# Patient Record
Sex: Female | Born: 1955 | Race: White | Hispanic: No | Marital: Married | Smoking: Former smoker
Health system: Southern US, Community
[De-identification: ages and names within clinical notes are randomized; demographics above are authoritative.]

## PROBLEM LIST (undated history)

## (undated) DIAGNOSIS — K7689 Other specified diseases of liver: Secondary | ICD-10-CM

## (undated) DIAGNOSIS — K649 Unspecified hemorrhoids: Secondary | ICD-10-CM

## (undated) DIAGNOSIS — H269 Unspecified cataract: Secondary | ICD-10-CM

## (undated) DIAGNOSIS — M1990 Unspecified osteoarthritis, unspecified site: Secondary | ICD-10-CM

## (undated) DIAGNOSIS — R202 Paresthesia of skin: Secondary | ICD-10-CM

## (undated) DIAGNOSIS — F329 Major depressive disorder, single episode, unspecified: Secondary | ICD-10-CM

## (undated) DIAGNOSIS — D126 Benign neoplasm of colon, unspecified: Secondary | ICD-10-CM

## (undated) DIAGNOSIS — L719 Rosacea, unspecified: Secondary | ICD-10-CM

## (undated) DIAGNOSIS — N3010 Interstitial cystitis (chronic) without hematuria: Secondary | ICD-10-CM

## (undated) DIAGNOSIS — E785 Hyperlipidemia, unspecified: Secondary | ICD-10-CM

## (undated) DIAGNOSIS — K219 Gastro-esophageal reflux disease without esophagitis: Secondary | ICD-10-CM

## (undated) DIAGNOSIS — T7840XA Allergy, unspecified, initial encounter: Secondary | ICD-10-CM

## (undated) DIAGNOSIS — R55 Syncope and collapse: Secondary | ICD-10-CM

## (undated) DIAGNOSIS — R918 Other nonspecific abnormal finding of lung field: Secondary | ICD-10-CM

## (undated) DIAGNOSIS — K5900 Constipation, unspecified: Secondary | ICD-10-CM

## (undated) DIAGNOSIS — R2681 Unsteadiness on feet: Secondary | ICD-10-CM

## (undated) DIAGNOSIS — D131 Benign neoplasm of stomach: Secondary | ICD-10-CM

## (undated) DIAGNOSIS — F3289 Other specified depressive episodes: Secondary | ICD-10-CM

## (undated) DIAGNOSIS — G43909 Migraine, unspecified, not intractable, without status migrainosus: Secondary | ICD-10-CM

## (undated) DIAGNOSIS — R200 Anesthesia of skin: Secondary | ICD-10-CM

## (undated) DIAGNOSIS — K589 Irritable bowel syndrome without diarrhea: Secondary | ICD-10-CM

## (undated) DIAGNOSIS — F419 Anxiety disorder, unspecified: Secondary | ICD-10-CM

## (undated) DIAGNOSIS — D649 Anemia, unspecified: Secondary | ICD-10-CM

## (undated) DIAGNOSIS — N329 Bladder disorder, unspecified: Secondary | ICD-10-CM

## (undated) HISTORY — DX: Anesthesia of skin: R20.0

## (undated) HISTORY — DX: Benign neoplasm of stomach: D13.1

## (undated) HISTORY — DX: Gastro-esophageal reflux disease without esophagitis: K21.9

## (undated) HISTORY — DX: Rosacea, unspecified: L71.9

## (undated) HISTORY — DX: Interstitial cystitis (chronic) without hematuria: N30.10

## (undated) HISTORY — PX: LAPAROSCOPY: SHX197

## (undated) HISTORY — DX: Anesthesia of skin: R20.2

## (undated) HISTORY — DX: Benign neoplasm of colon, unspecified: D12.6

## (undated) HISTORY — PX: OOPHORECTOMY: SHX86

## (undated) HISTORY — DX: Migraine, unspecified, not intractable, without status migrainosus: G43.909

## (undated) HISTORY — DX: Anemia, unspecified: D64.9

## (undated) HISTORY — PX: UPPER GASTROINTESTINAL ENDOSCOPY: SHX188

## (undated) HISTORY — DX: Irritable bowel syndrome, unspecified: K58.9

## (undated) HISTORY — PX: CYSTECTOMY: SHX5119

## (undated) HISTORY — DX: Major depressive disorder, single episode, unspecified: F32.9

## (undated) HISTORY — DX: Unspecified hemorrhoids: K64.9

## (undated) HISTORY — DX: Allergy, unspecified, initial encounter: T78.40XA

## (undated) HISTORY — DX: Anxiety disorder, unspecified: F41.9

## (undated) HISTORY — DX: Unspecified cataract: H26.9

## (undated) HISTORY — DX: Bladder disorder, unspecified: N32.9

## (undated) HISTORY — DX: Other nonspecific abnormal finding of lung field: R91.8

## (undated) HISTORY — DX: Hyperlipidemia, unspecified: E78.5

## (undated) HISTORY — DX: Other specified diseases of liver: K76.89

## (undated) HISTORY — PX: DILATION AND CURETTAGE OF UTERUS: SHX78

## (undated) HISTORY — DX: Syncope and collapse: R55

## (undated) HISTORY — PX: VAGINAL HYSTERECTOMY: SUR661

## (undated) HISTORY — DX: Constipation, unspecified: K59.00

## (undated) HISTORY — DX: Unspecified osteoarthritis, unspecified site: M19.90

## (undated) HISTORY — PX: OTHER SURGICAL HISTORY: SHX169

## (undated) HISTORY — DX: Other specified depressive episodes: F32.89

## (undated) HISTORY — DX: Unsteadiness on feet: R26.81

---

## 1997-12-04 ENCOUNTER — Other Ambulatory Visit: Admission: RE | Admit: 1997-12-04 | Discharge: 1997-12-04 | Payer: Self-pay | Admitting: *Deleted

## 1997-12-18 ENCOUNTER — Other Ambulatory Visit: Admission: RE | Admit: 1997-12-18 | Discharge: 1997-12-18 | Payer: Self-pay | Admitting: *Deleted

## 1998-02-27 ENCOUNTER — Other Ambulatory Visit: Admission: RE | Admit: 1998-02-27 | Discharge: 1998-02-27 | Payer: Self-pay | Admitting: *Deleted

## 1999-07-07 ENCOUNTER — Ambulatory Visit (HOSPITAL_BASED_OUTPATIENT_CLINIC_OR_DEPARTMENT_OTHER): Admission: RE | Admit: 1999-07-07 | Discharge: 1999-07-07 | Payer: Self-pay | Admitting: Orthopedic Surgery

## 1999-07-26 ENCOUNTER — Other Ambulatory Visit: Admission: RE | Admit: 1999-07-26 | Discharge: 1999-07-26 | Payer: Self-pay | Admitting: *Deleted

## 1999-07-26 ENCOUNTER — Encounter: Admission: RE | Admit: 1999-07-26 | Discharge: 1999-07-26 | Payer: Self-pay | Admitting: *Deleted

## 1999-07-26 ENCOUNTER — Encounter: Payer: Self-pay | Admitting: *Deleted

## 2000-07-25 ENCOUNTER — Encounter: Admission: RE | Admit: 2000-07-25 | Discharge: 2000-07-25 | Payer: Self-pay | Admitting: *Deleted

## 2000-07-25 ENCOUNTER — Other Ambulatory Visit: Admission: RE | Admit: 2000-07-25 | Discharge: 2000-07-25 | Payer: Self-pay | Admitting: *Deleted

## 2000-07-25 ENCOUNTER — Encounter: Payer: Self-pay | Admitting: *Deleted

## 2001-06-20 ENCOUNTER — Encounter: Payer: Self-pay | Admitting: *Deleted

## 2001-06-20 ENCOUNTER — Encounter: Admission: RE | Admit: 2001-06-20 | Discharge: 2001-06-20 | Payer: Self-pay | Admitting: *Deleted

## 2001-07-30 ENCOUNTER — Other Ambulatory Visit: Admission: RE | Admit: 2001-07-30 | Discharge: 2001-07-30 | Payer: Self-pay | Admitting: *Deleted

## 2001-08-10 ENCOUNTER — Other Ambulatory Visit: Admission: RE | Admit: 2001-08-10 | Discharge: 2001-08-10 | Payer: Self-pay | Admitting: *Deleted

## 2002-07-30 ENCOUNTER — Encounter: Admission: RE | Admit: 2002-07-30 | Discharge: 2002-07-30 | Payer: Self-pay | Admitting: *Deleted

## 2002-07-30 ENCOUNTER — Encounter: Payer: Self-pay | Admitting: *Deleted

## 2002-07-30 ENCOUNTER — Other Ambulatory Visit: Admission: RE | Admit: 2002-07-30 | Discharge: 2002-07-30 | Payer: Self-pay | Admitting: *Deleted

## 2002-12-16 ENCOUNTER — Ambulatory Visit (HOSPITAL_COMMUNITY): Admission: RE | Admit: 2002-12-16 | Discharge: 2002-12-16 | Payer: Self-pay | Admitting: Gastroenterology

## 2002-12-16 ENCOUNTER — Encounter: Payer: Self-pay | Admitting: Gastroenterology

## 2002-12-20 ENCOUNTER — Encounter: Payer: Self-pay | Admitting: Gastroenterology

## 2003-07-11 ENCOUNTER — Other Ambulatory Visit: Admission: RE | Admit: 2003-07-11 | Discharge: 2003-07-11 | Payer: Self-pay | Admitting: *Deleted

## 2003-07-14 ENCOUNTER — Encounter: Payer: Self-pay | Admitting: Gastroenterology

## 2003-07-14 ENCOUNTER — Encounter: Admission: RE | Admit: 2003-07-14 | Discharge: 2003-07-14 | Payer: Self-pay | Admitting: *Deleted

## 2003-12-29 ENCOUNTER — Ambulatory Visit (HOSPITAL_COMMUNITY): Admission: RE | Admit: 2003-12-29 | Discharge: 2003-12-29 | Payer: Self-pay | Admitting: Gastroenterology

## 2004-07-20 ENCOUNTER — Other Ambulatory Visit: Admission: RE | Admit: 2004-07-20 | Discharge: 2004-07-20 | Payer: Self-pay | Admitting: *Deleted

## 2004-07-28 ENCOUNTER — Ambulatory Visit (HOSPITAL_COMMUNITY): Admission: RE | Admit: 2004-07-28 | Discharge: 2004-07-28 | Payer: Self-pay | Admitting: *Deleted

## 2004-09-07 ENCOUNTER — Ambulatory Visit: Payer: Self-pay | Admitting: Gastroenterology

## 2004-09-10 ENCOUNTER — Ambulatory Visit (HOSPITAL_COMMUNITY): Admission: RE | Admit: 2004-09-10 | Discharge: 2004-09-10 | Payer: Self-pay | Admitting: Gastroenterology

## 2004-09-17 ENCOUNTER — Ambulatory Visit: Payer: Self-pay | Admitting: Gastroenterology

## 2004-09-18 ENCOUNTER — Ambulatory Visit (HOSPITAL_COMMUNITY): Admission: RE | Admit: 2004-09-18 | Discharge: 2004-09-18 | Payer: Self-pay | Admitting: Gastroenterology

## 2004-10-12 ENCOUNTER — Ambulatory Visit: Payer: Self-pay | Admitting: Gastroenterology

## 2004-11-18 ENCOUNTER — Encounter: Payer: Self-pay | Admitting: Gastroenterology

## 2004-12-31 ENCOUNTER — Ambulatory Visit: Payer: Self-pay | Admitting: Gastroenterology

## 2005-03-22 ENCOUNTER — Ambulatory Visit: Payer: Self-pay | Admitting: Gastroenterology

## 2005-05-31 ENCOUNTER — Ambulatory Visit: Payer: Self-pay | Admitting: Gastroenterology

## 2005-07-26 ENCOUNTER — Other Ambulatory Visit: Admission: RE | Admit: 2005-07-26 | Discharge: 2005-07-26 | Payer: Self-pay | Admitting: *Deleted

## 2005-08-02 ENCOUNTER — Ambulatory Visit (HOSPITAL_COMMUNITY): Admission: RE | Admit: 2005-08-02 | Discharge: 2005-08-02 | Payer: Self-pay | Admitting: *Deleted

## 2006-03-14 ENCOUNTER — Ambulatory Visit: Payer: Self-pay | Admitting: Gastroenterology

## 2006-03-21 ENCOUNTER — Ambulatory Visit: Payer: Self-pay | Admitting: Gastroenterology

## 2006-04-28 ENCOUNTER — Ambulatory Visit: Payer: Self-pay | Admitting: Gastroenterology

## 2006-04-28 ENCOUNTER — Encounter (INDEPENDENT_AMBULATORY_CARE_PROVIDER_SITE_OTHER): Payer: Self-pay | Admitting: *Deleted

## 2006-07-25 ENCOUNTER — Ambulatory Visit (HOSPITAL_COMMUNITY): Admission: RE | Admit: 2006-07-25 | Discharge: 2006-07-25 | Payer: Self-pay | Admitting: *Deleted

## 2006-07-25 ENCOUNTER — Other Ambulatory Visit: Admission: RE | Admit: 2006-07-25 | Discharge: 2006-07-25 | Payer: Self-pay | Admitting: *Deleted

## 2006-07-25 ENCOUNTER — Encounter: Payer: Self-pay | Admitting: Gastroenterology

## 2007-04-27 ENCOUNTER — Ambulatory Visit: Payer: Self-pay | Admitting: Gastroenterology

## 2007-04-27 LAB — CONVERTED CEMR LAB
Albumin: 4.1 g/dL (ref 3.5–5.2)
Amylase: 95 units/L (ref 27–131)
Basophils Absolute: 0 10*3/uL (ref 0.0–0.1)
Bilirubin, Direct: 0.1 mg/dL (ref 0.0–0.3)
Eosinophils Absolute: 0 10*3/uL (ref 0.0–0.6)
GFR calc Af Amer: 96 mL/min
GFR calc non Af Amer: 80 mL/min
Glucose, Bld: 69 mg/dL — ABNORMAL LOW (ref 70–99)
HCT: 37.4 % (ref 36.0–46.0)
Hemoglobin: 12.6 g/dL (ref 12.0–15.0)
Lymphocytes Relative: 37.8 % (ref 12.0–46.0)
MCHC: 33.7 g/dL (ref 30.0–36.0)
MCV: 84.6 fL (ref 78.0–100.0)
Monocytes Absolute: 0.7 10*3/uL (ref 0.2–0.7)
Neutro Abs: 3.3 10*3/uL (ref 1.4–7.7)
Neutrophils Relative %: 50.6 % (ref 43.0–77.0)
Potassium: 5.2 meq/L — ABNORMAL HIGH (ref 3.5–5.1)
RBC: 4.42 M/uL (ref 3.87–5.11)
Sed Rate: 20 mm/hr (ref 0–25)
Sodium: 142 meq/L (ref 135–145)
TSH: 0.91 microintl units/mL (ref 0.35–5.50)

## 2007-05-02 ENCOUNTER — Ambulatory Visit (HOSPITAL_COMMUNITY): Admission: RE | Admit: 2007-05-02 | Discharge: 2007-05-02 | Payer: Self-pay | Admitting: Gastroenterology

## 2007-07-18 ENCOUNTER — Other Ambulatory Visit: Admission: RE | Admit: 2007-07-18 | Discharge: 2007-07-18 | Payer: Self-pay | Admitting: *Deleted

## 2007-07-18 ENCOUNTER — Ambulatory Visit (HOSPITAL_COMMUNITY): Admission: RE | Admit: 2007-07-18 | Discharge: 2007-07-18 | Payer: Self-pay | Admitting: *Deleted

## 2007-10-22 DIAGNOSIS — K5900 Constipation, unspecified: Secondary | ICD-10-CM | POA: Insufficient documentation

## 2007-10-22 DIAGNOSIS — K219 Gastro-esophageal reflux disease without esophagitis: Secondary | ICD-10-CM | POA: Insufficient documentation

## 2007-10-22 DIAGNOSIS — N3010 Interstitial cystitis (chronic) without hematuria: Secondary | ICD-10-CM | POA: Insufficient documentation

## 2007-10-22 DIAGNOSIS — K589 Irritable bowel syndrome without diarrhea: Secondary | ICD-10-CM | POA: Insufficient documentation

## 2007-10-22 DIAGNOSIS — F32A Depression, unspecified: Secondary | ICD-10-CM | POA: Insufficient documentation

## 2007-10-22 DIAGNOSIS — K7689 Other specified diseases of liver: Secondary | ICD-10-CM | POA: Insufficient documentation

## 2007-10-22 DIAGNOSIS — F329 Major depressive disorder, single episode, unspecified: Secondary | ICD-10-CM

## 2008-05-23 ENCOUNTER — Encounter: Payer: Self-pay | Admitting: Gastroenterology

## 2008-07-07 ENCOUNTER — Telehealth: Payer: Self-pay | Admitting: Gastroenterology

## 2008-07-14 ENCOUNTER — Other Ambulatory Visit: Admission: RE | Admit: 2008-07-14 | Discharge: 2008-07-14 | Payer: Self-pay | Admitting: Gynecology

## 2008-07-14 ENCOUNTER — Encounter: Payer: Self-pay | Admitting: Gastroenterology

## 2008-07-14 ENCOUNTER — Ambulatory Visit (HOSPITAL_COMMUNITY): Admission: RE | Admit: 2008-07-14 | Discharge: 2008-07-14 | Payer: Self-pay | Admitting: Gynecology

## 2008-08-12 ENCOUNTER — Ambulatory Visit (HOSPITAL_COMMUNITY): Admission: RE | Admit: 2008-08-12 | Discharge: 2008-08-12 | Payer: Self-pay | Admitting: Gastroenterology

## 2008-08-18 ENCOUNTER — Telehealth: Payer: Self-pay | Admitting: Gastroenterology

## 2008-08-18 DIAGNOSIS — R1032 Left lower quadrant pain: Secondary | ICD-10-CM | POA: Insufficient documentation

## 2008-08-19 ENCOUNTER — Ambulatory Visit: Payer: Self-pay | Admitting: Gastroenterology

## 2008-08-29 ENCOUNTER — Ambulatory Visit: Payer: Self-pay | Admitting: Gastroenterology

## 2008-10-29 ENCOUNTER — Ambulatory Visit (HOSPITAL_COMMUNITY): Admission: RE | Admit: 2008-10-29 | Discharge: 2008-10-29 | Payer: Self-pay | Admitting: Obstetrics and Gynecology

## 2008-10-29 ENCOUNTER — Encounter (INDEPENDENT_AMBULATORY_CARE_PROVIDER_SITE_OTHER): Payer: Self-pay | Admitting: Obstetrics and Gynecology

## 2009-07-20 ENCOUNTER — Ambulatory Visit (HOSPITAL_COMMUNITY): Admission: RE | Admit: 2009-07-20 | Discharge: 2009-07-20 | Payer: Self-pay | Admitting: Gynecology

## 2009-07-20 ENCOUNTER — Telehealth: Payer: Self-pay | Admitting: Gastroenterology

## 2009-07-20 ENCOUNTER — Encounter: Payer: Self-pay | Admitting: Gastroenterology

## 2009-08-17 ENCOUNTER — Ambulatory Visit (HOSPITAL_COMMUNITY): Admission: RE | Admit: 2009-08-17 | Discharge: 2009-08-17 | Payer: Self-pay | Admitting: Gastroenterology

## 2009-09-08 ENCOUNTER — Ambulatory Visit: Payer: Self-pay | Admitting: Gastroenterology

## 2009-09-09 LAB — CONVERTED CEMR LAB
AST: 25 units/L (ref 0–37)
Albumin: 4.4 g/dL (ref 3.5–5.2)
Alkaline Phosphatase: 74 units/L (ref 39–117)
Basophils Absolute: 0 10*3/uL (ref 0.0–0.1)
Bilirubin, Direct: 0 mg/dL (ref 0.0–0.3)
CO2: 33 meq/L — ABNORMAL HIGH (ref 19–32)
Calcium: 10 mg/dL (ref 8.4–10.5)
Eosinophils Relative: 0.8 % (ref 0.0–5.0)
Ferritin: 4.6 ng/mL — ABNORMAL LOW (ref 10.0–291.0)
Folate: 10.2 ng/mL
GFR calc non Af Amer: 92.15 mL/min (ref >60)
Glucose, Bld: 86 mg/dL (ref 70–99)
Lymphs Abs: 2.1 10*3/uL (ref 0.7–4.0)
Monocytes Absolute: 0.4 10*3/uL (ref 0.1–1.0)
Monocytes Relative: 6.8 % (ref 3.0–12.0)
Neutrophils Relative %: 55.7 % (ref 43.0–77.0)
Platelets: 243 10*3/uL (ref 150.0–400.0)
Potassium: 4.8 meq/L (ref 3.5–5.1)
RDW: 16.9 % — ABNORMAL HIGH (ref 11.5–14.6)
Saturation Ratios: 8 % — ABNORMAL LOW (ref 20.0–50.0)
Sed Rate: 28 mm/hr — ABNORMAL HIGH (ref 0–22)
Sodium: 141 meq/L (ref 135–145)
Transferrin: 356 mg/dL (ref 212.0–360.0)
WBC: 5.8 10*3/uL (ref 4.5–10.5)

## 2009-09-11 ENCOUNTER — Telehealth: Payer: Self-pay | Admitting: Gastroenterology

## 2009-09-15 ENCOUNTER — Telehealth: Payer: Self-pay | Admitting: Gastroenterology

## 2009-10-06 ENCOUNTER — Ambulatory Visit: Payer: Self-pay | Admitting: Gastroenterology

## 2009-10-06 DIAGNOSIS — K5909 Other constipation: Secondary | ICD-10-CM | POA: Insufficient documentation

## 2009-10-06 LAB — CONVERTED CEMR LAB
ALT: 18 units/L (ref 0–35)
Alkaline Phosphatase: 72 units/L (ref 39–117)
Basophils Absolute: 0 10*3/uL (ref 0.0–0.1)
Bilirubin, Direct: 0.1 mg/dL (ref 0.0–0.3)
CO2: 31 meq/L (ref 19–32)
Chloride: 101 meq/L (ref 96–112)
Creatinine, Ser: 0.7 mg/dL (ref 0.4–1.2)
Eosinophils Absolute: 0.1 10*3/uL (ref 0.0–0.7)
Iron: 46 ug/dL (ref 42–145)
Lymphocytes Relative: 29.8 % (ref 12.0–46.0)
MCHC: 32.1 g/dL (ref 30.0–36.0)
Monocytes Relative: 6.7 % (ref 3.0–12.0)
Neutro Abs: 3.4 10*3/uL (ref 1.4–7.7)
Neutrophils Relative %: 62.1 % (ref 43.0–77.0)
Platelets: 299 10*3/uL (ref 150.0–400.0)
Potassium: 4.6 meq/L (ref 3.5–5.1)
RDW: 18.5 % — ABNORMAL HIGH (ref 11.5–14.6)
Saturation Ratios: 11 % — ABNORMAL LOW (ref 20.0–50.0)
Sed Rate: 34 mm/hr — ABNORMAL HIGH (ref 0–22)
Sodium: 139 meq/L (ref 135–145)
Total Bilirubin: 0.5 mg/dL (ref 0.3–1.2)
Transferrin: 297.4 mg/dL (ref 212.0–360.0)
Vitamin B-12: 1045 pg/mL — ABNORMAL HIGH (ref 211–911)

## 2009-11-06 ENCOUNTER — Ambulatory Visit: Payer: Self-pay | Admitting: Gastroenterology

## 2009-11-06 DIAGNOSIS — K900 Celiac disease: Secondary | ICD-10-CM | POA: Insufficient documentation

## 2009-12-15 ENCOUNTER — Telehealth: Payer: Self-pay | Admitting: Gastroenterology

## 2010-07-15 ENCOUNTER — Ambulatory Visit (HOSPITAL_COMMUNITY)
Admission: RE | Admit: 2010-07-15 | Discharge: 2010-07-15 | Payer: Self-pay | Source: Home / Self Care | Attending: Gynecology | Admitting: Gynecology

## 2010-08-11 ENCOUNTER — Telehealth: Payer: Self-pay | Admitting: Gastroenterology

## 2010-08-13 ENCOUNTER — Ambulatory Visit (HOSPITAL_COMMUNITY)
Admission: RE | Admit: 2010-08-13 | Discharge: 2010-08-13 | Payer: Self-pay | Source: Home / Self Care | Attending: Gastroenterology | Admitting: Gastroenterology

## 2010-08-29 ENCOUNTER — Encounter: Payer: Self-pay | Admitting: Gynecology

## 2010-09-07 NOTE — Progress Notes (Signed)
Summary: resend refill  Phone Note Call from Patient Call back at Work Phone [phone] Call back at [phone]   Caller: Patient Call For: Jarold Motto Reason for Call: Talk to Nurse Summary of Call: Patient states that rx she requested on Tues. is till not at her pharmacy, please resend.  [phone] [facility] Initial call taken by: Tawni Levy,  September 11, 2009 8:37 AM  Follow-up for Phone Call        Left messsage for pt to call. Lupita Leash Surface RN  September 11, 2009 8:42 AM  [facility] not get any of the prescriptions that were sent earlier this week.  Request we send them again.  Req we fax RX  to [phone]. This has been done. Follow-up by: Ashok Cordia RN,  September 11, 2009 12:08 PM    Prescriptions: TANDEM 162-115.2 MG CAPS (FERROUS FUM-IRON POLYSACCH) 1 by mouth qd  #90 x 3   Entered by:   Ashok Cordia RN   Authorized by:   Mardella Layman MD [facility]   Signed by:   Ashok Cordia RN on 09/11/2009   Method used:   Printed then faxed to ...       [facility] # [phone]* (retail)       [address]       [place], [place]  [address]       Ph: [phone]       Fax: [phone]   RxID:   [id number] KLONOPIN 0.5 MG TABS (CLONAZEPAM) 1 tablet by mouth bid  #180 x 3   Entered by:   Ashok Cordia RN   Authorized by:   Mardella Layman MD [facility]   Signed by:   Ashok Cordia RN on 09/11/2009   Method used:   Printed then faxed to ...       [facility] # [phone]* (retail)       [address]       [place], [place]  [address]       Ph: [phone]       Fax: [phone]   RxID:   [id number] LEXAPRO 20 MG TABS (ESCITALOPRAM OXALATE) 1 tablet by mouth once daily  #90 x 3   Entered by:   Ashok Cordia RN   Authorized by:   Mardella Layman MD [facility]   Signed by:   Ashok Cordia RN on 09/11/2009   Method used:   Printed then faxed to ...       [facility] # [phone]* (retail)       [address]       [place],  [place]  [address]       Ph: [phone]       Fax: [phone]   RxID:   [id number]

## 2010-09-07 NOTE — Assessment & Plan Note (Signed)
Summary: Recheck/dfs   History of Present Illness Visit Type: Follow-up Visit Primary GI MD: Sheryn Bison MD FACP FAGA Primary Provider: n/a Chief Complaint: Patient comes for f/u on gerd and constipation. Patient states that she is still experiencing increased GERD (thinks maybe it is because she has been eating lots of citrus fruits). She also c/o continued constipation with increased gas. History of Present Illness:   Since her last visit this patient has had laparoscopy with lysis of adhesions and removal of her left ovary. Her constipation has improved but she continues on daily Amitiza 8 micrograms with p.r.n. MiraLax and Dulcolax. She recently tried to decrease her Lexapro from 20-10 mg a day and has had resultant arthralgias and myalgias and general malaise. She does take daily Aleve and other NSAIDs but is on Protonix 40 mg a day. For chronic anxiety syndrome she takes Klonopin 0.5 mg one to 2 times a day. She continues to have some problems with acid reflux but denies dysphagia or any specific hepatobiliary complaints. Recent repeat upper abdominal ultrasound exam shows a stable hepatic cyst.   GI Review of Systems    Reports abdominal pain, acid reflux, belching, bloating, chest pain, dysphagia with solids, heartburn, loss of appetite, and  nausea.     Location of  Abdominal pain: lower abdomen.    Denies dysphagia with liquids, vomiting, vomiting blood, weight loss, and  weight gain.      Reports constipation and  diverticulosis.     Denies anal fissure, black tarry stools, change in bowel habit, diarrhea, fecal incontinence, heme positive stool, hemorrhoids, irritable bowel syndrome, jaundice, light color stool, liver problems, rectal bleeding, and  rectal pain.    Current Medications (verified): 1)  Protonix 40 Mg  Tbec (Pantoprazole Sodium) .Marland Kitchen.. 1 Twice A Day 30 Minutes Before Meals 2)  Lexapro 20 Mg Tabs (Escitalopram Oxalate) .Marland Kitchen.. 1 Tablet By Mouth Once Daily 3)  Klonopin  0.5 Mg Tabs (Clonazepam) .Marland Kitchen.. 1 Tablet By Mouth Once Daily 4)  Miralax  Powd (Polyethylene Glycol 3350) .... Mix 1 Capful in Water At Bedtime 5)  Dulcolax 5 Mg Tbec (Bisacodyl) .... As Needed 6)  Amitiza 8 Mcg  Caps (Lubiprostone) .Marland Kitchen.. 1 Two Times A Day/take With Food and Water 7)  Aleve 220 Mg Caps (Naproxen Sodium) .... As Needed 8)  Meloxicam 15 Mg Tabs (Meloxicam) .... Take 1 Tablet By Mouth As Needed  Allergies (verified): 1)  ! Asa  Past History:  Past medical, surgical, family and social histories (including risk factors) reviewed for relevance to current acute and chronic problems.  Past Medical History: Reviewed history from 08/18/2008 and no changes required. 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 Oophorectomy  Family History: Reviewed history from 08/19/2008 and no changes required. Family History of Heart Disease: Mother Family History of Bladder Cancer: Mother Family History of Pancreatic Cancer: Paternal Grandfather Family History of Diabetes: Paternal Grandmother No FH of Colon Cancer:  Social History: Reviewed history from 08/19/2008 and no changes required. Patient is a former smoker. -stopped at age 60. Illicit Drug Use - no Occupation: Solicitor of Social Services Alcohol Use - yes-1 weekly Daily Caffeine Use Patient gets regular exercise.  Review of Systems       The patient complains of anemia, arthritis/joint pain, back pain, depression-new, fatigue, headaches-new, heart rhythm changes,  muscle pains/cramps, shortness of breath, and sleeping problems.    Vital Signs:  Patient profile:   55 year old female Height:      66 inches Weight:      162.38 pounds BMI:     26.30 BSA:      1.83 Pulse rate:   72 / minute Pulse rhythm:   regular BP sitting:   140 / 76  (right arm)  Vitals Entered By: Hortense Ramal CMA [place] [facility]) (September 08, 2009 10:35 AM)  Physical Exam  General:  Well developed, well nourished, no acute distress.healthy appearing.   Head:  Normocephalic and atraumatic. Eyes:  exam deferred to patient's ophthalmologist.   Neck:  Supple; no masses or thyromegaly. Lungs:  Clear throughout to auscultation. Heart:  Regular rate and rhythm; no murmurs, rubs,  or bruits. Abdomen:  Soft, nontender and nondistended. No masses, hepatosplenomegaly or hernias noted. Normal bowel sounds. Msk:  Symmetrical with no gross deformities. Normal posture. Pulses:  Normal pulses noted. Extremities:  No clubbing, cyanosis, edema or deformities noted. Neurologic:  Alert and  oriented x4;  grossly normal neurologically. Cervical Nodes:  No significant cervical adenopathy. Inguinal Nodes:  No significant inguinal adenopathy. Psych:  Alert and cooperative. Normal mood and affect.   Impression & Recommendations:  Problem # 1:  ABDOMINAL PAIN, LEFT LOWER QUADRANT (ICD-789.04) Assessment Improved Continue constipation regime of all medications as listed and we'll try [facility] b.i.d. Labs have been ordered for review.  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  Problem # 3:  DEPRESSION (ICD-311) Assessment: Deteriorated Increase Lexapro to 20 mg a day with p.r.n. Klonopin use. Orders: TLB-CBC Platelet - w/Differential (85025-CBCD) TLB-BMP (Basic Metabolic Panel-BMET) (80048-METABOL) TLB-Hepatic/Liver Function Pnl (80076-HEPATIC) TLB-TSH (Thyroid Stimulating Hormone) (84443-TSH) TLB-B12, Serum-Total ONLY (21308-M57) TLB-Ferritin (82728-FER) TLB-Folic Acid (Folate) (82746-FOL) TLB-IBC Pnl (Iron/FE;Transferrin) (83550-IBC) TLB-Sedimentation Rate (ESR) (85652-ESR)  Problem # 4:  GERD (ICD-530.81) Assessment:  Improved Continue Reflux regime and daily PPI therapy. Orders: TLB-CBC Platelet - w/Differential (85025-CBCD) TLB-BMP (Basic Metabolic Panel-BMET) (80048-METABOL) TLB-Hepatic/Liver Function Pnl (80076-HEPATIC) TLB-TSH (Thyroid Stimulating Hormone) (84443-TSH) TLB-B12, Serum-Total ONLY (84696-E95) TLB-Ferritin (82728-FER) TLB-Folic Acid (Folate) (82746-FOL) TLB-IBC Pnl (Iron/FE;Transferrin) (83550-IBC) TLB-Sedimentation Rate (ESR) (85652-ESR)  Patient Instructions: 1)  Refills will be sent to your pharmacy. 2)  Please schedule a follow-up appointment in 1 month.  3)  Labs will be drawn today. 4)  The medication list was reviewed and reconciled.  All changed / newly prescribed medications were explained.  A complete medication list was provided to the patient / caregiver. 5)  Please continue current medications.  6)  Avoid foods high in acid content ( tomatoes, citrus juices, spicy foods) . Avoid eating within 3 to 4 hours of lying down or before exercising. Do not over eat; try smaller more frequent meals. Elevate head of bed four inches when sleeping.  7)  Constipation and Hemorrhoids brochure given.   Appended Document: Recheck/dfs    Clinical Lists Changes  Medications: Changed medication from AMITIZA 8 MCG  CAPS (LUBIPROSTONE) 1 two times a day/take with food and water to AMITIZA 8 MCG  CAPS (LUBIPROSTONE) 1 two times a day/take with food and water - Signed Changed medication from LEXAPRO 20 MG TABS (ESCITALOPRAM OXALATE) 1 tablet by mouth once daily to LEXAPRO 20 MG TABS (ESCITALOPRAM OXALATE) 1 tablet by mouth once daily - Signed Changed medication from KLONOPIN 0.5 MG TABS (CLONAZEPAM) 1 tablet by mouth once daily to KLONOPIN 0.5 MG TABS (CLONAZEPAM) 1 tablet  by mouth once daily - Signed Changed medication from KLONOPIN 0.5 MG TABS (CLONAZEPAM) 1 tablet by mouth once daily to KLONOPIN 0.5 MG TABS (CLONAZEPAM) 1 tablet by mouth bid - Signed Rx of AMITIZA 8 MCG  CAPS  (LUBIPROSTONE) 1 two times a day/take with food and water;  #60 x 11;  Signed;  Entered by: Ashok Cordia RN;  Authorized by: Mardella Layman MD [facility];  Method used: Electronically to [facility] # [phone]*, [address], [place], [place]  [address], Ph: [phone], Fax: [phone] Rx of LEXAPRO 20 MG TABS (ESCITALOPRAM OXALATE) 1 tablet by mouth once daily;  #90 x 3;  Signed;  Entered by: Ashok Cordia RN;  Authorized by: Mardella Layman MD [facility];  Method used: Faxed to [facility], , , [place]  , Ph: [phone], Fax: [phone] Rx of KLONOPIN 0.5 MG TABS (CLONAZEPAM) 1 tablet by mouth once daily;  #180 x 3;  Signed;  Entered by: Ashok Cordia RN;  Authorized by: Mardella Layman MD [facility];  Method used: Printed then faxed to [facility], , ,   , Ph: [phone], Fax: [phone] Rx of KLONOPIN 0.5 MG TABS (CLONAZEPAM) 1 tablet by mouth bid;  #180 x 3;  Signed;  Entered by: Ashok Cordia RN;  Authorized by: Mardella Layman MD [facility];  Method used: Printed then faxed to [facility], , , [place]  , Ph: [phone], Fax: [phone]    Prescriptions: Scarlette Calico 0.5 MG TABS (CLONAZEPAM) 1 tablet by mouth bid  #180 x 3   Entered by:   Ashok Cordia RN   Authorized by:   Mardella Layman MD [facility]   Signed by:   Ashok Cordia RN on 09/08/2009   Method used:   Printed then faxed to ...       [facility] [employer] [employer])             , [place]         Ph: [phone]       Fax: [phone]   RxID:   [id number] KLONOPIN 0.5 MG TABS (CLONAZEPAM) 1 tablet by mouth once daily  #180 x 3   Entered by:   Ashok Cordia RN   Authorized by:   Mardella Layman MD [facility]   Signed by:   Ashok Cordia RN on 09/08/2009   Method used:   Printed then faxed to ...       [facility] [employer])             , [place]         Ph: [phone]       Fax: [phone]   RxID:   [id number] LEXAPRO 20 MG TABS (ESCITALOPRAM OXALATE) 1 tablet by mouth once daily  #90 x 3   Entered by:   Ashok Cordia  RN   Authorized by:   Mardella Layman MD [facility]   Signed by:   Ashok Cordia RN on 09/08/2009   Method used:   Faxed to ...       [facility] [employer])             , [place]         Ph: [phone]       Fax: [phone]   RxID:   [id number] AMITIZA 8 MCG  CAPS (LUBIPROSTONE) 1 two times a day/take with food and water  #60 x 11   Entered by:   Ashok Cordia RN   Authorized by:   Mardella Layman MD [facility]   Signed  by:   Ashok Cordia RN on 09/08/2009   Method used:   Electronically to        [facility] # [id number]* (retail)       [address]       [place], [place]  [address]       Ph: [phone]       Fax: [phone]   RxID:   [id number]   Klonopin rx printed in error.  two times a day is correct dose.

## 2010-09-07 NOTE — Assessment & Plan Note (Signed)
Summary: F/U APPT...LSW.   History of Present Illness Visit Type: Follow-up Visit Primary GI MD: Sheryn Bison MD FACP FAGA Primary Provider: Teodora Medici, MD Requesting Provider: n/a Chief Complaint: f/u constipation. Pt is on a new probiotic from the health food store. Pt states she is doing very good with that. Pt states she has regular stools now almost every day.  History of Present Illness:   55 year old Caucasian female with probable celiac disease per abnormal celiac serology. She had been placed in a gluten-free diet and her GI symptoms have been alleviated. She does take probiotics from a health food store. Other medications currently are Protonix 40 mg twice a day for GERD, Lexapro 20 mg a day, Klonopin 0.5 mg twice a day,p.r.n. Amitiza 8 micrograms b.i.d.   GI Review of Systems      Denies abdominal pain, acid reflux, belching, bloating, chest pain, dysphagia with liquids, dysphagia with solids, heartburn, loss of appetite, nausea, vomiting, vomiting blood, weight loss, and  weight gain.        Denies anal fissure, black tarry stools, change in bowel habit, constipation, diarrhea, diverticulosis, fecal incontinence, heme positive stool, hemorrhoids, irritable bowel syndrome, jaundice, light color stool, liver problems, rectal bleeding, and  rectal pain.    Current Medications (verified): 1)  Protonix 40 Mg  Tbec (Pantoprazole Sodium) .Marland Kitchen.. 1 Twice A Day 30 Minutes Before Meals 2)  Lexapro 20 Mg Tabs (Escitalopram Oxalate) .Marland Kitchen.. 1 Tablet By Mouth Once Daily 3)  Klonopin 0.5 Mg Tabs (Clonazepam) .Marland Kitchen.. 1 Tablet By Mouth Bid 4)  Dulcolax 5 Mg Tbec (Bisacodyl) .... As Needed 5)  Amitiza 8 Mcg  Caps (Lubiprostone) .Marland Kitchen.. 1 Two Times A Day/take With Food and Water 6)  Meloxicam 15 Mg Tabs (Meloxicam) .... Take 1 Tablet By Mouth As Needed 7)  Tandem 162-115.2 Mg Caps (Ferrous Fum-Iron Polysacch) .Marland Kitchen.. 1 By Mouth Qd 8)  First-Bxn Mouthwash  Susp (Diphenhyd-Lidocaine-Nystatin) .... 5 Cc  Swish and Swallow Qid 9)  [facility]  Caps (Probiotic Product) .... Tid 10)  Ultra Complex Probiotic 50 .... One Tablet By Mouth Once Daily  Allergies (verified): 1)  ! Asa  Past History:  Past medical, surgical, family and social histories (including risk factors) reviewed for relevance to current acute and chronic problems.  Past Medical History: Reviewed history from 08/18/2008 and no changes required. Current Problems:  ABDOMINAL PAIN, LEFT LOWER QUADRANT (ICD-789.04) HEPATIC CYST (ICD-573.8) INTERSTITIAL CYSTITIS (ICD-595.1) DEPRESSION (ICD-311) GERD (ICD-530.81) CONSTIPATION (ICD-564.00) IBS (ICD-564.1)  Past Surgical History: Reviewed history from 09/08/2009 and no changes required. milk gland removed  Hysterectomy and laparoscopic lysis of pelvic adhesions. Oophorectomy  Family History: Reviewed history from 09/08/2009 and no changes required. Family History of Heart Disease: Mother Family History of Bladder Cancer: Mother Family History of Pancreatic Cancer: Paternal Grandfather Family History of Diabetes: Paternal Grandmother No FH of Colon Cancer:  Social History: Reviewed history from 09/08/2009 and no changes required. Patient is a former smoker. -stopped at age 48. Illicit Drug Use - no Occupation: Solicitor of Social Services Alcohol Use - yes-1 weekly Daily Caffeine Use Patient gets regular exercise.  Review of Systems  The patient denies allergy/sinus, anemia, anxiety-new, arthritis/joint pain, back pain, blood in urine, breast changes/lumps, change in vision, confusion, cough, coughing up blood, depression-new, fainting, fatigue, fever, headaches-new, hearing problems, heart murmur, heart rhythm changes, itching, menstrual pain, muscle pains/cramps, night sweats, nosebleeds, pregnancy symptoms, shortness of breath, skin rash, sleeping problems, sore throat, swelling of feet/legs, swollen lymph glands, thirst -  excessive , urination - excessive ,  urination changes/pain, urine leakage, vision changes, and voice change.    Vital Signs:  Patient profile:   55 year old female Height:      66 inches Weight:      162.50 pounds BMI:     26.32 Pulse rate:   70 / minute Pulse rhythm:   regular BP sitting:   142 / 70  (right arm) Cuff size:   regular  Vitals Entered By: Christie Nottingham CMA [place] [facility]) (November 06, 2009 9:22 AM)  Physical Exam  General:  Well developed, well nourished, no acute distress.healthy appearing.   Head:  Normocephalic and atraumatic. Eyes:  PERRLA, no icterus.exam deferred to patient's ophthalmologist.   Psych:  Alert and cooperative. Normal mood and affect.   Impression & Recommendations:  Problem # 1:  CELIAC SPRUE (ICD-579.0) Assessment Improved Continue gluten-free diet. Referral to dietary has been offered to the patient but she declined .We have given her permission all celiac disease and a copy of our gluten-free diet. I have asked her to take Caltrate 600 with vitamin D 2 tablets a day, multivitamins, and probiotics per her discretion. She uses Amitiza also on p.r.n. basis.  Problem # 2:  GERD (ICD-530.81) Assessment: Improved Decrease Protonix to 40 mg q.a.m. as tolerated.  Problem # 3:  DEPRESSION (ICD-311) Assessment: Improved continue Lexapro and Klonopin as tolerated.  Patient Instructions: 1)  Begin Caltrate 600 mg two times a day. 2)  Celiac Sprue brochure given.  3)  The medication list was reviewed and reconciled.  All changed / newly prescribed medications were explained.  A complete medication list was provided to the patient / caregiver. 4)  Copy sent to : Dr. Teodora Medici in gynecology. 5)  Please continue current medications.  6)  Please schedule a follow-up appointment as needed.

## 2010-09-07 NOTE — Assessment & Plan Note (Signed)
Summary: 55-MONTH F/U APPT...LSW.   History of Present Illness Visit Type: Follow-up Visit Primary GI MD: Sheryn Bison MD FACP FAGA Primary Provider: Teodora Medici, MD Requesting Provider: n/a Chief Complaint: One month f/u for constipation, and GERD.  Pt states that she feels better but still c/o constipation  History of Present Illness:   She apparently had respire toward flu several weeks ago and took Tamiflu for 5 days. This is worse and her nausea, sense of taste, constipation, shortness of breath, and caused her to be chronically fatigued. Her GI complaints have not changed whatsoever. Medications are listed and reviewed and include twice a day Amitiza 8 micrograms of Protonix 40 mg a day. 4 anxiety depression she is on Lexapro 20 mg a day and Klonopin 0.5 mg twice a day. She has mild iron deficiency we started her on daily tandem. Followup ultrasound showed no progression of her benign liver cyst.  She denies melena or medically easy. I reviewed her records in detail and she is up-to-date on her colonoscopy and endoscopy exams. She previously smoked but does not currently use cigarettes or abuse alcohol or NSAIDs.   GI Review of Systems      Denies abdominal pain, acid reflux, belching, bloating, chest pain, dysphagia with liquids, dysphagia with solids, heartburn, loss of appetite, nausea, vomiting, vomiting blood, weight loss, and  weight gain.      Reports constipation.     Denies anal fissure, black tarry stools, change in bowel habit, diarrhea, diverticulosis, fecal incontinence, heme positive stool, hemorrhoids, irritable bowel syndrome, jaundice, light color stool, liver problems, rectal bleeding, and  rectal pain.    Current Medications (verified): 1)  Protonix 40 Mg  Tbec (Pantoprazole Sodium) .Marland Kitchen.. 1 Twice A Day 30 Minutes Before Meals 2)  Lexapro 20 Mg Tabs (Escitalopram Oxalate) .Marland Kitchen.. 1 Tablet By Mouth Once Daily 3)  Klonopin 0.5 Mg Tabs (Clonazepam) .Marland Kitchen.. 1 Tablet By Mouth  Bid 4)  Dulcolax 5 Mg Tbec (Bisacodyl) .... As Needed 5)  Amitiza 8 Mcg  Caps (Lubiprostone) .Marland Kitchen.. 1 Two Times A Day/take With Food and Water 6)  Meloxicam 15 Mg Tabs (Meloxicam) .... Take 1 Tablet By Mouth As Needed 7)  Tandem 162-115.2 Mg Caps (Ferrous Fum-Iron Polysacch) .Marland Kitchen.. 1 By Mouth Qd  Allergies (verified): 1)  ! Asa  Past History:  Past medical, surgical, family and social histories (including risk factors) reviewed for relevance to current acute and chronic problems.  Past Medical History: Reviewed history from 08/18/2008 and no changes required. Current Problems:  ABDOMINAL PAIN, LEFT LOWER QUADRANT (ICD-789.04) HEPATIC CYST (ICD-573.8) INTERSTITIAL CYSTITIS (ICD-595.1) DEPRESSION (ICD-311) GERD (ICD-530.81) CONSTIPATION (ICD-564.00) IBS (ICD-564.1)  Past Surgical History: Reviewed history from 09/08/2009 and no changes required. milk gland removed  Hysterectomy and laparoscopic lysis of pelvic adhesions. Oophorectomy  Family History: Reviewed history from 09/08/2009 and no changes required. Family History of Heart Disease: Mother Family History of Bladder Cancer: Mother Family History of Pancreatic Cancer: Paternal Grandfather Family History of Diabetes: Paternal Grandmother No FH of Colon Cancer:  Social History: Reviewed history from 09/08/2009 and no changes required. Patient is a former smoker. -stopped at age 43. Illicit Drug Use - no Occupation: S[employer] Alcohol Use - yes-1 weekly Daily Caffeine Use Patient gets regular exercise.  Review of Systems       The patient complains of allergy/sinus, anemia, anxiety-new, arthritis/joint pain, back pain, fatigue, muscle pains/cramps, and sleeping problems.  The patient denies blood in urine, breast changes/lumps, change in vision, confusion,  cough, coughing up blood, depression-new, fainting, fever, headaches-new, hearing problems, heart murmur, heart rhythm changes, itching, menstrual pain,  night sweats, nosebleeds, pregnancy symptoms, shortness of breath, skin rash, sore throat, swelling of feet/legs, swollen lymph glands, thirst - excessive , urination - excessive , urination changes/pain, urine leakage, vision changes, and voice change.         She complains of deconditioning with shortness of breath with exertion, but she denies any specific cardiopulmonary complaints otherwise. Her previous" flulike expiratory problems" seem to have resolved and she does not have purulent sputum. General:  Complains of weakness and malaise; denies fever, chills, sweats, anorexia, fatigue, weight loss, and sleep disorder. CV:  Complains of dyspnea on exertion; denies chest pains, angina, palpitations, syncope, orthopnea, PND, peripheral edema, and claudication. Resp:  Complains of dyspnea with exercise; denies dyspnea at rest, cough, sputum, wheezing, coughing up blood, and pleurisy. GI:  Complains of constipation; denies difficulty swallowing, pain on swallowing, nausea, indigestion/heartburn, vomiting, vomiting blood, abdominal pain, jaundice, gas/bloating, diarrhea, change in bowel habits, bloody BM's, black BMs, and fecal incontinence. Derm:  Denies rash, itching, dry skin, hives, moles, warts, and unhealing ulcers. Neuro:  Denies weakness, paralysis, abnormal sensation, seizures, syncope, tremors, vertigo, transient blindness, frequent falls, frequent headaches, difficulty walking, headache, sciatica, radiculopathy other:, restless legs, memory loss, and confusion. Psych:  Complains of depression and anxiety; denies memory loss, suicidal ideation, hallucinations, paranoia, phobia, and confusion. Heme:  Denies bruising, bleeding, enlarged lymph nodes, and pagophagia. Allergy:  Denies hives, rash, sneezing, hay fever, and recurrent infections.  Vital Signs:  Patient profile:   55 year old female Height:      66 inches Weight:      162 pounds BMI:     26.24 BSA:     1.83 Pulse rate:   68 /  minute Pulse rhythm:   regular BP sitting:   124 / 80  (left arm) Cuff size:   regular  Vitals Entered By: Ok Anis CMA (October 06, 2009 9:37 AM)  Physical Exam  General:  Well developed, well nourished, no acute distress.healthy appearing.   Head:  Normocephalic and atraumatic. Eyes:  PERRLA, no icterus.exam deferred to patient's ophthalmologist.   Mouth:  No deformity or lesions, dentition normal. Neck:  Supple; no masses or thyromegaly. Lungs:  Clear throughout to auscultation. Heart:  Regular rate and rhythm; no murmurs, rubs,  or bruits. Abdomen:  Soft, nontender and nondistended. No masses, hepatosplenomegaly or hernias noted. Normal bowel sounds. Extremities:  No clubbing, cyanosis, edema or deformities noted. Neurologic:  Alert and  oriented x4;  grossly normal neurologically. Skin:  Intact without significant lesions or rashes. Cervical Nodes:  No significant cervical adenopathy. Psych:  Alert and cooperative. Normal mood and affect.   Impression & Recommendations:  Problem # 1:  CONSTIPATION (ICD-564.00) Assessment Unchanged Continue Current regime and will again ask her to take [facility] t.i.d. Also Will Try Dukes Magic mouthwash swish and swallow 5 cc q.i.d. I suspect some of her current symptomatology is related to previous Tamiflu prescription. We will repeat labs and vital and level today per her symptoms of taste and smell distortion. She has some mild symptoms of gastroparesis which carefully will improve. Certainly no evidence of chronic restart her problems on exam today.  Problem # 2:  ABDOMINAL PAIN, LEFT LOWER QUADRANT (ICD-789.04) Assessment: Improved  Problem # 3:  DEPRESSION (ICD-311) Assessment: Improved continue current medicines as reviewed.  Problem # 4:  GERD (ICD-530.81) Assessment: Improved Continue reflex exam  and daily protonix 40 mg Orders: TLB-CBC Platelet - w/Differential (85025-CBCD) TLB-BMP (Basic Metabolic Panel-BMET)  (80048-METABOL) TLB-Hepatic/Liver Function Pnl (80076-HEPATIC) TLB-TSH (Thyroid Stimulating Hormone) (84443-TSH) TLB-B12, Serum-Total ONLY (91478-G95) TLB-Ferritin (82728-FER) TLB-Folic Acid (Folate) (82746-FOL) TLB-IBC Pnl (Iron/FE;Transferrin) (83550-IBC) TLB-Magnesium (Mg) (83735-MG) TLB-Sedimentation Rate (ESR) (85652-ESR) TLB-IgA (Immunoglobulin A) (82784-IGA) T-Sprue Panel (Celiac Disease Aby Eval) (83516x3/[address]) T- * Misc. Laboratory test [phone])  Patient Instructions: 1)  Copy sent to : Dr. Teodora Medici 2)  Please continue current medications.  3)  Labs Pending 4)  The medication list was reviewed and reconciled.  All changed / newly prescribed medications were explained.  A complete medication list was provided to the patient / caregiver. Prescriptions: FIRST-BXN MOUTHWASH  SUSP (DIPHENHYD-LIDOCAINE-NYSTATIN) 5 cc swish and swallow qid  #1 pt x 1   Entered by:   Ashok Cordia RN   Authorized by:   Mardella Layman MD [facility]   Signed by:   Ashok Cordia RN on 10/06/2009   Method used:   Print then Give to Patient   RxID:   [id number]   Appended Document: 46-MONTH F/U APPT...LSW.    Clinical Lists Changes  Medications: Added new medication of [facility]  CAPS (PROBIOTIC PRODUCT) tid

## 2010-09-07 NOTE — Progress Notes (Signed)
Summary: ? re meds  Phone Note Call from Patient Call back at [phone] cell --[phone]   Caller: Patient Call For: Jarold Motto Reason for Call: Talk to Nurse Summary of Call: Patient has questions regarding her rx lexipro. Initial call taken by: Tawni Levy,  Dec 15, 2009 3:35 PM  Follow-up for Phone Call        Pt would like to try switching to Citalopram in place of lexapro.  This will save her alot on the cost.  If OK send 90 day supply to [facility].. (which mg 10, 20 or 40?)  Follow-up by: Ashok Cordia RN,  Dec 15, 2009 3:49 PM  Additional Follow-up for Phone Call Additional follow up Details #1::        ok Additional Follow-up by: Mardella Layman MD [facility],  Dec 15, 2009 4:06 PM    New/Updated Medications: CELEXA 10 MG TABS (CITALOPRAM HYDROBROMIDE) 1 by mouth qd Prescriptions: CELEXA 10 MG TABS (CITALOPRAM HYDROBROMIDE) 1 by mouth qd  #90 x 3   Entered by:   Ashok Cordia RN   Authorized by:   Mardella Layman MD [facility]   Signed by:   Ashok Cordia RN on 12/15/2009   Method used:   Faxed to ...       [facility])             , [place]         Ph: [phone]       Fax: [phone]   RxID:   [id number]

## 2010-09-07 NOTE — Progress Notes (Signed)
Summary: Problems with refills  Phone Note Call from Patient Call back at Work Phone [phone] Call back at [phone]   Call For: DR Derico Mitton Summary of Call: Having trouble with [facility] and would like to speak to [facility]. Initial call taken by: Leanor Kail [facility],  September 15, 2009 11:26 AM  Follow-up for Phone Call        Pt needs [facility] for Protonix two times a day. thier is also a question re Klonipin RX that was faxed last week. Lupita Leash Surface RN  September 15, 2009 12:09 PM  Rxs printed, signed and [facility]ed to [facility] per pt request. Follow-up by: Ashok Cordia RN,  September 15, 2009 12:16 PM    Prescriptions: PROTONIX 40 MG  TBEC (PANTOPRAZOLE SODIUM) 1 twice a day 30 minutes before meals  #180 x 3   Entered by:   Ashok Cordia RN   Authorized by:   Mardella Layman MD [facility]   Signed by:   Ashok Cordia RN on 09/15/2009   Method used:   Printed then faxed to ...       [facility] # [id number]* (retail)       [address]       [place], [place]  [address]       Ph: [phone]       Fax: [phone]   RxID:   [id number] KLONOPIN 0.5 MG TABS (CLONAZEPAM) 1 tablet by mouth bid  #180 x 3   Entered by:   Ashok Cordia RN   Authorized by:   Mardella Layman MD [facility]   Signed by:   Ashok Cordia RN on 09/15/2009   Method used:   Printed then faxed to ...       [facility] # [id number]* (retail)       [address]       [place], [place]  [address]       Ph: [phone]       Fax: [phone]   RxID:   [id number]

## 2010-09-09 NOTE — Progress Notes (Signed)
Summary: Schedule ultrasound  Phone Note Call from Patient Call back at [phone] Cell   Caller: Patient Call For: Dr. Jarold Motto Reason for Call: Talk to Nurse Summary of Call: Wants to sch'd an Ultrasound of her liver. Initial call taken by: Karna Christmas,  August 11, 2010 10:16 AM  Follow-up for Phone Call        Spoke with patient about reason for Abdominal U/S- patient with hx of Hepatic Cyst. Patient stated she usually has one in January-  last U/S 08/17/09. Dr Jarold Motto, ok to order Abdominal U/S to re evaluate hepatic cyst? Graciella Freer RN  August 11, 2010 12:05 PM  Scheduled Abdominal U/S for 08/13/10 @ [facility] @ 0900am. She was also scheduled for a f/u with Dr Jarold Motto for 09/14/10. Follow-up by: Graciella Freer RN,  August 11, 2010 4:17 PM  Additional Follow-up for Phone Call Additional follow up Details #1::        yes Additional Follow-up by: Mardella Layman MD [facility],  August 11, 2010 1:03 PM     Appended Document: Schedule ultrasound Patient notified and order faxed to [facility] for U/S. Graciella Freer, RN 08/11/10 @ [phone]

## 2010-09-14 ENCOUNTER — Encounter: Payer: Self-pay | Admitting: Gastroenterology

## 2010-09-14 ENCOUNTER — Encounter (INDEPENDENT_AMBULATORY_CARE_PROVIDER_SITE_OTHER): Payer: Self-pay | Admitting: *Deleted

## 2010-09-14 ENCOUNTER — Ambulatory Visit (INDEPENDENT_AMBULATORY_CARE_PROVIDER_SITE_OTHER): Payer: BC Managed Care – PPO | Admitting: Gastroenterology

## 2010-09-14 ENCOUNTER — Other Ambulatory Visit: Payer: BC Managed Care – PPO

## 2010-09-14 ENCOUNTER — Other Ambulatory Visit: Payer: Self-pay | Admitting: Gastroenterology

## 2010-09-14 DIAGNOSIS — K900 Celiac disease: Secondary | ICD-10-CM

## 2010-09-14 DIAGNOSIS — D509 Iron deficiency anemia, unspecified: Secondary | ICD-10-CM

## 2010-09-14 DIAGNOSIS — R1032 Left lower quadrant pain: Secondary | ICD-10-CM

## 2010-09-14 LAB — IBC PANEL: Iron: 17 ug/dL — ABNORMAL LOW (ref 42–145)

## 2010-09-14 LAB — BASIC METABOLIC PANEL
BUN: 11 mg/dL (ref 6–23)
Creatinine, Ser: 0.7 mg/dL (ref 0.4–1.2)
GFR: 91.81 mL/min (ref >60.00)

## 2010-09-14 LAB — CBC WITH DIFFERENTIAL/PLATELET
Basophils Relative: 0.9 % (ref 0.0–3.0)
Eosinophils Absolute: 0.1 10*3/uL (ref 0.0–0.7)
Hemoglobin: 10.2 g/dL — ABNORMAL LOW (ref 12.0–15.0)
MCHC: 32.4 g/dL (ref 30.0–36.0)
MCV: 75.6 fl — ABNORMAL LOW (ref 78.0–100.0)
Monocytes Absolute: 0.6 10*3/uL (ref 0.1–1.0)
Neutro Abs: 4.1 10*3/uL (ref 1.4–7.7)
Neutrophils Relative %: 56.8 % (ref 43.0–77.0)
RBC: 4.17 Mil/uL (ref 3.87–5.11)
RDW: 18.4 % — ABNORMAL HIGH (ref 11.5–14.6)

## 2010-09-14 LAB — FOLATE: Folate: 8.7 ng/mL (ref >5.9)

## 2010-09-14 LAB — HEPATIC FUNCTION PANEL: Total Bilirubin: 0.4 mg/dL (ref 0.3–1.2)

## 2010-09-14 LAB — VITAMIN B12: Vitamin B-12: >1500 pg/mL — ABNORMAL HIGH (ref 211–911)

## 2010-09-14 LAB — TSH: TSH: 1.1 u[IU]/mL (ref 0.35–5.50)

## 2010-09-14 LAB — FERRITIN: Ferritin: 2.7 ng/mL — ABNORMAL LOW (ref 10.0–291.0)

## 2010-09-17 ENCOUNTER — Telehealth: Payer: Self-pay | Admitting: Gastroenterology

## 2010-09-23 NOTE — Letter (Signed)
Summary: [facility] Instructions  [facility]  [address] [place], [place] [address]   Phone: [phone]  Fax: [phone]       [name]    [birth year]    MRN: [id number]        Procedure Day /Date: Friday 10/08/2010     Arrival Time: 8:30am     Procedure Time: 9:30am     Location of Procedure:                    X  [facility] (4th Floor)  PREPARATION FOR COLONOSCOPY WITH MOVIPREP   Starting 5 days prior to your procedure 10/03/2010 do not eat nuts, seeds, popcorn, corn, beans, peas,  salads, or any raw vegetables.  Do not take any fiber supplements (e.g. Metamucil, Citrucel, and Benefiber).  THE DAY BEFORE YOUR PROCEDURE         Thursday 10/07/2010  1.  Drink clear liquids the entire day-NO SOLID FOOD  2.  Do not drink anything colored red or purple.  Avoid juices with pulp.  No orange juice.  3.  Drink at least 64 oz. (8 glasses) of fluid/clear liquids during the day to prevent dehydration and help the prep work efficiently.  CLEAR LIQUIDS INCLUDE: Water Jello Ice Popsicles Tea (sugar ok, no milk/cream) Powdered fruit flavored drinks Coffee (sugar ok, no milk/cream) Gatorade Juice: apple, white grape, white cranberry  Lemonade Clear bullion, consomm, broth Carbonated beverages (any kind) Strained chicken noodle soup Hard Candy                             4.  In the morning, mix first dose of MoviPrep solution:    Empty 1 Pouch A and 1 Pouch B into the disposable container    Add lukewarm drinking water to the top line of the container. Mix to dissolve    Refrigerate (mixed solution should be used within 24 hrs)  5.  Begin drinking the prep at 5:00 p.m. The MoviPrep container is divided by 4 marks.   Every 15 minutes drink the solution down to the next mark (approximately 8 oz) until the full liter is complete.   6.  Follow completed prep with 16 oz of clear liquid of your choice (Nothing red or purple).  Continue to drink  clear liquids until bedtime.  7.  Before going to bed, mix second dose of MoviPrep solution:    Empty 1 Pouch A and 1 Pouch B into the disposable container    Add lukewarm drinking water to the top line of the container. Mix to dissolve    Refrigerate  THE DAY OF YOUR PROCEDURE      Friday 10/08/2010  Beginning at 4:30am (5 hours before procedure):         1. Every 15 minutes, drink the solution down to the next mark (approx 8 oz) until the full liter is complete.  2. Follow completed prep with 16 oz. of clear liquid of your choice.    3. You may drink clear liquids until 7:30am (2 HOURS BEFORE PROCEDURE).   MEDICATION INSTRUCTIONS  Unless otherwise instructed, you should take regular prescription medications with a small sip of water   as early as possible the morning of your procedure.            OTHER INSTRUCTIONS  You will need a responsible adult at least 57 years of age to accompany you and  drive you home.   This person must remain in the waiting room during your procedure.  Wear loose fitting clothing that is easily removed.  Leave jewelry and other valuables at home.  However, you may wish to bring a book to read or  an iPod/MP3 player to listen to music as you wait for your procedure to start.  Remove all body piercing jewelry and leave at home.  Total time from sign-in until discharge is approximately 2-3 hours.  You should go home directly after your procedure and rest.  You can resume normal activities the  day after your procedure.  The day of your procedure you should not:   Drive   Make legal decisions   Operate machinery   Drink alcohol   Return to work  You will receive specific instructions about eating, activities and medications before you leave.    The above instructions have been reviewed and explained to me by   _______________________    I fully understand and can verbalize these instructions _____________________________ Date  _________

## 2010-09-23 NOTE — Assessment & Plan Note (Signed)
Summary: Problems with Glutten   History of Present Illness Visit Type: Follow-up Visit Primary GI MD: Sheryn Bison MD FACP FAGA Primary Provider: Teodora Medici, MD Requesting Provider: n/a Chief Complaint: Problems with Glutten History of Present Illness:   55 year old Caucasian female with celiac disease who does well as long as she sticks to her diet. Over the Christmas holidays she went off of her diet and had worsening constipation rather severe lower abdominal discomfort. She was seen by Dr.Mezer in gynecology, she apparently had a normal GYN exam. Because of her constipation she has been on Amitiza 8 micrograms twice a day, probiotics, and Dulcolax. Her chronic acid reflux is controlled with Protonix 40 mg a day.  Recently she's had new onset iron deficiency anemia and is on tandem iron replacement. She denies dysphagia,upper abdominal pain, hepatobiliary complaints, melena or hematochezia. Recent ultrasound followup of hepatic cyst was fairly unremarkable. She has had previous hysterectomy and laparoscopic lysis of adhesions. She also has had bilateral ovarian removal. She is a former smoker, uses alcohol socially, and is on meloxicam 15 mg a day. For depression she takes Celexa 40 mg a day,and Klonopin 0.5 mg twice a day 4 anxiety syndrome. She also suffers from interstitial cystitis.   GI Review of Systems    Reports abdominal pain.      Denies acid reflux, belching, bloating, chest pain, dysphagia with liquids, dysphagia with solids, heartburn, loss of appetite, nausea, vomiting, vomiting blood, weight loss, and  weight gain.        Denies anal fissure, black tarry stools, change in bowel habit, constipation, diarrhea, diverticulosis, fecal incontinence, heme positive stool, hemorrhoids, irritable bowel syndrome, jaundice, light color stool, liver problems, rectal bleeding, and  rectal pain.    Current Medications (verified): 1)  Protonix 40 Mg  Tbec (Pantoprazole Sodium) .Marland Kitchen.. 1  Twice A Day 30 Minutes Before Meals 2)  Celexa 40 Mg Tabs (Citalopram Hydrobromide) .Marland Kitchen.. 1 By Mouth Once Daily 3)  Klonopin 0.5 Mg Tabs (Clonazepam) .Marland Kitchen.. 1 Tablet By Mouth Bid 4)  Dulcolax 5 Mg Tbec (Bisacodyl) .... As Needed 5)  Amitiza 8 Mcg  Caps (Lubiprostone) .Marland Kitchen.. 1 Two Times A Day/take With Food and Water 6)  Meloxicam 15 Mg Tabs (Meloxicam) .... Take 1 Tablet By Mouth As Needed 7)  Tandem 162-115.2 Mg Caps (Ferrous Fum-Iron Polysacch) .Marland Kitchen.. 1 By Mouth Qd 8)  First-Bxn Mouthwash  Susp (Diphenhyd-Lidocaine-Nystatin) .... 5 Cc Swish and Swallow Qid 9)  Phillips Colon Health  Caps (Probiotic Product) .... Tid 10)  Ultra Complex Probiotic 50 .... One Tablet By Mouth Once Daily 11)  Caltrate 600 1500 Mg Tabs (Calcium Carbonate) .... Two Times A Day  Allergies (verified): 1)  ! Jonne Ply  Past History:  Family History: Last updated: 09/08/2009 Family History of Heart Disease: Mother Family History of Bladder Cancer: Mother Family History of Pancreatic Cancer: Paternal Grandfather Family History of Diabetes: Paternal Grandmother No FH of Colon Cancer:  Social History: Last updated: 09/08/2009 Patient is a former smoker. -stopped at age 35. Illicit Drug Use - no Occupation: Solicitor of Social Services Alcohol Use - yes-1 weekly Daily Caffeine Use Patient gets regular exercise.  Past medical, surgical, family and social histories (including risk factors) reviewed for relevance to current acute and chronic problems.  Past Medical History: Reviewed history from 08/18/2008 and no changes required. Current Problems:  ABDOMINAL PAIN, LEFT LOWER QUADRANT (ICD-789.04) HEPATIC CYST (ICD-573.8) INTERSTITIAL CYSTITIS (ICD-595.1) DEPRESSION (ICD-311) GERD (ICD-530.81) CONSTIPATION (ICD-564.00) IBS (ICD-564.1)  Past Surgical History: Reviewed  history from 09/08/2009 and no changes required. milk gland removed  Hysterectomy and laparoscopic lysis of pelvic adhesions. Oophorectomy  Family  History: Reviewed history from 09/08/2009 and no changes required. Family History of Heart Disease: Mother Family History of Bladder Cancer: Mother Family History of Pancreatic Cancer: Paternal Grandfather Family History of Diabetes: Paternal Grandmother No FH of Colon Cancer:  Social History: Reviewed history from 09/08/2009 and no changes required. Patient is a former smoker. -stopped at age 69. Illicit Drug Use - no Occupation: Solicitor of Social Services Alcohol Use - yes-1 weekly Daily Caffeine Use Patient gets regular exercise.  Review of Systems  The patient denies allergy/sinus, anemia, anxiety-new, arthritis/joint pain, back pain, blood in urine, breast changes/lumps, change in vision, confusion, cough, coughing up blood, depression-new, fainting, fatigue, fever, headaches-new, hearing problems, heart murmur, heart rhythm changes, itching, menstrual pain, muscle pains/cramps, night sweats, nosebleeds, pregnancy symptoms, shortness of breath, skin rash, sleeping problems, sore throat, swelling of feet/legs, swollen lymph glands, thirst - excessive , urination - excessive , urination changes/pain, urine leakage, vision changes, and voice change.    Vital Signs:  Patient profile:   55 year old female Height:      66 inches Weight:      149 pounds BMI:     24.14 BSA:     1.77 Pulse rate:   68 / minute Pulse rhythm:   regular BP sitting:   132 / 68  (left arm)  Vitals Entered By: Merri Ray CMA Duncan Dull) (September 14, 2010 2:21 PM)  Physical Exam  General:  Well developed, well nourished, no acute distress.healthy appearing.   Head:  Normocephalic and atraumatic. Eyes:  PERRLA, no icterus.exam deferred to patient's ophthalmologist.   Neck:  Supple; no masses or thyromegaly. Lungs:  Clear throughout to auscultation. Heart:  Regular rate and rhythm; no murmurs, rubs,  or bruits. Abdomen:  Soft, nontender and nondistended. No masses, hepatosplenomegaly or hernias noted.  Normal bowel sounds.Some tenderness to deep palpation the left lower quadrant area noted but no rebound tenderness Msk:  Symmetrical with no gross deformities. Normal posture. Pulses:  Normal pulses noted. Extremities:  No clubbing, cyanosis, edema or deformities noted. Neurologic:  Alert and  oriented x4;  grossly normal neurologically. Cervical Nodes:  No significant cervical adenopathy. Psych:  Alert and cooperative. Normal mood and affect.   Impression & Recommendations:  Problem # 1:  ANEMIA, IRON DEFICIENCY (ICD-280.9) Assessment Deteriorated Etiology probably a combination of iron malabsorption and perhaps NSAID gastroduodenitis.She also is due for followup colonoscopy exam.These exams have been scheduled at her convenience. Repeat labs also ordered today. I have asked her to restart her gluten-free diet pending her workup and clinical course. Orders: TLB-CBC Platelet - w/Differential (85025-CBCD) TLB-BMP (Basic Metabolic Panel-BMET) (80048-METABOL) TLB-Hepatic/Liver Function Pnl (80076-HEPATIC) TLB-TSH (Thyroid Stimulating Hormone) (84443-TSH) TLB-B12, Serum-Total ONLY (56213-Y86) TLB-Ferritin (82728-FER) TLB-Folic Acid (Folate) (82746-FOL) TLB-IBC Pnl (Iron/FE;Transferrin) (83550-IBC) T-Beta Carotene (57846-96295) TLB-Magnesium (Mg) (83735-MG) T-Sprue Panel (Celiac Disease Aby Eval) (83516x3/[address]) TLB-IgA (Immunoglobulin A) (82784-IGA) Colon/Endo (Colon/Endo)  Problem # 2:  CELIAC SPRUE (ICD-579.0) Assessment: Deteriorated Repeat serologies and small bowel biopsy indicated. I asked her to continue her iron and calcium supplementation and also a daily multivitamin. Orders: TLB-CBC Platelet - w/Differential (85025-CBCD) TLB-BMP (Basic Metabolic Panel-BMET) (80048-METABOL) TLB-Hepatic/Liver Function Pnl (80076-HEPATIC) TLB-TSH (Thyroid Stimulating Hormone) (84443-TSH) TLB-B12, Serum-Total ONLY (28413-K44) TLB-Ferritin (82728-FER) TLB-Folic Acid (Folate)  (82746-FOL) TLB-IBC Pnl (Iron/FE;Transferrin) (83550-IBC) T-Beta Carotene [phone]) TLB-Magnesium (Mg) (83735-MG) T-Sprue Panel (Celiac Disease Aby Eval) (83516x3/[address]) TLB-IgA (Immunoglobulin A) (82784-IGA) Colon/Endo (Colon/Endo)  Problem # 3:  OTHER CONSTIPATION (ICD-564.09) Assessment: Deteriorated Colonoscopy scheduled and I will increase her Amitiza to 24 micrograms twice a day with meals as tolerated. 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  Problem # 4:  ABDOMINAL PAIN, LEFT LOWER QUADRANT (ICD-789.04) Assessment: Improved Her pain sounds like she had an inflammatory process, perhaps subacute diverticulitis which has resolved. Her evaluation is difficult because of her multiple problems, gynecologic issues, and history of interstitial cystitis. She is to use p.r.n. sublingual Levsin 0.125 mg every 6-8 hours as needed for intestinal spasms. Orders: TLB-CBC Platelet - w/Differential (85025-CBCD) TLB-BMP (Basic Metabolic Panel-BMET) (80048-METABOL) TLB-Hepatic/Liver Function Pnl (80076-HEPATIC) TLB-TSH (Thyroid Stimulating Hormone) (84443-TSH) TLB-B12, Serum-Total ONLY (52841-L24) TLB-Ferritin (82728-FER) TLB-Folic Acid (Folate) (82746-FOL) TLB-IBC Pnl (Iron/FE;Transferrin) (83550-IBC) T-Beta Carotene [phone]) TLB-Magnesium (Mg) (83735-MG) T-Sprue Panel (Celiac Disease Aby Eval) (83516x3/[address]) TLB-IgA (Immunoglobulin A) (82784-IGA) Colon/Endo (Colon/Endo)  Problem # 5:  INTERSTITIAL CYSTITIS (ICD-595.1) Assessment: Improved Continue urologic followup as scheduled.  Problem # 6:  HEPATIC CYST (ICD-573.8) Assessment: Improved This right hepatic cyst seems to have resolved on ultrasound exam, routine followup not suggested.  Patient Instructions: 1)  Copy sent to : Teodora Medici, MD 2)  Your procedure has been scheduled for 10/08/2010, please follow the seperate instructions.  3)  [facility] Patient Information Guide  given to patient.  4)  Colonoscopy and Flexible Sigmoidoscopy brochure given.  5)  Upper Endoscopy brochure given.  6)  Try Amitiza samples and call back if they work well so  we can send in an rx.  7)  Please go to the basement today for your labs.  8)  The medication list was reviewed and reconciled.  All changed / newly prescribed medications were explained.  A complete medication list was provided to the patient / caregiver. Prescriptions: KLONOPIN 0.5 MG TABS (CLONAZEPAM) 1 tablet by mouth two times a day  #180 x 3   Entered by:   Harlow Mares CMA (AAMA)   Authorized by:   Mardella Layman MD [facility]   Signed by:   Harlow Mares CMA (AAMA) on 09/14/2010   Method used:   Printed then faxed to ...       [facility] (mail-order)             , [place]         Ph: [phone]       Fax: [phone]   RxID:   [id number] MOVIPREP 100 GM  SOLR (PEG-KCL-NACL-NASULF-NA ASC-C) As per prep instructions.  #1 x 0   Entered by:   Harlow Mares CMA (AAMA)   Authorized by:   Mardella Layman MD [facility]   Signed by:   Harlow Mares CMA (AAMA) on 09/14/2010   Method used:   Electronically to        [facility] # [id number]* (retail)       [address]       [place], [place]  [address]       Ph: [phone]       Fax: [phone]   RxID:   [id number]   Appended Document: Problems with Glutten    Clinical Lists Changes  Medications: Added new medication of HYOMAX 0.125 MG TABS (HYOSCYAMINE SULFATE) take one by mouth two times a day - Signed Rx of HYOMAX 0.125 MG TABS (HYOSCYAMINE SULFATE) take one by mouth two times a day;  #60 x 3;  Signed;  Entered by: Harlow Mares CMA (AAMA);  Authorized by: Mardella Layman MD  FACG;  Method used: Electronically to [facility] # [id number]*, [address], [place], [place]  [address], Ph: [phone], Fax: [phone]    Prescriptions: HYOMAX 0.125 MG TABS (HYOSCYAMINE SULFATE) take one by mouth two times a day  #60 x 3   Entered by:   Harlow Mares CMA (AAMA)   Authorized by:   Mardella Layman MD [facility]   Signed by:   Harlow Mares CMA (AAMA) on 09/14/2010   Method used:   Electronically to        [id number]* (retail)       [address]       [place], [place]  [address]       Ph: [phone]       Fax: [phone]   RxID:   [id number]

## 2010-09-23 NOTE — Progress Notes (Signed)
Summary: Needs Amitiza called in  Phone Note Call from Patient Call back at Home Phone [phone] Call back at [phone]   Call For: Dr Jarold Motto Reason for Call: Talk to Nurse Summary of Call: Wants Amitiza 24 micrograms Wants it called [facility] [place] [facility] located on  [address] Initial call taken by: Leanor Kail [facility],  September 17, 2010 10:38 AM  Follow-up for Phone Call        rx sent, pt aware Follow-up by: Harlow Mares CMA Duncan Dull),  September 17, 2010 10:42 AM    Prescriptions: AMITIZA 24 MCG CAPS (LUBIPROSTONE) take one by mouth two times a day  #60 x 3   Entered by:   Harlow Mares CMA (AAMA)   Authorized by:   Mardella Layman MD [facility]   Signed by:   Harlow Mares CMA (AAMA) on 09/17/2010   Method used:   Electronically to        [facility] # [phone]* (retail)       [address]       [place], [place]  [address]       Ph: [phone]       Fax: [phone]   RxID:   [id number]

## 2010-10-01 ENCOUNTER — Telehealth: Payer: Self-pay | Admitting: Gastroenterology

## 2010-10-05 NOTE — Progress Notes (Signed)
Summary: Med refill  Phone Note Call from Patient Call back at Home Phone [phone]   Caller: Patient Call For: Dr. Jarold Motto Reason for Call: Refill Medication Summary of Call: Needs a refill on her Protonix....[facility] Initial call taken by: Karna Christmas,  October 01, 2010 9:29 AM  Follow-up for Phone Call        rx sent  Follow-up by: Harlow Mares CMA Duncan Dull),  October 01, 2010 9:50 AM    Prescriptions: PROTONIX 40 MG  TBEC (PANTOPRAZOLE SODIUM) 1 twice a day 30 minutes before meals  #180 x 3   Entered by:   Harlow Mares CMA (AAMA)   Authorized by:   Mardella Layman MD [facility]   Signed by:   Harlow Mares CMA (AAMA) on 10/01/2010   Method used:   Faxed to ...       [facility])             , [place]         Ph: [phone]       Fax: [phone]   RxID:   [id number]

## 2010-10-08 ENCOUNTER — Encounter: Payer: Self-pay | Admitting: Gastroenterology

## 2010-10-08 ENCOUNTER — Other Ambulatory Visit: Payer: Self-pay | Admitting: Gastroenterology

## 2010-10-08 ENCOUNTER — Encounter (AMBULATORY_SURGERY_CENTER): Payer: BC Managed Care – PPO | Admitting: Gastroenterology

## 2010-10-08 DIAGNOSIS — D649 Anemia, unspecified: Secondary | ICD-10-CM

## 2010-10-08 DIAGNOSIS — K648 Other hemorrhoids: Secondary | ICD-10-CM

## 2010-10-08 DIAGNOSIS — D131 Benign neoplasm of stomach: Secondary | ICD-10-CM

## 2010-10-08 DIAGNOSIS — K5900 Constipation, unspecified: Secondary | ICD-10-CM

## 2010-10-08 DIAGNOSIS — K219 Gastro-esophageal reflux disease without esophagitis: Secondary | ICD-10-CM

## 2010-10-08 DIAGNOSIS — K2990 Gastroduodenitis, unspecified, without bleeding: Secondary | ICD-10-CM

## 2010-10-08 DIAGNOSIS — D509 Iron deficiency anemia, unspecified: Secondary | ICD-10-CM

## 2010-10-08 DIAGNOSIS — K2970 Gastritis, unspecified, without bleeding: Secondary | ICD-10-CM

## 2010-10-08 HISTORY — PX: COLONOSCOPY: SHX174

## 2010-10-08 HISTORY — PX: ESOPHAGOGASTRODUODENOSCOPY: SHX1529

## 2010-10-11 LAB — HELICOBACTER PYLORI SCREEN-BIOPSY: UREASE: NEGATIVE

## 2010-10-12 ENCOUNTER — Encounter: Payer: Self-pay | Admitting: Gastroenterology

## 2010-10-14 NOTE — Procedures (Addendum)
Summary: Colonoscopy  Patient: [name] Note: All result statuses are Final unless otherwise noted.  Tests: (1) Colonoscopy (COL)   COL Colonoscopy           DONE     [facility]     [address]     [place], [place]  [address]          COLONOSCOPY PROCEDURE REPORT          PATIENT:  Alexandra, Stephens  MR#:  [id number]     BIRTHDATE:  06/26/1956, 54 yrs. old  GENDER:  female     ENDOSCOPIST:  Vania Rea. Jarold Motto, MD, [facility]     REF. BY:  Teodora Medici, M.D.     PROCEDURE DATE:  10/08/2010     PROCEDURE:  Average-risk screening colonoscopy     G0121     ASA CLASS:  Class II     INDICATIONS:  Routine Risk Screening, constipation     MEDICATIONS:   propofol (Diprivan) 240 mg IV          DESCRIPTION OF PROCEDURE:   After the risks benefits and     alternatives of the procedure were thoroughly explained, informed     consent was obtained.  Digital rectal exam was performed and     revealed no abnormalities.   The [id number] endoscope was     introduced through the anus and advanced to the cecum, which was     identified by both the appendix and ileocecal valve, limited by a     redundant colon, poor preparation.    The quality of the prep was     adequate, using MoviPrep.  The instrument was then slowly     withdrawn as the colon was fully examined.     <<PROCEDUREIMAGES>>          FINDINGS:  No polyps or cancers were seen.  This was otherwise a     normal examination of the colon.   Retroflexed views in the rectum     revealed hemorrhoids.    The scope was then withdrawn from the     patient and the procedure completed.          COMPLICATIONS:  None     ENDOSCOPIC IMPRESSION:     1) No polyps or cancers     2) Otherwise normal examination     3) Hemorrhoids     RECOMMENDATIONS:     1) Continue current colorectal screening recommendations for     "routine risk" patients with a repeat colonoscopy in 10 years.     REPEAT EXAM:  No       ______________________________     Vania Rea. Jarold Motto, MD, [facility]          CC:          n.     eSIGNED:   Vania Rea. Patterson at 10/08/2010 09:46 AM          Gilmore Laroche, [id number]  Note: An exclamation mark (!) indicates a result that was not dispersed into the flowsheet. Document Creation Date: 10/08/2010 9:46 AM _______________________________________________________________________  (1) Order result status: Final Collection or observation date-time: 10/08/2010 09:41 Requested date-time:  Receipt date-time:  Reported date-time:  Referring Physician:   Ordering Physician: Sheryn Bison [phone]) Specimen Source:  Source: Launa Grill Order Number: [id number] Lab site:   Appended Document: Colonoscopy    Clinical Lists Changes  Observations: Added new observation of COLONNXTDUE: 10/2020 (10/08/2010 12:33)

## 2010-10-14 NOTE — Miscellaneous (Signed)
Summary: Orders Update-clotest  Clinical Lists Changes  Orders: Added new Test order of TLB-H Pylori Screen Gastric Biopsy (83013-CLOTEST) - Signed Observations: Added new observation of ALLERGY REV: Done (10/08/2010 10:19)

## 2010-10-14 NOTE — Procedures (Addendum)
Summary: Upper Endoscopy  Patient: [name] Note: All result statuses are Final unless otherwise noted.  Tests: (1) Upper Endoscopy (EGD)   EGD Upper Endoscopy       DONE     [facility]     [address]     [place], [place]  [address]          ENDOSCOPY PROCEDURE REPORT          PATIENT:  [name], [name]  MR#:  [id number]     BIRTHDATE:  Feb 02, 1956, 54 yrs. old  GENDER:  female          ENDOSCOPIST:  Vania Rea. Jarold Motto, MD, [facility]     Referred by:  Crawford Givens, M.D.          PROCEDURE DATE:  10/08/2010     PROCEDURE:  EGD with biopsy for H. pylori 08657     ASA CLASS:  Class II     INDICATIONS:  SUSPECTED CELIAC DISEASE,IRON DEF,GAS AND BLOATING     AND ABD PAIN.          MEDICATIONS:   There was residual sedation effect present from     prior procedure., propofol (Diprivan) 160 mg IV     TOPICAL ANESTHETIC:          DESCRIPTION OF PROCEDURE:   After the risks benefits and     alternatives of the procedure were thoroughly explained, informed     consent was obtained.  The LB GIF-H180 [id number] endoscope was     introduced through the mouth and advanced to the second portion of     the duodenum, without limitations.  The instrument was slowly     withdrawn as the mucosa was fully examined.     <<PROCEDUREIMAGES>>          A sessile polyp was found in the body of the stomach. COLD BX.     REMOVED.  Normal duodenal folds were noted. SI BX. DONE.  Moderate     gastritis was found in the body and the antrum of the stomach.     NODULAR ANTRAL GASTRITIS,NO EROSIONS,CLO BX. DONE.  The duodenal     bulb was normal in appearance, as was the postbulbar duodenum.     Retroflexed views revealed no abnormalities.    The scope was then     withdrawn from the patient and the procedure completed.          COMPLICATIONS:  None          ENDOSCOPIC IMPRESSION:     1) Sessile polyp in the body of the stomach     2) Normal duodenal folds     3) Moderate gastritis in  the body and the antrum of the stomach          4) Normal duodenum     RECOMMENDATIONS:     1) Await pathology results     2) Rx CLO if positive     3) continue current medications          REPEAT EXAM:  No          ______________________________     Vania Rea. Jarold Motto, MD, [facility]          CC:          n.     eSIGNED:   Vania Rea. Krystel Fletchall at 10/08/2010 10:03 AM          Gilmore Laroche, [id number]  Note: An exclamation mark Marland Kitchen)  indicates a result that was not dispersed into the flowsheet. Document Creation Date: 10/08/2010 10:03 AM _______________________________________________________________________  (1) Order result status: Final Collection or observation date-time: 10/08/2010 09:56 Requested date-time:  Receipt date-time:  Reported date-time:  Referring Physician:   Ordering Physician: Sheryn Bison [phone]) Specimen Source:  Source: Launa Grill Order Number: [id number] Lab site:

## 2010-10-19 NOTE — Letter (Signed)
Summary: Patient [facility] Biopsy Results  [facility]  [address] [place], [place] [address]   Phone: [phone]  Fax: [phone]        October 12, 2010 MRN: [id number]    [name] [address] [place], [place]  [address]    Dear Ms. Markey,  I am pleased to inform you that the biopsies taken during your recent endoscopic examination did not show any evidence of cancer upon pathologic examination.  Additional information/recommendations:  __No further action is needed at this time.  Please follow-up with      your primary care physician for your other healthcare needs.  __ Please call [phone] to schedule a return visit to review      your condition.  _x_ Continue with the treatment plan as outlined on the day of your      exam.Biopsies Did not show active celiac disease but I would continue with your dietary adjustments as we discussed. This biopsy probably reflects good dietary compliance with gluten elimination.  __ You should have a repeat endoscopic examination for this problem              in _ months/years.   Please call us if you are having persistent problems or have questions about your condition that have not been fully answered at this time.  Sincerely,  Mardella Layman MD [facility]  This letter has been electronically signed by your physician.  Appended Document: Patient Notice-Endo Biopsy Results letter mailed

## 2010-11-18 ENCOUNTER — Other Ambulatory Visit: Payer: Self-pay | Admitting: Sports Medicine

## 2010-11-18 DIAGNOSIS — M4800 Spinal stenosis, site unspecified: Secondary | ICD-10-CM

## 2010-11-18 LAB — COMPREHENSIVE METABOLIC PANEL
Albumin: 4.1 g/dL (ref 3.5–5.2)
Alkaline Phosphatase: 63 U/L (ref 39–117)
BUN: 7 mg/dL (ref 6–23)
CO2: 28 mEq/L (ref 19–32)
Chloride: 102 mEq/L (ref 96–112)
GFR calc non Af Amer: >60 mL/min (ref >60)
Glucose, Bld: 89 mg/dL (ref 70–99)
Potassium: 3.5 mEq/L (ref 3.5–5.1)
Total Bilirubin: 0.6 mg/dL (ref 0.3–1.2)

## 2010-11-18 LAB — CBC
HCT: 37.4 % (ref 36.0–46.0)
Hemoglobin: 12.5 g/dL (ref 12.0–15.0)
MCV: 87.2 fL (ref 78.0–100.0)
Platelets: 254 10*3/uL (ref 150–400)
RBC: 4.29 MIL/uL (ref 3.87–5.11)
WBC: 7 10*3/uL (ref 4.0–10.5)

## 2010-11-21 ENCOUNTER — Ambulatory Visit
Admission: RE | Admit: 2010-11-21 | Discharge: 2010-11-21 | Disposition: A | Discharge status: Home / Self Care | Payer: BC Managed Care – PPO | Source: Ambulatory Visit | Attending: Sports Medicine | Admitting: Sports Medicine

## 2010-11-21 DIAGNOSIS — M4800 Spinal stenosis, site unspecified: Secondary | ICD-10-CM

## 2010-12-21 NOTE — H&P (Signed)
[name], [name]                ACCOUNT NO.:  [id number]   MEDICAL RECORD NO.:  [id number]          PATIENT TYPE:  AMB   LOCATION:                                FACILITY:  [facility]   PHYSICIAN:  Guy Sandifer. Henderson Cloud, M.D. DATE OF BIRTH:  June 21, 1956   DATE OF ADMISSION:  10/29/2008  DATE OF DISCHARGE:                              HISTORY & PHYSICAL   CHIEF COMPLAINT:  Pelvic pain.   HISTORY OF PRESENT ILLNESS:  The patient is a 55 year old married white  female G3, P3 status post total abdominal hysterectomy who has had a  history of pelvic adhesions.  She complains of left lower quadrant pain  and some right lower quadrant pain that was waxed and waned over the  years.  It is becoming progressively worse.  She has had consultation  with gastroenterologist who does not believe it is GI in origin.  A  recent ultrasound in Dr. Renard Matter office in the last 1-2 months revealed  basically normal appearing ovaries.  FSH was consistent with menopause.  After careful discussion of options, she is being admitted for  laparoscopic bilateral salpingo-oophorectomy.  Possibility of continued  recurrent pain has been reviewed as well as risks of surgery.  She  understands there is also a possibility of laparotomy.   PAST MEDICAL HISTORY:  1. Interstitial cystitis.  2. Anxiety and depression.  3. Radicular leg pain.  4. Chronic constipation.  5. Bursitis in her left hip.  6. Headache.  7. UTI.   PAST SURGICAL HISTORY:  1. Left breast biopsy benign.  2. Polypectomy of vocal cords.  3. Laparoscopy, hysteroscopy, D&C x2.  4. Abdominal hysterectomy.   OBSTETRICAL HISTORY:  Vaginal delivery x3.   FAMILY HISTORY:  Positive for heart disease, respiratory disease,  diabetes, UTI, and bladder cancer in her mother.   MEDICATIONS:  1. Protonix 40 mg daily.  2. Lexapro 20 mg daily.  3. Amitiza 8 mcg daily.  4. Klonopin p.r.n.   ALLERGIES:  She states she is sensitive to ASPIRIN which can cause  nausea.  She cannot take ALEVE and IBUPROFEN both without trouble as  long as there is food on her stomach.   SOCIAL HISTORY:  Has one drink of alcohol per day.  Denies drug or  tobacco abuse.   REVIEW OF SYSTEMS:  NEURO:  Headache as above.  CARDIAC:  Denies chest  pain.  PULMONARY:  Denies shortness of breath.  GI:  Denies recent  changes in bowel habits.  She does have a history of constipation.   PHYSICAL EXAMINATION:  VITAL SIGNS:  Height 5 feet 6-1/2 inches, weight  164.6 pounds, blood pressure 130/80.  HEENT: Without thyromegaly.  LUNGS:  Clear to auscultation.  HEART:  Regular rate and rhythm.  BACK:  Without CVA tenderness.  ABDOMEN:  Soft with mild left lower quadrant pain without rebound or  masses.  PELVIC:  Vulva and vagina without lesion.  Adnexa nontender without  palpable masses.  EXTREMITIES:  Grossly within normal limits.  NEUROLOGICAL:  Grossly within normal limits.   ASSESSMENT:  Pelvic pain.   PLAN:  Laparoscopic bilateral salpingo-oophorectomy.      Guy Sandifer Henderson Cloud, M.D.  Electronically Signed     JET/MEDQ  D:  10/27/2008  T:  10/28/2008  Job:  [id number]

## 2010-12-21 NOTE — Assessment & Plan Note (Signed)
[name]                         GASTROENTEROLOGY OFFICE NOTE   NAME:[name], [name]                       MRN:          [id number]  DATE:04/27/2007                            DOB:          [birth year]    [name] has had a recurrent episode of rather severe substernal pain a  couple of weeks ago, precipitated by a large amount of stress in her  life.  She is undergoing personal stress with her children, and is  currently on Lexapro 20 mg a day and clonazepam 0.5 mg p.r.n. per  [facility] and Dr. Tod Persia.  She denies any  hallucinations, delusions, or psychotic-type symptoms or feelings of  suicide.   [name] has a long history of gastroesophageal reflux disease, also has a  benign hepatic cyst that we follow yearly.  She has been taking Protonix  40 mg a day, and has recently increased this to twice a day with fairly  good response.  She denies dysphagia or odynophagia or any  cardiovascular or pulmonary complaints.  She does exercise regularly.  She is having regular bowel movements without melena or hematochezia.  She has had no recent change in her diet, antibiotics, or other  environmental changes.  She readily admits that a lot of her problems  are caused by stress.  She is starting to feel that her depression is  slightly eased by taking SSRI medications.   She is a healthy, attractive-appearing white female in no distress.  She weighs 153 pounds and blood pressure 118/72.  Pulse was 80 and  regular.  Noted no stigmata of chronic liver disease.  Her chest was entirely clear.  She was in a regular rhythm without murmurs, gallops, or rubs.  I could not appreciate organomegaly, abdominal masses or significant  tenderness.  Bowel sounds were normal.  Peripheral extremities were unremarkable.  Mental status was normal.   ASSESSMENT:  1. Probable reflux esophagitis flare with an element of associated      anxiety  syndrome.  2. Treated depression with also a history of claustrophobia.  3. History of interstitial cystitis, treated by Dr. Retta Diones.  4. Benign hepatic cyst with yearly ultrasound exams.  Previous liver      function tests and alpha fetoprotein levels have been normal.   RECOMMENDATIONS:  1. Stricter reflux maneuvers and twice a day Protonix 40 mg.  2. Sublingual Levsin p.r.n.  3. Trial of Carafate.  The patient says it has not helped, so we will      not repeat that at this time.  4. Followup ultrasound exam.  5. Will send this letter up to [facility], but patient      may need psychiatric referral for her rather marked anxiety levels      and claustrophobia.     [name]. Jarold Motto, MD, Caleen Essex, FAGA  Electronically Signed    DRP/MedQ  DD: 04/27/2007  DT: 04/27/2007  Job #: [id number]   cc:   Bertram Millard. Dahlstedt, M.D.  [facility]  Almedia Balls. Randell Patient, M.D.

## 2010-12-21 NOTE — Op Note (Signed)
[name], [name]                ACCOUNT NO.:  [id number]   MEDICAL RECORD NO.:  [id number]          PATIENT TYPE:  AMB   LOCATION:  SDC                           FACILITY:  [facility]   PHYSICIAN:  Guy Sandifer. Henderson Cloud, M.D. DATE OF BIRTH:  06-Jul-1956   DATE OF PROCEDURE:  10/29/2008  DATE OF DISCHARGE:                               OPERATIVE REPORT   PREOPERATIVE DIAGNOSIS:  Pelvic pain.   POSTOPERATIVE DIAGNOSIS:  Abdominopelvic adhesions and pelvic pain.   PROCEDURES:  Laparoscopy with bilateral salpingo-oophorectomy and  extensive lysis of adhesions.   SURGEON:  Guy Sandifer. Henderson Cloud, MD   ANESTHESIA:  General endotracheal intubation.   SPECIMENS:  Bilateral tubes and ovaries to pathology.   ESTIMATED BLOOD LOSS:  Minimal.   INDICATIONS AND CONSENT:  The patient is a 55 year old married white  female G3, P3 status post total abdominal hysterectomy, who is  postmenopausal.  She has pelvic pain.  Details are dictated in the  history and physical.  Laparoscopy, possible laparotomy with bilateral  salpingo-oophorectomy has been discussed with the patient  preoperatively.  Potential risks and complications were discussed  preoperatively including but not limited to infection, organ damage,  bleeding requiring transfusion of blood products with HIV and hepatitis  acquisition, DVT, PE, pneumonia, recurrent pelvic pain, dyspareunia.  All questions were answered and consent is signed on the chart.   FINDINGS:  Upper abdomen is grossly normal.  Uterus is surgically  absent.  There is a row of omental adhesions from the umbilicus down the  anterior peritoneal midline.  This does not involve the bowel.  There  are some filmy adhesions around the right tube and ovary.  There are  multiple adhesions of the sigmoid and the epiploicae over the pelvic  brim covering the left ovary as well as to the left ovary and fallopian  tube itself.   PROCEDURE:  The patient was taken to the operating room  where she was  identified, placed in dorsal supine position and general anesthesia was  induced via endotracheal intubation.  She was then placed in the dorsal  lithotomy position where she was prepped abdominally and vaginally,  bladder straight catheterized, and she was draped in a sterile fashion.  The infraumbilical and suprapubic areas were injected in the midline  with proximally 6 mL of 0.5% plain Marcaine.  An infraumbilical incision  was made and a disposable Veress needle was placed on first attempt  without difficulty.  Placement was verified with syringe and drop test.  2 liters of gas were then insufflated under low pressure with good  tympany in the right upper quadrant.  Veress needle was removed and a  10/11 Xcel bladeless disposable trocar sleeve was placed using direct  visualization with the diagnostic laparoscope.  This was then switched  to the operative laparoscope.  After careful inspection reveals that  there was no bowel involved in the omental adhesions.  The Metzenbaum-  type scissors and the EnSeal bipolar cautery cutting instrument was used  to release the adhesions at their point of insertion to the abdominal  wall, totally adhesions  were completely taken down.  Good hemostasis was  noted.  The adhesions surrounding the left ovary were then taken down.  The left ovary was released from its adhesions to the left pelvic  sidewall and the left infundibulopelvic ligament was then skeletonized  and isolated.  The EnSeal instrument was then used to take down the left  infundibulopelvic ligament and the left tube and ovary were cut free.  The course of the ureters seemed to be well clear of this.  The right  tube and ovary were then taken down in a similar fashion releasing the  adhesions taken down the infundibulopelvic ligament followed by the  remainder of the adhesions for the tube and ovary.  The 5-mm suprapubic  trocar sleeve was then replaced with a 10/11 Xcel  bladeless disposable  trocar sleeve that was placed under direct visualization.  Then using a  separate EndoCatch for each tube and ovary, the specimens were removed  through the suprapubic incisions without difficulty.  Irrigation was  carried out.  Inspection revealed good hemostasis all around.  20 mL of  plain 0.5% Marcaine was then instilled into the peritoneal cavity.  The  suprapubic trocar sleeve was removed and good hemostasis was noted.  Pneumoperitoneum was completely reduced and the umbilical trocar sleeve  was removed as well.  A 0 Vicryl suture was used to reapproximate the  deeper underlying layers over the incisions under good visualization.  3-  0 Vicryl subcuticular skin sutures were then placed to reapproximate the  skin.  Dermabond was placed on both incisions.  All counts correct.  The  patient was awakened and taken to recovery room in stable condition.      Guy Sandifer Henderson Cloud, M.D.  Electronically Signed     JET/MEDQ  D:  10/29/2008  T:  10/29/2008  Job:  [id number]

## 2010-12-24 NOTE — Op Note (Signed)
[place]  Patient:    [name]                        MRN: [id number] Proc. Date: 07/07/99 Adm. Date:  [id number] Attending:  Marlowe Shores                           Operative Report  PREOPERATIVE DIAGNOSIS:  Left long finger distal interphalangeal joint septic arthritis and osteomyelitis.  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  OPERATION:  Left long finger DIP arthrotomy with exploration and culturing. Bone biopsy radial aspect distal middle phalanx and partial nail excision left long finger.  SURGEON:  Artist Pais. Mina Marble, M.D.  ANESTHESIA:  Laryngeal mask airway.  TOURNIQUET TIME:  28 minutes.  COMPLICATIONS:  None.  DRAINS:  None.  CULTURES:  Cultures x 2 of bone and soft tissue sent.  DESCRIPTION OF PROCEDURE:  The patient was taken to the operating room after the induction of adequate laryngeal mask airway anesthesia, the left upper extremity is prepped and draped in the usual sterile fashion.  An Esmarch was used to exsanguinate the limb.  The tourniquet was inflated to 250 mmHg at this point and time.  A L-shaped incision was made over the dorsal aspect of the long finger distally  over the DIP flexion crease and a large radial-based flap was elevated.  The extensor tendon was identified and retracted to the ulnar side and the DIP arthrotomy was performed.  Cultures were sent of the DIP joint fluid.  There was an area on the distal aspect of the middle phalanx on the radial side that a x-ray had shown an erosive character.  This was biopsied using a rongeur and curet and the bone was sent for culturing.  The wound was thoroughly irrigated and the skin was then closed with 5-0 nylon nd simple sutures.  Attention was then paid to the radial nerve aspect of the nail where there was irregular nail growth and evidence of chronic  paronychia.  The eponychia fold was elevated off of the nail plate and the radial 20% of the nail plate was elevated off of the nail bed and removed in a longitudinal fashion.  Xeroform was then placed under the eponychial fold and over the exposed nail bed.  At this point and time, 0.25% plain Marcaine 3 cc was injected in the base of the long finger for postoperative pain control and then a sterile dressing of Xeroform, 4 by 4s, a Coban wrap, and a volar extension splint across the DIP joint was applied.  The patient tolerated the procedure well and went to the recovery room in stable fashion. DD:  07/07/99 TD:  07/08/99 Job: [id number]

## 2011-01-04 ENCOUNTER — Other Ambulatory Visit: Payer: Self-pay | Admitting: Gastroenterology

## 2011-03-09 ENCOUNTER — Encounter: Payer: Self-pay | Admitting: Cardiovascular Disease

## 2011-03-09 ENCOUNTER — Ambulatory Visit (INDEPENDENT_AMBULATORY_CARE_PROVIDER_SITE_OTHER): Payer: BC Managed Care – PPO | Admitting: Cardiovascular Disease

## 2011-03-09 VITALS — BP 140/80 | HR 58 | Ht 66.0 in | Wt 151.0 lb

## 2011-03-09 DIAGNOSIS — R55 Syncope and collapse: Secondary | ICD-10-CM

## 2011-03-09 NOTE — Assessment & Plan Note (Addendum)
[name] presents with episodes of syncope. These are fairly atypical. They typically occur if she is sitting quietly. She also had an episode in bed. She has never had episodes of syncope when she is active.  I discussed with her the possible connections between having a slow heart rate and/ or a low blood pressure. We will get an echocardiogram for further evaluation of her cardiac function. We'll also place an event monitor to evaluate her for the possibility of bradycardia.  Most of her episodes are associated with some nausea and a sensation that she has to have a bowel movement. We discussed the fact that these may be vasovagal stimulation due to the nausea. She has seen her gastroenterologist and has not had any explanation for her episodes of nausea.  I will see her again in several months for followup visit for her syncope.

## 2011-03-09 NOTE — Progress Notes (Signed)
[name] Date of Birth  Jun 11, 1956 [facility] [address]     [address] [place], [place]  [address] [phone]  Fax  [phone]  History of Present Illness:  55 year old female with a recent history of syncope.  These tends to occur with episodes of anxiety.  Had one episode of syncope after giving blood.  Also had an episode at the hospital when her grandson was being born.  His episodes are typically associated with her need to have a bowel movement.  She also has a sensation of nausea.  None of these episodes of syncope have occurred with exertion.  Denies any chest pain or shortness of breath.  Current Outpatient Prescriptions  Medication Sig Dispense Refill  . Acetaminophen (TYLENOL PO) Take by mouth.        . AMITIZA 24 MCG capsule TAKE ONE CAPSULE BY MOUTH TWICE DAILY  60 each  9  . clonazePAM (KLONOPIN) 0.5 MG tablet Take 0.5 mg by mouth 2 (two) times daily as needed.        Marland Kitchen escitalopram (LEXAPRO) 20 MG tablet Take 20 mg by mouth daily.        . pantoprazole (PROTONIX) 40 MG tablet Take 40 mg by mouth 2 (two) times daily. Only takes it QD          Allergies  Allergen Reactions  . Aspirin     REACTION: NAUSEA    Past Medical History  Diagnosis Date  . Bladder disease   . Syncope     Past Surgical History  Procedure Date  . Oophorectomy   . Cystectomy     History  Smoking status  . Former Smoker  . Types: Cigarettes  . Quit date: 08/09/1987  Smokeless tobacco  . Not on file    History  Alcohol Use  . Yes    Occas.    Family History  Problem Relation Age of Onset  . Heart attack Mother     Reviw of Systems:  Reviewed in the HPI.  All other systems are negative.  Physical Exam: BP 140/80  Pulse 58  Ht 5\' 6"  (1.676 m)  Wt 151 lb (68.493 kg)  BMI 24.37 kg/m2 The patient is alert and oriented x 3.  The mood and affect are normal.   Skin: warm and dry.  Color is normal.    HEENT:   the  sclera are nonicteric.  The mucous membranes are moist.  The carotids are 2+ without bruits.  There is no thyromegaly.  There is no JVD.    Lungs: clear.  The chest wall is non tender.    Heart: regular rate with a normal S1 and S2.  There are no murmurs, gallops, or rubs. The PMI is not displaced.     Abdomen: good bowel sounds.  There is no guarding or rebound.  There is no hepatosplenomegaly or tenderness.  There are no masses.   Extremities:  no clubbing, cyanosis, or edema.  The legs are without rashes.  The distal pulses are intact.   Neuro:  Cranial nerves II - XII are intact.  Motor and sensory functions are intact.    The gait is normal.  ECG:  Assessment / Plan:

## 2011-03-14 ENCOUNTER — Ambulatory Visit (HOSPITAL_COMMUNITY): Payer: BC Managed Care – PPO | Attending: Cardiovascular Disease | Admitting: Radiology

## 2011-03-14 DIAGNOSIS — R55 Syncope and collapse: Secondary | ICD-10-CM | POA: Insufficient documentation

## 2011-03-14 DIAGNOSIS — F411 Generalized anxiety disorder: Secondary | ICD-10-CM | POA: Insufficient documentation

## 2011-03-14 DIAGNOSIS — Z87891 Personal history of nicotine dependence: Secondary | ICD-10-CM | POA: Insufficient documentation

## 2011-03-17 ENCOUNTER — Telehealth: Payer: Self-pay | Admitting: Cardiovascular Disease

## 2011-03-17 NOTE — Telephone Encounter (Signed)
Pt had an echo on Monday. She wants to know the results.

## 2011-03-17 NOTE — Telephone Encounter (Signed)
Gave her normal report on her echo, pt continues to wear heart monitor w/o episodes.

## 2011-03-18 ENCOUNTER — Telehealth: Payer: Self-pay | Admitting: *Deleted

## 2011-03-18 NOTE — Telephone Encounter (Signed)
Message copied by Antony Odea on Fri Mar 18, 2011  4:39 PM ------      Message from: Vesta Mixer      Created: Tue Mar 15, 2011  8:11 AM       Echo looks good.

## 2011-03-18 NOTE — Telephone Encounter (Signed)
Made several attempts to reach through week, msg left to call office with questions to normal test result.

## 2011-04-08 ENCOUNTER — Encounter: Payer: BC Managed Care – PPO | Admitting: *Deleted

## 2011-04-13 ENCOUNTER — Encounter: Payer: Self-pay | Admitting: Cardiovascular Disease

## 2011-04-26 ENCOUNTER — Encounter: Payer: Self-pay | Admitting: Cardiovascular Disease

## 2011-06-09 ENCOUNTER — Ambulatory Visit: Payer: BC Managed Care – PPO | Admitting: Cardiovascular Disease

## 2011-06-20 ENCOUNTER — Ambulatory Visit: Payer: BC Managed Care – PPO | Admitting: Cardiovascular Disease

## 2011-07-07 ENCOUNTER — Telehealth: Payer: Self-pay | Admitting: Gastroenterology

## 2011-07-07 NOTE — Telephone Encounter (Signed)
Pt with a hx of Celiac disease; last OV 09/14/10 and ECL March , 2012. Pt reports terrible reflux x 1 month; she increased her Protonix to bid with no help. Hyoscyamine isn't helping with the pain either. Pt reports she ate one [employer] stick and that's what started everything, plus she's been under a lot of stress. All she's been able to eat has been eggs, rice cakes and GF toast. Pt thinks Aciphex is what she needs. Informed pt it will be in the am before I have an answer and she stated understanding. Please advise. Thanks.

## 2011-07-08 NOTE — Telephone Encounter (Signed)
Spoke with pt to inform her Dr Jarold Motto stated it's ok for her to switch to Aciphex. Pt states the problem is her insurance will not pay for the Aciphex  until next year. I could not find any samples for her. Pt will continue Protonix bid to see if that helps.

## 2011-07-08 NOTE — Telephone Encounter (Signed)
Ok for aciphex...needs yearly f/u

## 2011-07-20 ENCOUNTER — Ambulatory Visit: Payer: BC Managed Care – PPO | Admitting: Cardiovascular Disease

## 2011-07-20 ENCOUNTER — Encounter: Payer: Self-pay | Admitting: Cardiovascular Disease

## 2011-07-20 ENCOUNTER — Ambulatory Visit (INDEPENDENT_AMBULATORY_CARE_PROVIDER_SITE_OTHER): Payer: BC Managed Care – PPO | Admitting: Cardiovascular Disease

## 2011-07-20 DIAGNOSIS — R55 Syncope and collapse: Secondary | ICD-10-CM

## 2011-07-20 NOTE — Assessment & Plan Note (Addendum)
[name] is doing well from a cardiac standpoint. Her echocardiogram reveals normal left ventricular systolic function. She does have some diastolic dysfunction. Her heart rate and blood pressure are normal.  She admits that a lot of her weakness it is due to depression. At this point I don't think that any of her complaints have a cardiac etiology. We will see her on an as-needed basis. I've asked her call me if she has any other problems.

## 2011-07-20 NOTE — Patient Instructions (Signed)
Your physician recommends that you schedule a follow-up appointment in: AS NEEDED BASIS  

## 2011-07-20 NOTE — Progress Notes (Signed)
[name] Date of Birth  1955/10/22 [facility] / [facility] [address]     [address] [place], [place]  [address] [phone]  Fax  [phone]  History of Present Illness:  55 year old female with a recent history of syncope.  These tends to occur with episodes of anxiety.  Had one episode of syncope after giving blood.  Also had an episode at the hospital when her grandson was being born.  His episodes are typically associated with her need to have a bowel movement.  She also has a sensation of nausea.  None of these episodes of syncope have occurred with exertion.  Denies any chest pain or shortness of breath.  Since I last saw her she had an echocardiogram which was normal. She continues to have lots of fatigue.   She admits that she does not sleep well at night.    Current Outpatient Prescriptions  Medication Sig Dispense Refill  . Acetaminophen (TYLENOL PO) Take by mouth.        . AMITIZA 24 MCG capsule TAKE ONE CAPSULE BY MOUTH TWICE DAILY  60 each  9  . clonazePAM (KLONOPIN) 0.5 MG tablet Take 0.5 mg by mouth 2 (two) times daily as needed.        Marland Kitchen escitalopram (LEXAPRO) 20 MG tablet Take 20 mg by mouth daily.        . pantoprazole (PROTONIX) 40 MG tablet Take 40 mg by mouth 2 (two) times daily. Only takes it QD          Allergies  Allergen Reactions  . Aspirin     REACTION: NAUSEA    Past Medical History  Diagnosis Date  . Bladder disease   . Syncope     Past Surgical History  Procedure Date  . Oophorectomy   . Cystectomy     History  Smoking status  . Former Smoker  . Types: Cigarettes  . Quit date: 08/09/1987  Smokeless tobacco  . Not on file    History  Alcohol Use  . Yes    Occas.    Family History  Problem Relation Age of Onset  . Heart attack Mother     Reviw of Systems:  Reviewed in the HPI.  All other systems are negative.  Physical Exam: BP 124/58  Pulse 72  Resp 18  Ht 5\' 6"  (1.676 m)  Wt  153 lb (69.4 kg)  BMI 24.69 kg/m2 The patient is alert and oriented x 3.  The mood and affect are normal.   Skin: warm and dry.  Color is normal.    HEENT:   the sclera are nonicteric.  The mucous membranes are moist.  The carotids are 2+ without bruits.  There is no thyromegaly.  There is no JVD.    Lungs: clear.  The chest wall is non tender.    Heart: regular rate with a normal S1 and S2.  There are no murmurs, gallops, or rubs. The PMI is not displaced.     Abdomen: good bowel sounds.  There is no guarding or rebound.  There is no hepatosplenomegaly or tenderness.  There are no masses.   Extremities:  no clubbing, cyanosis, or edema.  The legs are without rashes.  The distal pulses are intact.   Neuro:  Cranial nerves II - XII are intact.  Motor and sensory functions are intact.    The gait is normal.  ECG:  Assessment / Plan:

## 2011-08-12 ENCOUNTER — Other Ambulatory Visit: Payer: Self-pay | Admitting: Gastroenterology

## 2011-08-12 MED ORDER — CLONAZEPAM 0.5 MG PO TABS: 0.5000 mg | ORAL_TABLET | Freq: Two times a day (BID) | ORAL | Status: DC | PRN

## 2011-08-12 NOTE — Telephone Encounter (Signed)
Advised pt we will fax on 3 month supply yo [facility] but she needs office visit.

## 2011-09-12 ENCOUNTER — Telehealth: Payer: Self-pay | Admitting: *Deleted

## 2011-09-12 NOTE — Telephone Encounter (Signed)
Message copied by Leonette Monarch on Mon Sep 12, 2011  8:45 AM ------      Message from: Harlow Mares D      Created: Wed Oct 13, 2010 10:03 AM       Yearly OV

## 2011-09-22 ENCOUNTER — Encounter: Payer: Self-pay | Admitting: *Deleted

## 2011-09-27 ENCOUNTER — Ambulatory Visit (INDEPENDENT_AMBULATORY_CARE_PROVIDER_SITE_OTHER): Payer: BC Managed Care – PPO | Admitting: Gastroenterology

## 2011-09-27 ENCOUNTER — Encounter: Payer: Self-pay | Admitting: Gastroenterology

## 2011-09-27 ENCOUNTER — Other Ambulatory Visit (INDEPENDENT_AMBULATORY_CARE_PROVIDER_SITE_OTHER): Payer: BC Managed Care – PPO

## 2011-09-27 DIAGNOSIS — F329 Major depressive disorder, single episode, unspecified: Secondary | ICD-10-CM | POA: Insufficient documentation

## 2011-09-27 DIAGNOSIS — F419 Anxiety disorder, unspecified: Secondary | ICD-10-CM

## 2011-09-27 DIAGNOSIS — K219 Gastro-esophageal reflux disease without esophagitis: Secondary | ICD-10-CM

## 2011-09-27 DIAGNOSIS — F341 Dysthymic disorder: Secondary | ICD-10-CM

## 2011-09-27 DIAGNOSIS — K589 Irritable bowel syndrome without diarrhea: Secondary | ICD-10-CM

## 2011-09-27 DIAGNOSIS — F32A Depression, unspecified: Secondary | ICD-10-CM

## 2011-09-27 LAB — IBC PANEL
Iron: 25 ug/dL — ABNORMAL LOW (ref 42–145)
Saturation Ratios: 5.3 % — ABNORMAL LOW (ref 20.0–50.0)
Transferrin: 340.1 mg/dL (ref 212.0–360.0)

## 2011-09-27 LAB — HEPATIC FUNCTION PANEL
ALT: 20 U/L (ref 0–35)
Alkaline Phosphatase: 69 U/L (ref 39–117)
Bilirubin, Direct: 0 mg/dL (ref 0.0–0.3)
Total Bilirubin: 0.3 mg/dL (ref 0.3–1.2)
Total Protein: 7.3 g/dL (ref 6.0–8.3)

## 2011-09-27 LAB — CBC WITH DIFFERENTIAL/PLATELET
Basophils Absolute: 0 10*3/uL (ref 0.0–0.1)
Basophils Relative: 0.8 % (ref 0.0–3.0)
Eosinophils Absolute: 0.1 10*3/uL (ref 0.0–0.7)
Lymphocytes Relative: 36.4 % (ref 12.0–46.0)
MCHC: 32.5 g/dL (ref 30.0–36.0)
MCV: 80.9 fl (ref 78.0–100.0)
Monocytes Absolute: 0.5 10*3/uL (ref 0.1–1.0)
Neutro Abs: 3 10*3/uL (ref 1.4–7.7)
Neutrophils Relative %: 52.5 % (ref 43.0–77.0)
RBC: 4.38 Mil/uL (ref 3.87–5.11)
RDW: 18.1 % — ABNORMAL HIGH (ref 11.5–14.6)

## 2011-09-27 LAB — BASIC METABOLIC PANEL
CO2: 28 mEq/L (ref 19–32)
Chloride: 104 mEq/L (ref 96–112)
Glucose, Bld: 85 mg/dL (ref 70–99)
Potassium: 4.2 mEq/L (ref 3.5–5.1)
Sodium: 140 mEq/L (ref 135–145)

## 2011-09-27 LAB — FOLATE: Folate: 4.4 ng/mL — ABNORMAL LOW (ref >5.9)

## 2011-09-27 LAB — LIPASE: Lipase: 37 U/L (ref 11.0–59.0)

## 2011-09-27 LAB — SEDIMENTATION RATE: Sed Rate: 34 mm/hr — ABNORMAL HIGH (ref 0–22)

## 2011-09-27 LAB — VITAMIN B12: Vitamin B-12: 880 pg/mL (ref 211–911)

## 2011-09-27 MED ORDER — HYOSCYAMINE-PHENYLTOLOXAMINE 0.0625-15 MG PO CAPS: ORAL_CAPSULE | ORAL | Status: DC

## 2011-09-27 MED ORDER — PANTOPRAZOLE SODIUM 40 MG PO TBEC: 40.0000 mg | DELAYED_RELEASE_TABLET | Freq: Two times a day (BID) | ORAL | Status: DC

## 2011-09-27 MED ORDER — HYOSCYAMINE SULFATE 0.125 MG PO TABS: 0.1250 mg | ORAL_TABLET | ORAL | Status: DC | PRN

## 2011-09-27 MED ORDER — CLONAZEPAM 0.5 MG PO TABS: 0.5000 mg | ORAL_TABLET | Freq: Two times a day (BID) | ORAL | Status: DC | PRN

## 2011-09-27 MED ORDER — LUBIPROSTONE 24 MCG PO CAPS: 24.0000 ug | ORAL_CAPSULE | Freq: Two times a day (BID) | ORAL | Status: DC

## 2011-09-27 NOTE — Progress Notes (Signed)
This is a  56 year old Caucasian female with chronic acid reflux, constipation predominant IBS, and mild chronic gluten sensitivity. She also suffers from chronic anxiety and depression. Since institution of Tamiflu, she has had worsening GI complaints of acid reflux, gas, bloating, and food intolerances. Also she's had excessive stress revolving around the marital problems of her children. She is double her Protonix to 40 mg twice a day and uses when necessary Levsin with good response. Patient also is on Lexapro and Klonopin. She denies any specific hepatobiliary or systemic complaints.  Current Medications, Allergies, Past Medical History, Past Surgical History, Family History and Social History were reviewed in [employer] record.  Pertinent Review of Systems Negative   Physical Exam: Healthy patient with blood pressure 130/68. BMI is 25.3. I cannot appreciate stigmata of chronic liver disease. Her chest is completely clear to P&A, she is in a regular rhythm without murmurs gallops or rubs, abdomen is soft and nontender without organomegaly, masses, or abnormal bowel sounds. Peripheral extremities are unremarkable, mental status is normal.      Assessment and Plan: Worsening IBS and acid reflux related to recent flulike illness with institution of Tamiflu medication. I've asked her to continue Protonix 40 mg a day, and I prescribed Digex anti-spasmodic IBS medication. I also placed her on the FODMAP IBS diet per gluten sensitivity. Labs have been ordered at her request for review. She is concerned about chronic thyroid dysfunction. I have no pertinent labs since last February. She is up-to-date on her endoscopy and colonoscopy exams. Standard antireflux maneuvers reviewed. Encounter Diagnoses  Name Primary?  . Esophageal reflux   . Irritable bowel syndrome

## 2011-09-27 NOTE — Patient Instructions (Signed)
Please go to the basement today for your labs.  Follow the FODMAP diet given today, Try the samples of Digex given today call back if they work  I have sent the Amitiza to [facility]. I have sent Protonix, Klonopin, and Levisin to [facility].

## 2011-09-28 ENCOUNTER — Other Ambulatory Visit: Payer: Self-pay | Admitting: Gastroenterology

## 2011-09-28 LAB — VITAMIN D 25 HYDROXY (VIT D DEFICIENCY, FRACTURES): Vit D, 25-Hydroxy: 35 ng/mL (ref 30–89)

## 2011-09-28 MED ORDER — FERROUS FUM-IRON POLYSACCH 162-115.2 MG PO CAPS: 1.0000 | ORAL_CAPSULE | Freq: Every day | ORAL | Status: DC

## 2011-09-28 MED ORDER — FOLIC ACID 1 MG PO TABS: 1.0000 mg | ORAL_TABLET | Freq: Every day | ORAL | Status: DC

## 2011-09-29 LAB — CELIAC PANEL 10
Deamidated Gliadin Abs, IgG: 3.6 U/mL
Endomysial Screen: NEGATIVE
Gliadin IgA: 7 U/mL
IgA: 288 mg/dL (ref 69–380)
Tissue Transglutaminase Ab, IgA: 6.3 U/mL
t-Transglutaminase (tTG) IgG: 6.2 U/mL

## 2011-10-10 ENCOUNTER — Telehealth: Payer: Self-pay | Admitting: Gastroenterology

## 2011-10-10 NOTE — Telephone Encounter (Signed)
Pt states that mail order sent her name brand on her Levsin not generic I advised her that I have sent in generic not sure why they would have filled name brand, she can call them and they can call me if needed.

## 2011-10-11 NOTE — Telephone Encounter (Signed)
Multiple attempts to contact pt with no return call back

## 2011-10-12 ENCOUNTER — Other Ambulatory Visit: Payer: BC Managed Care – PPO

## 2011-10-12 DIAGNOSIS — Z1211 Encounter for screening for malignant neoplasm of colon: Secondary | ICD-10-CM

## 2011-10-12 LAB — FECAL OCCULT BLOOD, IMMUNOCHEMICAL: Fecal Occult Bld: NEGATIVE

## 2011-10-13 ENCOUNTER — Telehealth: Payer: Self-pay | Admitting: Gastroenterology

## 2011-10-13 NOTE — Telephone Encounter (Signed)
Pt called just to let us know that her last script for hyoscyamine,  [facility] sent a fax to Dr Jarold Motto who signed for the name brand, Levsin and she was charged $120. She spent a couple of days trying to straighten out the mess but still had to pay the money. She wants [place] to know it wasn't our fault.

## 2012-06-14 ENCOUNTER — Other Ambulatory Visit: Payer: Self-pay | Admitting: *Deleted

## 2012-06-14 MED ORDER — CLONAZEPAM 0.5 MG PO TABS: 0.5000 mg | ORAL_TABLET | Freq: Two times a day (BID) | ORAL | Status: DC | PRN

## 2012-06-26 ENCOUNTER — Telehealth: Payer: Self-pay | Admitting: Gastroenterology

## 2012-06-26 MED ORDER — CLONAZEPAM 0.5 MG PO TABS: 0.5000 mg | ORAL_TABLET | Freq: Two times a day (BID) | ORAL | Status: DC | PRN

## 2012-06-26 NOTE — Telephone Encounter (Signed)
Called patient to ask her what medication did have directions on how to take. Patient stated Klonopin.  In our system it was sent to [facility] with directions on how to take.  Called [facility] at [phone] and spoke with Rosezella [place]... Misty Stanley transferred me to physician line services  At physician line services I spoke with Alecia Lemming and Alecia Lemming transferred me to [place].  Per Silvio Pate medication representative with physician line services told me that fax was sent for prescription and that it was denied because it can be done electronically   New prescription sent

## 2012-06-27 NOTE — Telephone Encounter (Signed)
Faxed prescription again to [facility] to [phone]

## 2012-08-03 ENCOUNTER — Other Ambulatory Visit: Payer: Self-pay | Admitting: *Deleted

## 2012-08-03 ENCOUNTER — Telehealth: Payer: Self-pay | Admitting: *Deleted

## 2012-08-03 MED ORDER — CYCLOBENZAPRINE HCL 10 MG PO TABS: 10.0000 mg | ORAL_TABLET | Freq: Every day | ORAL | Status: DC | PRN

## 2012-08-03 NOTE — Progress Notes (Signed)
Pt states she uses [facility]- and had flexeril 10 mg previously.

## 2012-08-03 NOTE — Telephone Encounter (Signed)
Refilled per Dr. Draper. 

## 2012-08-03 NOTE — Telephone Encounter (Signed)
Message copied by Mora Bellman on Fri Aug 03, 2012  2:41 PM ------      Message from: Ralene Cork      Created: Fri Aug 03, 2012 12:26 PM      Regarding: med refill      Contact: [phone]       Ok to refill once. Call to find out the dosage or which pharmacy I've used for her in the past. OK to give 20 pills. Will need appt to see me if further refills are needed.      ----- Message -----         From: Claiborne Billings         Sent: 08/02/2012   4:12 PM           To: Ralene Cork, DO            [phone]  Home      Seen at [facility].  Was moving furniture and needs refill on generic flexeril.

## 2012-08-08 ENCOUNTER — Other Ambulatory Visit: Payer: Self-pay | Admitting: Gastroenterology

## 2012-10-04 ENCOUNTER — Other Ambulatory Visit: Payer: Self-pay | Admitting: Orthopedic Surgery

## 2012-10-04 ENCOUNTER — Encounter: Payer: Self-pay | Admitting: Gastroenterology

## 2012-10-04 ENCOUNTER — Ambulatory Visit (INDEPENDENT_AMBULATORY_CARE_PROVIDER_SITE_OTHER): Payer: BC Managed Care – PPO | Admitting: Gastroenterology

## 2012-10-04 ENCOUNTER — Ambulatory Visit: Payer: BC Managed Care – PPO | Admitting: Gastroenterology

## 2012-10-04 ENCOUNTER — Other Ambulatory Visit (INDEPENDENT_AMBULATORY_CARE_PROVIDER_SITE_OTHER): Payer: BC Managed Care – PPO

## 2012-10-04 VITALS — BP 128/80 | HR 72 | Ht 66.0 in | Wt 168.2 lb

## 2012-10-04 DIAGNOSIS — M542 Cervicalgia: Secondary | ICD-10-CM

## 2012-10-04 DIAGNOSIS — D649 Anemia, unspecified: Secondary | ICD-10-CM

## 2012-10-04 DIAGNOSIS — I7300 Raynaud's syndrome without gangrene: Secondary | ICD-10-CM

## 2012-10-04 DIAGNOSIS — K900 Celiac disease: Secondary | ICD-10-CM

## 2012-10-04 DIAGNOSIS — K219 Gastro-esophageal reflux disease without esophagitis: Secondary | ICD-10-CM

## 2012-10-04 DIAGNOSIS — K9041 Non-celiac gluten sensitivity: Secondary | ICD-10-CM

## 2012-10-04 DIAGNOSIS — K5900 Constipation, unspecified: Secondary | ICD-10-CM

## 2012-10-04 DIAGNOSIS — E559 Vitamin D deficiency, unspecified: Secondary | ICD-10-CM

## 2012-10-04 LAB — CBC WITH DIFFERENTIAL/PLATELET
Basophils Absolute: 0 10*3/uL (ref 0.0–0.1)
HCT: 39.5 % (ref 36.0–46.0)
Hemoglobin: 13 g/dL (ref 12.0–15.0)
Lymphs Abs: 2.2 10*3/uL (ref 0.7–4.0)
MCHC: 33 g/dL (ref 30.0–36.0)
MCV: 84.2 fl (ref 78.0–100.0)
Monocytes Absolute: 0.4 10*3/uL (ref 0.1–1.0)
Monocytes Relative: 7.1 % (ref 3.0–12.0)
Neutro Abs: 3 10*3/uL (ref 1.4–7.7)
Platelets: 251 10*3/uL (ref 150.0–400.0)
RDW: 15.4 % — ABNORMAL HIGH (ref 11.5–14.6)

## 2012-10-04 LAB — COMPREHENSIVE METABOLIC PANEL
ALT: 27 U/L (ref 0–35)
AST: 24 U/L (ref 0–37)
Alkaline Phosphatase: 93 U/L (ref 39–117)
CO2: 28 mEq/L (ref 19–32)
Creatinine, Ser: 0.8 mg/dL (ref 0.4–1.2)
GFR: 84.17 mL/min (ref >60.00)
Sodium: 139 mEq/L (ref 135–145)
Total Bilirubin: 0.3 mg/dL (ref 0.3–1.2)
Total Protein: 7.6 g/dL (ref 6.0–8.3)

## 2012-10-04 LAB — FERRITIN: Ferritin: 7.3 ng/mL — ABNORMAL LOW (ref 10.0–291.0)

## 2012-10-04 LAB — HEPATIC FUNCTION PANEL
AST: 24 U/L (ref 0–37)
Albumin: 4 g/dL (ref 3.5–5.2)
Alkaline Phosphatase: 93 U/L (ref 39–117)
Total Protein: 7.6 g/dL (ref 6.0–8.3)

## 2012-10-04 LAB — VITAMIN B12: Vitamin B-12: 568 pg/mL (ref 211–911)

## 2012-10-04 LAB — MAGNESIUM: Magnesium: 2 mg/dL (ref 1.5–2.5)

## 2012-10-04 IMAGING — US US ABDOMEN COMPLETE
1 series · 13 of 25 positions shown · non-contrast
Comparison: [HOSPITAL] abdominal ultrasound 12/29/2003,
abdominal MRI 09/18/2004, abdominal ultrasound 08/12/2008 and
08/17/2009.

CLINICAL DATA: Reevaluate hepatic cyst.

COMPLETE ABDOMINAL ULTRASOUND

[Series 1: us abdomen complete · 0.23mm/px · 13 of 86 slices shown]
[im 1/86]
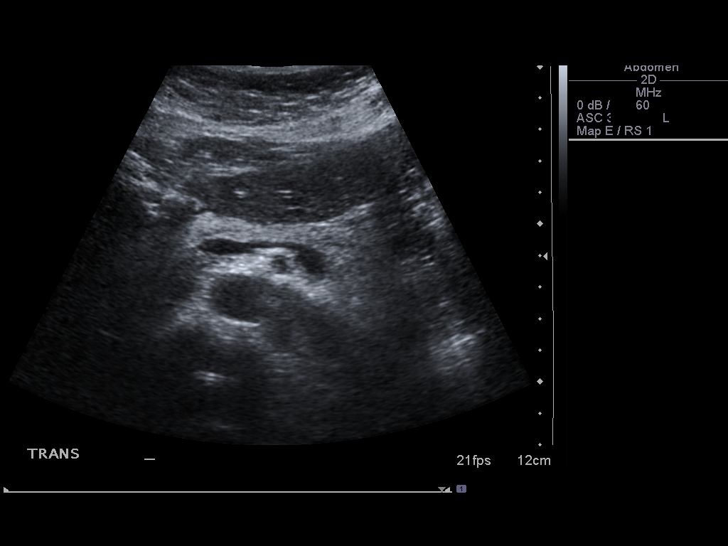
[im 8/86]
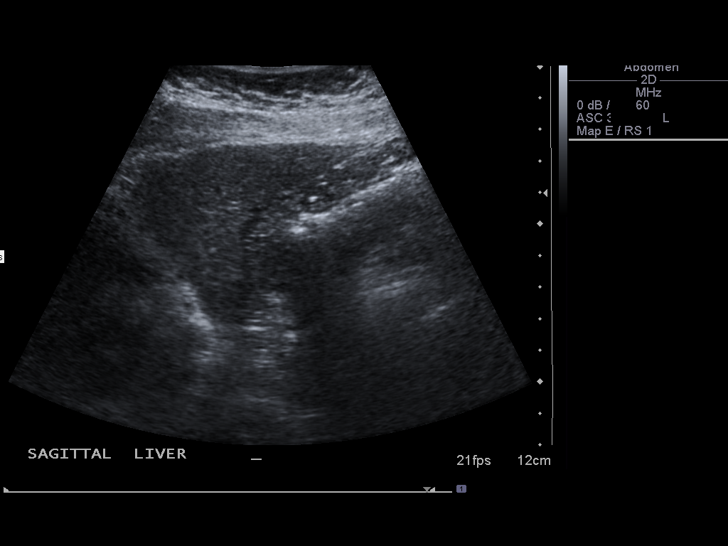
[im 15/86]
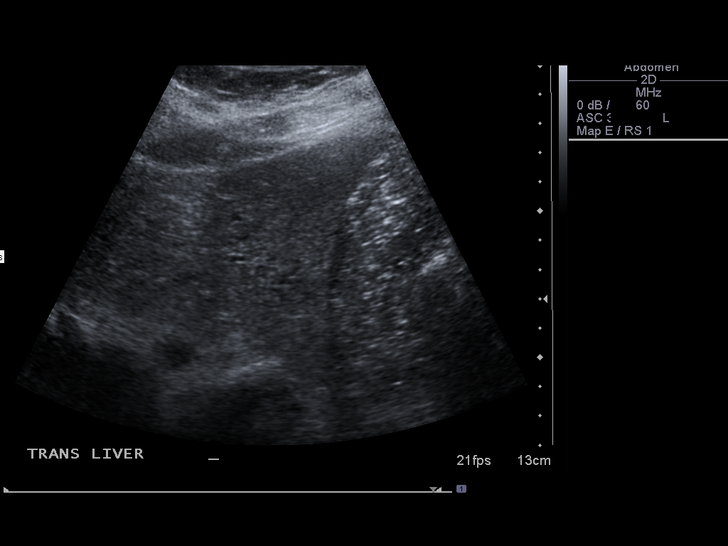
[im 22/86]
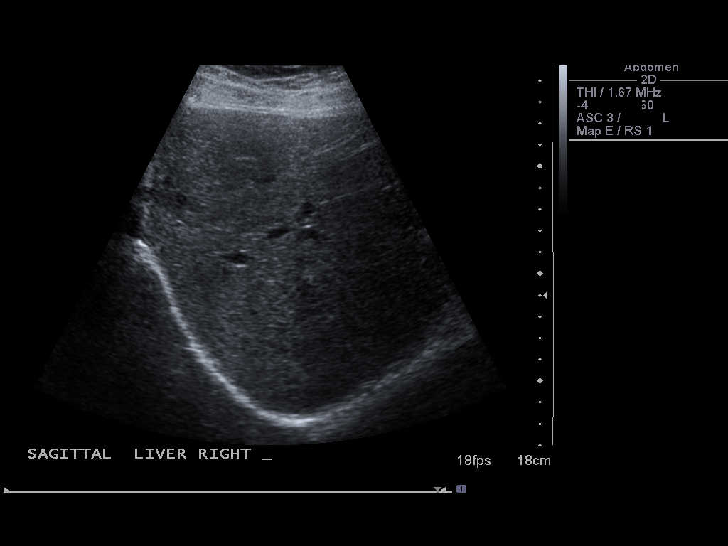
[im 29/86]
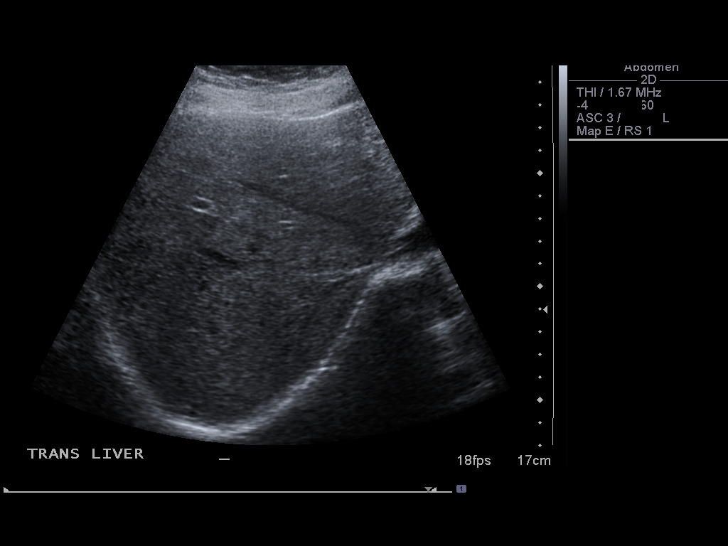
[im 36/86]
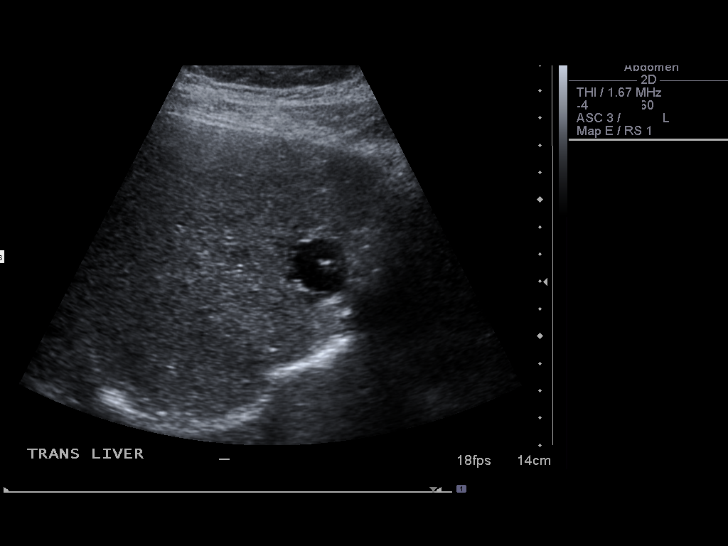
[im 43/86]
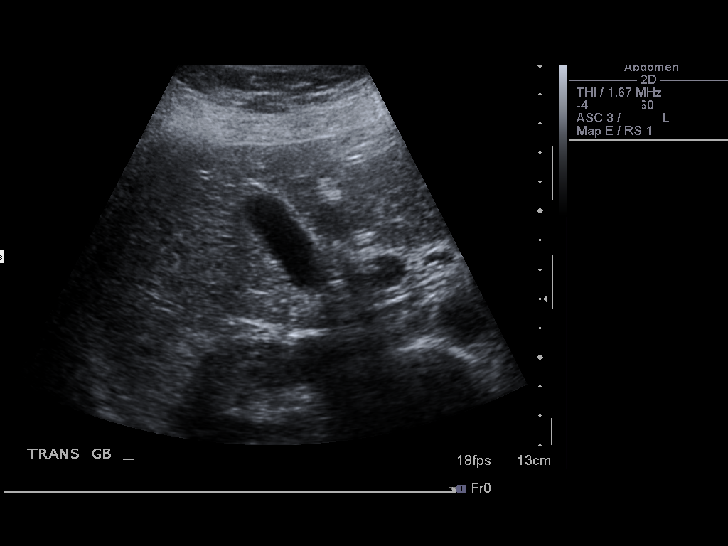
[im 50/86]
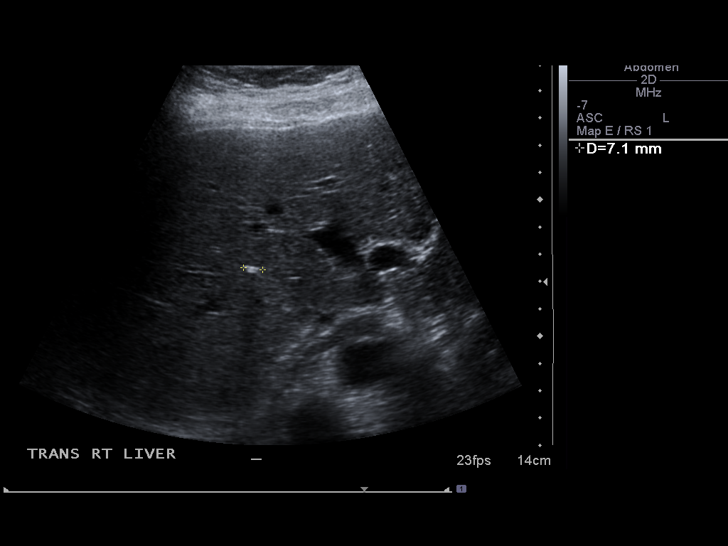
[im 57/86]
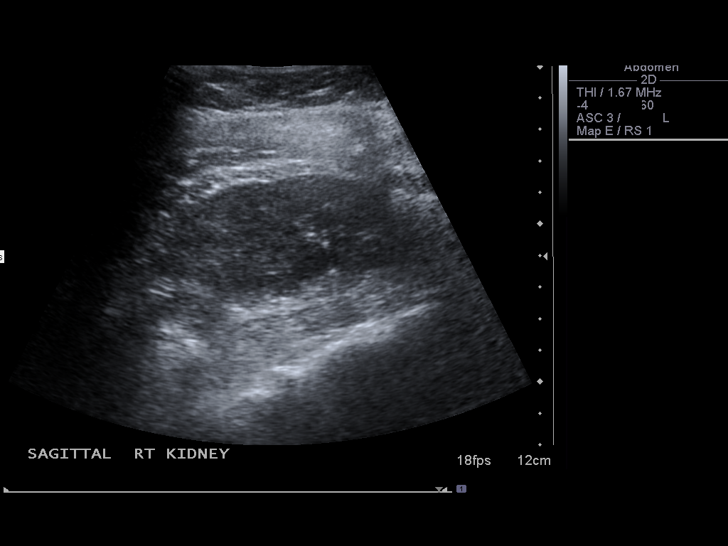
[im 64/86]
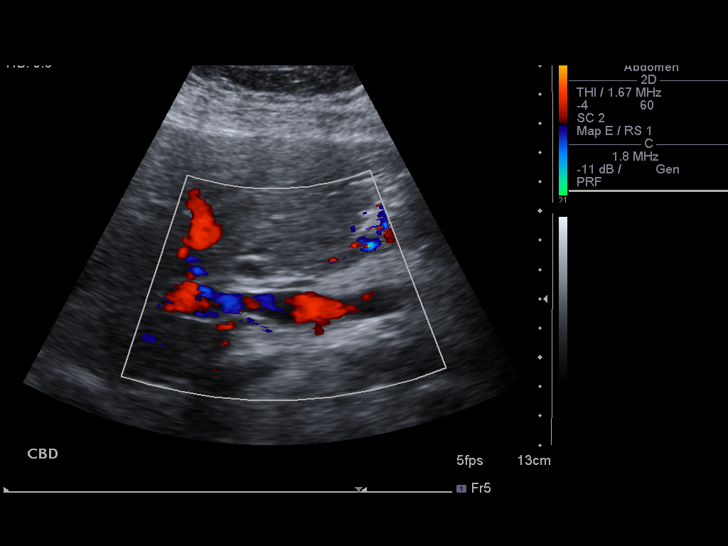
[im 71/86]
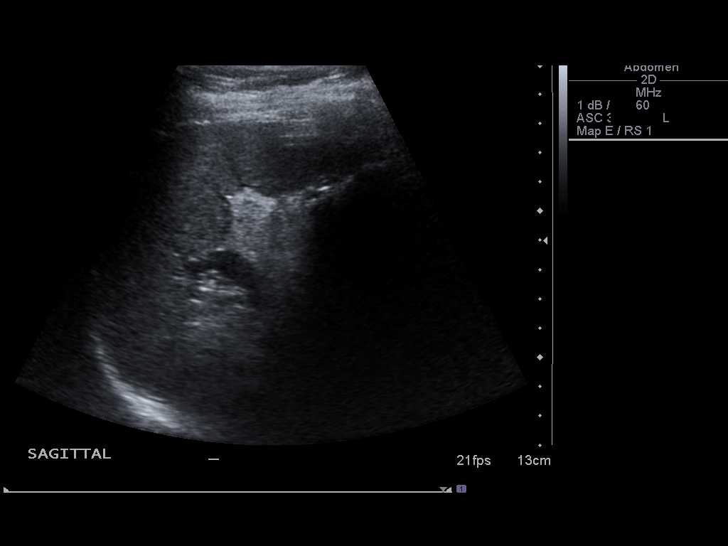
[im 78/86]
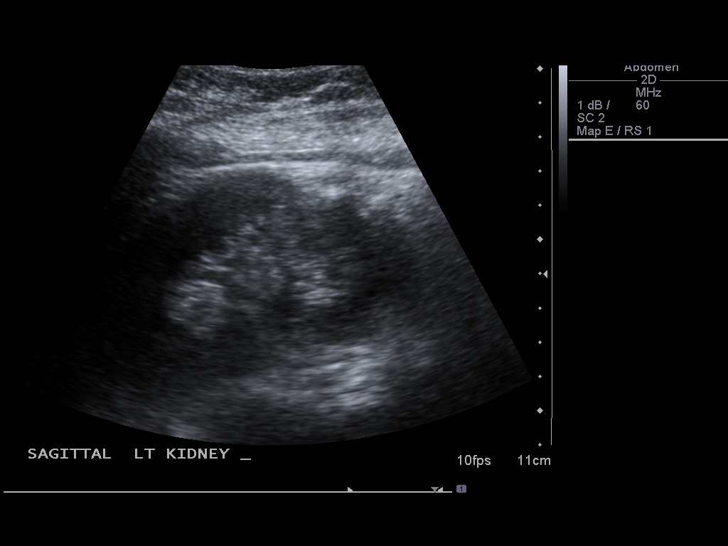
[im 86/86]
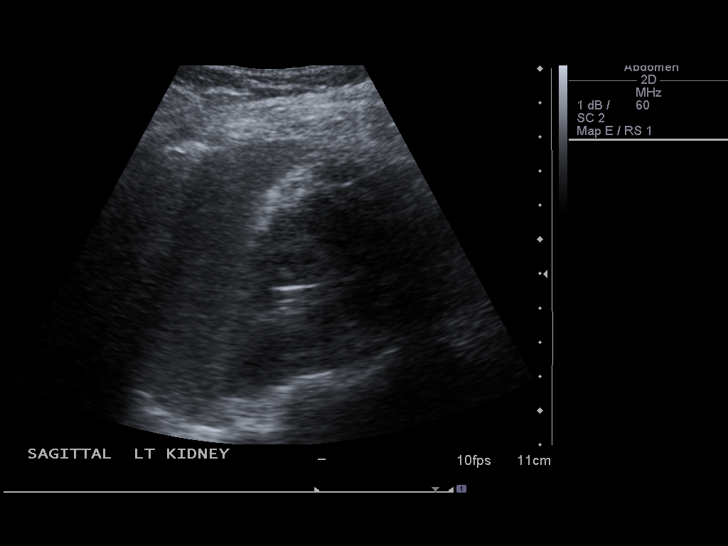

[13 of 25 positions shown; findings below may reference images not displayed]

FINDINGS: Gallbladder:  Remains sonographically normal without sludge or
gallstones with normal 2 mm wall thickness and no sonographic
Murphy's sign evoked.

Common bile duct:  No dilated intrahepatic or extrahepatic bile
ducts seen with common bile duct measuring normally at 2 mm.

Liver:  Since prior studies regression in size of septated benign
cyst superior right hepatic lobe currently measuring 2.4 cm long X
2.3 cm AP X 2.2 cm wide.  Stable 8 mm shadowing right hepatic
benign calcifications seen.  Liver remains normal in size and
echotexture with no new focal solid lesion.

IVC:  Appears normal.

Pancreas:  No focal abnormality seen.

Spleen:  Sonographically normal measuring 8.9 cm long.

Right Kidney:  Sonographically normal measuring 10.7 cm long.

Left Kidney:  Sonographically normal measuring 10.8 cm long.

Abdominal aorta:  No aneurysm identified with maximum proximal
diameter 2.1 cm.

No free fluid.
IMPRESSION: 1.  Regression in size of benign septated 2.4 cm right hepatic cyst
(largest dimension 3.8 cm 08/12/2008) and right hepatic 8 mm
calcification/granuloma.  No further imaging surveillance deemed
necessary unless otherwise clinically indicated.
2.  Stable - negative.

## 2012-10-04 MED ORDER — DEXLANSOPRAZOLE 60 MG PO CPDR: 60.0000 mg | DELAYED_RELEASE_CAPSULE | Freq: Every day | ORAL | Status: DC

## 2012-10-04 NOTE — Patient Instructions (Addendum)
Your physician has requested that you go to the basement for lab work before leaving today.  Please stop Protonix and start Dexilant. Take one capsule by mouth once daily. Samples were given today, if this works well for you please call back for a prescription.   Information on GERD is below.  Information on Esophageal Manometry is below.  You were given samples today of a probiotic VSL 3. Please take one capsule by mouth once daily. Please keep in refrigerator  __________________________________________________________________________________________________________  You have been scheduled for an esophageal manometry at [facility] on 10-22-2012 at 1 PM. Please arrive 30 minutes prior to your procedure for registration. You will need to go to outpatient registration (1st floor of the hospital) first. Make certain to bring your insurance cards as well as a complete list of medications.  Please remember the following:  1) Nothing to eat or drink after 12:00 midnight on the night before your test.  2) Hold all diabetic medications/insulin the morning of the test. You may eat and take your medications after the test.  3) For 3 days prior to your test do not take: Dexilant, Prevacid, Nexium, Protonix, Aciphex, Zegerid, Pantoprazole, Prilosec or omeprazole.  4) For 2 days prior to your test, do not take: Reglan, Tagamet, Zantac, Axid or Pepcid.  5) You MAY use an antacid such as Rolaids or Tums up to 12 hours prior to your test.  It will take at least 2 weeks to receive the results of this test from your physician. ------------------------------------------ ABOUT ESOPHAGEAL MANOMETRY Esophageal manometry (muh-NOM-uh-tree) is a test that gauges how well your esophagus works. Your esophagus is the long, muscular tube that connects your throat to your stomach. Esophageal manometry measures the rhythmic muscle contractions (peristalsis) that occur in your esophagus when you swallow.  Esophageal manometry also measures the coordination and force exerted by the muscles of your esophagus.  During esophageal manometry, a thin, flexible tube (catheter) that contains sensors is passed through your nose, down your esophagus and into your stomach. Esophageal manometry can be helpful in diagnosing some mostly uncommon disorders that affect your esophagus.  Why it's done Esophageal manometry is used to evaluate the movement (motility) of food through the esophagus and into the stomach. The test measures how well the circular bands of muscle (sphincters) at the top and bottom of your esophagus open and close, as well as the pressure, strength and pattern of the wave of esophageal muscle contractions that moves food along.  What you can expect Esophageal manometry is an outpatient procedure done without sedation. Most people tolerate it well. You may be asked to change into a hospital gown before the test starts.  During esophageal manometry  While you are sitting up, a member of your health care team sprays your throat with a numbing medication or puts numbing gel in your nose or both.  A catheter is guided through your nose into your esophagus. The catheter may be sheathed in a water-filled sleeve. It doesn't interfere with your breathing. However, your eyes may water, and you may gag. You may have a slight nosebleed from irritation.  After the catheter is in place, you may be asked to lie on your back on an exam table, or you may be asked to remain seated.  You then swallow small sips of water. As you do, a computer connected to the catheter records the pressure, strength and pattern of your esophageal muscle contractions.  During the test, you'll be asked to  breathe slowly and smoothly, remain as still as possible, and swallow only when you're asked to do so.  A member of your health care team may move the catheter down into your stomach while the catheter continues its measurements.  The  catheter then is slowly withdrawn. The test usually lasts 20 to 30 minutes.  After esophageal manometry  When your esophageal manometry is complete, you may return to your normal activities  This test typically takes 30-45 minutes to complete. ________________________________________________________________________________  Diet for Gastroesophageal Reflux Disease, Adult Reflux (acid reflux) is when acid from your stomach flows up into the esophagus. When acid comes in contact with the esophagus, the acid causes irritation and soreness (inflammation) in the esophagus. When reflux happens often or so severely that it causes damage to the esophagus, it is called gastroesophageal reflux disease (GERD). Nutrition therapy can help ease the discomfort of GERD. FOODS OR DRINKS TO AVOID OR LIMIT  Smoking or chewing tobacco. Nicotine is one of the most potent stimulants to acid production in the gastrointestinal tract.  Caffeinated and decaffeinated coffee and black tea.  Regular or low-calorie carbonated beverages or energy drinks (caffeine-free carbonated beverages are allowed).   Strong spices, such as black pepper, white pepper, red pepper, cayenne, curry powder, and chili powder.  Peppermint or spearmint.  Chocolate.  High-fat foods, including meats and fried foods. Extra added fats including oils, butter, salad dressings, and nuts. Limit these to less than 8 tsp per day.  Fruits and vegetables if they are not tolerated, such as citrus fruits or tomatoes.  Alcohol.  Any food that seems to aggravate your condition. If you have questions regarding your diet, call your caregiver or a registered dietitian. OTHER THINGS THAT MAY HELP GERD INCLUDE:   Eating your meals slowly, in a relaxed setting.  Eating 5 to 6 small meals per day instead of 3 large meals.  Eliminating food for a period of time if it causes distress.  Not lying down until 3 hours after eating a meal.  Keeping the  head of your bed raised 6 to 9 inches (15 to 23 cm) by using a foam wedge or blocks under the legs of the bed. Lying flat may make symptoms worse.  Being physically active. Weight loss may be helpful in reducing reflux in overweight or obese adults.  Wear loose fitting clothing EXAMPLE MEAL PLAN This meal plan is approximately 2,000 calories based on https://www.bernard.org/ meal planning guidelines. Breakfast   cup cooked oatmeal.  1 cup strawberries.  1 cup low-fat milk.  1 oz almonds. Snack  1 cup cucumber slices.  6 oz yogurt (made from low-fat or fat-free milk). Lunch  2 slice whole-wheat bread.  2 oz sliced [place].  2 tsp mayonnaise.  1 cup blueberries.  1 cup snap peas. Snack  6 whole-wheat crackers.  1 oz string cheese. Dinner   cup brown rice.  1 cup mixed veggies.  1 tsp olive oil.  3 oz grilled fish. Document Released: 07/25/2005 Document Revised: 10/17/2011 Document Reviewed: 06/10/2011 [facility] Patient Information 2013 [place], [place].

## 2012-10-04 NOTE — Progress Notes (Signed)
This is a very pleasant 57 year old Caucasian female with Raynaud's phenomenon and associated severe GERD.  She recently had to increased her Protonix from once to twice a day with good response in terms of her burning substernal chest pain and regurgitation, but she continues with some intermittent solid food dysphagia.  She is up-to-date on her endoscopy and colonoscopies.  She also has gluten intolerance and is on a strict gluten-free diet,  But also has associated chronic constipation requiring Amitiza 24 mcg twice a day.  There is no anorexia, weight loss, hepatobiliary, or systemic complaints otherwise.  She does not give a diagnoses of scleroderma or lupus.  No history of melena, hematochezia, or colon polyps.  She also seemed to have lactose intolerance, but denies use of sorbitol or fructose.  Patient's history is remarkable for recurrent iron deficiency and also vitamin D deficiency despite oral replacement therapy.  This may be related to her celiac disease and chronic malabsorption.  Current Medications, Allergies, Past Medical History, Past Surgical History, Family History and Social History were reviewed in [employer] record.  ROS: All systems were reviewed and are negative unless otherwise stated in the HPI.          Physical Exam: Healthy-appearing patient in no distress.  There no stigmata of chronic liver disease.  Blood pressure 128/80, pulse 72, and weight 168 with a BMI of 27.16.. examination the neck shows a small nontender freely mobile right anterior noted.  There no lymphadenopathy or thyromegaly.  Chest is clear she is in a regular rhythm without murmurs gallops or rubs.  I cannot appreciate hepatosplenomegaly, abdominal masses, tenderness, or distention.  Bowel sounds are normal.  Peripheral extremities are unremarkable and mental status is normal.    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   She requires twice a day PPI therapy to control her GERD.  We will check  high resolution esophageal manometry for clarification.  She is to continue a gluten-free diet, and have repeated her labs today including routine labs, celiac serologies, vitamin D, thiamine, zinc, and magnesium levels.  She is continue other medications for chronic constipation as tolerated.  I have suggested VSl #3 as a probiotic trial. Encounter Diagnoses  Name Primary?  Marland Kitchen Anemia Yes  . GERD (gastroesophageal reflux disease)

## 2012-10-05 LAB — CELIAC PANEL 10
Tissue Transglut Ab: 5.8 U/mL (ref <20)
Tissue Transglutaminase Ab, IgA: 8.3 U/mL (ref <20)

## 2012-10-05 LAB — VITAMIN D 25 HYDROXY (VIT D DEFICIENCY, FRACTURES): Vit D, 25-Hydroxy: 21 ng/mL — ABNORMAL LOW (ref 30–89)

## 2012-10-07 LAB — VITAMIN B1: Vitamin B1 (Thiamine): 12 nmol/L (ref 8–30)

## 2012-10-09 LAB — CAROTENE, SERUM: Carotene, Total-Serum: 18 ug/dL (ref 6–77)

## 2012-10-12 ENCOUNTER — Other Ambulatory Visit: Payer: BC Managed Care – PPO

## 2012-10-13 ENCOUNTER — Ambulatory Visit
Admission: RE | Admit: 2012-10-13 | Discharge: 2012-10-13 | Disposition: A | Discharge status: Home / Self Care | Payer: BC Managed Care – PPO | Source: Ambulatory Visit | Attending: Orthopedic Surgery | Admitting: Orthopedic Surgery

## 2012-10-13 DIAGNOSIS — M542 Cervicalgia: Secondary | ICD-10-CM

## 2012-10-16 ENCOUNTER — Telehealth: Payer: Self-pay | Admitting: Gastroenterology

## 2012-10-16 DIAGNOSIS — K219 Gastro-esophageal reflux disease without esophagitis: Secondary | ICD-10-CM

## 2012-10-16 DIAGNOSIS — K589 Irritable bowel syndrome without diarrhea: Secondary | ICD-10-CM

## 2012-10-16 MED ORDER — LUBIPROSTONE 24 MCG PO CAPS: 24.0000 ug | ORAL_CAPSULE | Freq: Two times a day (BID) | ORAL | Status: DC

## 2012-10-16 NOTE — Telephone Encounter (Signed)
Needs tandem daily and Caltrate 600 with vit D bid      Informed pt of her results and the need for tandem daily and caltrate 600 w/ Vit D bid. Pt stated understanding and requested a refill on Amitiza.

## 2012-10-17 ENCOUNTER — Telehealth: Payer: Self-pay | Admitting: *Deleted

## 2012-10-17 NOTE — Telephone Encounter (Signed)
Ok to use?

## 2012-10-17 NOTE — Telephone Encounter (Signed)
Pt reports she has an Esophageal Manometry on Monday and she gets very anxious and claustrophobic with test. She has saved 2- 5mg  valium and she has Klonopin; she wants to know if she can take on of the drugs prior to the test. I thought either would interfere with the esophagus, so I call [facility] and they said it was your call. Can pt take either Klonpin or Valium prior to her EM? Thanks.

## 2012-10-17 NOTE — Telephone Encounter (Signed)
Informed pt Dr Jarold Motto stated she may take either of these drugs; pt stated understanding.

## 2012-10-22 ENCOUNTER — Encounter (HOSPITAL_COMMUNITY): Payer: Self-pay | Admitting: *Deleted

## 2012-10-22 ENCOUNTER — Ambulatory Visit (HOSPITAL_COMMUNITY)
Admission: RE | Admit: 2012-10-22 | Discharge: 2012-10-22 | Disposition: A | Discharge status: Home / Self Care | Payer: BC Managed Care – PPO | Source: Ambulatory Visit | Attending: Gastroenterology | Admitting: Gastroenterology

## 2012-10-22 ENCOUNTER — Encounter (HOSPITAL_COMMUNITY)
Admission: RE | Disposition: A | Discharge status: Home / Self Care | Payer: Self-pay | Source: Ambulatory Visit | Attending: Gastroenterology

## 2012-10-22 DIAGNOSIS — R12 Heartburn: Secondary | ICD-10-CM | POA: Insufficient documentation

## 2012-10-22 DIAGNOSIS — R1110 Vomiting, unspecified: Secondary | ICD-10-CM | POA: Insufficient documentation

## 2012-10-22 HISTORY — PX: ESOPHAGEAL MANOMETRY: SHX5429

## 2012-10-22 SURGERY — MANOMETRY, ESOPHAGUS

## 2012-10-22 MED ORDER — LIDOCAINE VISCOUS 2 % MT SOLN
OROMUCOSAL | Status: AC
  Filled 2012-10-22: qty 15

## 2012-10-23 ENCOUNTER — Encounter (HOSPITAL_COMMUNITY): Payer: Self-pay | Admitting: Gastroenterology

## 2012-10-24 ENCOUNTER — Telehealth: Payer: Self-pay | Admitting: *Deleted

## 2012-10-24 NOTE — Telephone Encounter (Signed)
Informed pt her EM was unremarkable per Dr Jarold Motto. He would like to discuss the results with her and pt is scheduled for 11/15/12. Pt stated understanding.

## 2012-11-15 ENCOUNTER — Ambulatory Visit (INDEPENDENT_AMBULATORY_CARE_PROVIDER_SITE_OTHER): Payer: BC Managed Care – PPO | Admitting: Gastroenterology

## 2012-11-15 ENCOUNTER — Encounter: Payer: Self-pay | Admitting: Gastroenterology

## 2012-11-15 VITALS — BP 126/80 | HR 75 | Ht 66.0 in | Wt 166.8 lb

## 2012-11-15 DIAGNOSIS — E739 Lactose intolerance, unspecified: Secondary | ICD-10-CM

## 2012-11-15 DIAGNOSIS — K589 Irritable bowel syndrome without diarrhea: Secondary | ICD-10-CM

## 2012-11-15 DIAGNOSIS — K7689 Other specified diseases of liver: Secondary | ICD-10-CM

## 2012-11-15 MED ORDER — ALIGN PO CAPS: 1.0000 | ORAL_CAPSULE | Freq: Every day | ORAL | Status: DC

## 2012-11-15 NOTE — Patient Instructions (Addendum)
You have been scheduled for an abdominal ultrasound at [facility] (1st floor of hospital) on 11-16-12 at 8:30 AM. Please arrive 15 minutes prior to your appointment for registration. Make certain not to have anything to eat or drink 6 hours prior to your appointment. Should you need to reschedule your appointment, please contact radiology at [phone]. This test typically takes about 30 minutes to perform.  We have given you samples of Align. This puts good bacteria back into your colon. You should take 1 capsule by mouth once daily. If this works well for you, it can be purchased over the counter.  Please purchase Lactaid over the counter and take one tablet by mouth three times daily before meals.  Lactose diet below. _________________________________________________________________________________________________________________  Lactose Intolerance, Adult Lactose intolerance is when the body is not able to digest lactose, a sugar found in milk and milk products. Lactose intolerance is caused by your body not producing enough of the enzyme lactase. When there is not enough lactase to digest the amount of lactose consumed, discomfort may be felt. Lactose intolerance is not a milk allergy. For most people, lactase deficiency is a condition that develops naturally over time. After about the age of 2, the body begins to produce less lactase. But many people may not experience symptoms until they are much older. CAUSES Things that can cause you to be lactose intolerant include:  Aging.  Being born without the ability to make lactase.  Certain digestive diseases.  Injuries to the small intestine. SYMPTOMS   Feeling sick to your stomach (nauseous).  Diarrhea.  Cramps.  Bloating.  Gas. Symptoms usually show up a half hour or 2 hours after eating or drinking products containing lactose. TREATMENT  No treatment can improve the body's ability to produce lactase. However,  symptoms can be controlled through diet. A medicine may be given to you to take when you consume lactose-containing foods or drinks. The medicine contains the lactase enzyme, which help the body digest lactose better. HOME CARE INSTRUCTIONS  Eat or drink dairy products as told by your caregiver or dietician.  Take all medicine as directed by your caregiver.  Find lactose-free or lactose-reduced products at your local grocery store.  Talk to your caregiver or dietician to decide if you need any dietary supplements. The following is the amount of calcium needed from the diet:  19 to 50 years: 1000 mg  Over 50 years: 1200 mg Calcium and Lactose in Common Foods Non-Dairy Products / Calcium Content (mg)  Calcium-fortified orange juice, 1 cup / 308 to 344 mg  Sardines, with edible bones, 3 oz / 270 mg  Salmon, canned, with edible bones, 3 oz / 205 mg  Soymilk, fortified, 1 cup / 200 mg  Broccoli (raw), 1 cup / 90 mg  Orange, 1 medium / 50 mg  Pinto beans,  cup / 40 mg  Tuna, canned, 3 oz / 10 mg  Lettuce greens,  cup / 10 mg Dairy Products / Calcium Content (mg) / Lactose Content (g)  Yogurt, plain, low-fat, 1 cup / 415 mg / 5 g  Milk, reduced fat, 1 cup / 295 mg / 11 g  Swiss cheese, 1 oz / 270 mg / 1 g  Ice cream,  cup / 85 mg / 6 g  Cottage cheese,  cup / 75 mg / 2 to 3 g SEEK MEDICAL CARE IF: You have no relief from your symptoms. Document Released: 07/25/2005 Document Revised: 10/17/2011 Document Reviewed: 10/22/2010 [employer]  Patient Information 2013 [place], [place].  ______________________________________________________________________________________________________________                                               We are excited to introduce MyChart, a new best-in-class service that provides you online access to important information in your electronic medical record. We want to make it easier for you to view your health information - all in one  secure location - when and where you need it. We expect MyChart will enhance the quality of care and service we provide.  When you register for MyChart, you can:    View your test results.    Request appointments and receive appointment reminders via email.    Request medication renewals.    View your medical history, allergies, medications and immunizations.    Communicate with your physician's office through a password-protected site.    Conveniently print information such as your medication lists.  To find out if MyChart is right for you, please talk to a member of our clinical staff today. We will gladly answer your questions about this free health and wellness tool.  If you are age 59 or older and want a member of your family to have access to your record, you must provide written consent by completing a proxy form available at our office. Please speak to our clinical staff about guidelines regarding accounts for patients younger than age 60.  As you activate your MyChart account and need any technical assistance, please call the MyChart technical support line at [phone]) or email your question to [email or web address]. If you email your question(s), please include your name, a return phone number and the best time to reach you.  If you have non-urgent health-related questions, you can send a message to our office through MyChart at [email or web address]. If you have a medical emergency, call 911.  Thank you for using MyChart as your new health and wellness resource!   MyChart licensed from [employer],  [id number]. Patents Pending.

## 2012-11-15 NOTE — Progress Notes (Signed)
History of Present Illness: This is a 57 year old Caucasian female with celiac sensitivity and rather severe lactose intolerance.  She's had chronic GERD and recently underwent esophageal manometry which was entirely normal.  He has chronic constipation requiring Amitiza 24 mcg twice a day.  A lot of her problems are felt to be functional in nature, and she is on Klonopin, Flexeril, and Lexapro.  She also has a benign right hepatic cyst is due for followup ultrasound exam.    Current Medications, Allergies, Past Medical History, Past Surgical History, Family History and Social History were reviewed in [employer] record.  ROS: All systems were reviewed and are negative unless otherwise stated in the HPI.         Assessment and plan: We have reviewed lactose intolerance, and I placed her on a lactose-free diet with 2 Lactaid tablets 3 times a day with meals.  Also we will try Align probiotic therapy for a few weeks for her gas and bloating.  I reviewed her manometric findings with her today, see no evidence of an esophageal motility disorder.  Followup liver ultrasound ordered.

## 2012-11-16 ENCOUNTER — Ambulatory Visit (HOSPITAL_COMMUNITY)
Admission: RE | Admit: 2012-11-16 | Discharge: 2012-11-16 | Disposition: A | Discharge status: Home / Self Care | Payer: BC Managed Care – PPO | Source: Ambulatory Visit | Attending: Gastroenterology | Admitting: Gastroenterology

## 2012-11-16 DIAGNOSIS — K7689 Other specified diseases of liver: Secondary | ICD-10-CM | POA: Insufficient documentation

## 2013-02-20 ENCOUNTER — Telehealth: Payer: Self-pay | Admitting: *Deleted

## 2013-02-20 ENCOUNTER — Telehealth: Payer: Self-pay | Admitting: Gastroenterology

## 2013-02-20 MED ORDER — CLONAZEPAM 0.5 MG PO TABS: 0.5000 mg | ORAL_TABLET | Freq: Two times a day (BID) | ORAL | Status: DC | PRN

## 2013-02-20 NOTE — Telephone Encounter (Signed)
Refilled medication and faxed

## 2013-02-20 NOTE — Telephone Encounter (Signed)
OK to refill

## 2013-03-15 ENCOUNTER — Telehealth: Payer: Self-pay | Admitting: Gastroenterology

## 2013-03-15 MED ORDER — PANTOPRAZOLE SODIUM 40 MG PO TBEC: 40.0000 mg | DELAYED_RELEASE_TABLET | Freq: Two times a day (BID) | ORAL | Status: DC

## 2013-03-15 NOTE — Telephone Encounter (Signed)
[facility] to cancel the Protonix RX that was sent for 60 tablets, should have been 180 tablets for 90 days Per Alexia Freestone has not come across yet but she can put a hold and note in system to cancel Protonix 60 tablets and make a note to accept the RX for Protonix for 180 tablets

## 2013-04-03 ENCOUNTER — Ambulatory Visit (INDEPENDENT_AMBULATORY_CARE_PROVIDER_SITE_OTHER): Payer: BC Managed Care – PPO | Admitting: Endocrinology

## 2013-04-03 ENCOUNTER — Encounter: Payer: Self-pay | Admitting: Endocrinology

## 2013-04-03 ENCOUNTER — Ambulatory Visit (INDEPENDENT_AMBULATORY_CARE_PROVIDER_SITE_OTHER): Payer: BC Managed Care – PPO | Admitting: Sports Medicine

## 2013-04-03 VITALS — BP 120/70 | Ht 66.0 in | Wt 167.0 lb

## 2013-04-03 VITALS — BP 122/70 | HR 71 | Ht 66.0 in | Wt 167.0 lb

## 2013-04-03 DIAGNOSIS — M25552 Pain in left hip: Secondary | ICD-10-CM

## 2013-04-03 DIAGNOSIS — F329 Major depressive disorder, single episode, unspecified: Secondary | ICD-10-CM

## 2013-04-03 DIAGNOSIS — F3289 Other specified depressive episodes: Secondary | ICD-10-CM

## 2013-04-03 DIAGNOSIS — M25559 Pain in unspecified hip: Secondary | ICD-10-CM

## 2013-04-03 DIAGNOSIS — M76899 Other specified enthesopathies of unspecified lower limb, excluding foot: Secondary | ICD-10-CM

## 2013-04-03 DIAGNOSIS — L659 Nonscarring hair loss, unspecified: Secondary | ICD-10-CM

## 2013-04-03 DIAGNOSIS — M7062 Trochanteric bursitis, left hip: Secondary | ICD-10-CM

## 2013-04-03 MED ORDER — MELOXICAM 15 MG PO TABS: 15.0000 mg | ORAL_TABLET | Freq: Every day | ORAL | Status: DC | PRN

## 2013-04-03 MED ORDER — CYCLOBENZAPRINE HCL 10 MG PO TABS: 10.0000 mg | ORAL_TABLET | Freq: Every evening | ORAL | Status: DC | PRN

## 2013-04-03 MED ORDER — TRAMADOL HCL 50 MG PO TABS
50.0000 mg | ORAL_TABLET | Freq: Two times a day (BID) | ORAL | Status: DC | PRN
Start: 2013-04-03 — End: 2014-04-16

## 2013-04-03 NOTE — Progress Notes (Signed)
  Subjective:    Patient ID: [name], female    DOB: January 28, 1956, 57 y.o.   MRN: [id number]  HPI chief complaint: Left hip pain  Very pleasant 57 year old female patient who has been a patient of mine at [facility] for years comes in today with chronic left hip pain. Her pain is all along the lateral aspect of her hip. She has been treated in the past for greater trochanteric bursitis with cortisone injections and physical therapy. Cortisone injections have been helpful and last injection was several months ago. She denies any injury. No deep-seated groin pain. Pain is worse with sleeping on her left hip at night. She takes an occasional Mobic or tramadol as needed for pain as well as an occasional Flexeril for muscle spasm. She is needing refills on all of these medicines. She is getting some mild pain in the right hip as well but not as severe as on the left. She notices that if she wears her [place] flip-flops that her pain improves. She is allowed to wear flip-flops at her job but for some reason her Sanuks are not approved.  Past medical history is reviewed. Current medications are reviewed She is allergic to aspirin She is a former smoker having quit 26 years ago, drinks alcohol on occasion    Review of Systems     Objective:   Physical Exam Well-developed, well-nourished. No acute distress. Awake alert and oriented x3. Vital signs are reviewed  Left hip: Smooth painless hip range of motion with a negative log roll. There is discrete tenderness to palpation over the left greater trochanteric bursa. No other bony or soft tissue tenderness to palpation. Neurovascularly intact distally. Walking without significant limp.       Assessment & Plan:  1. Left hip pain secondary to greater trochanteric bursitis  Patient's left greater trochanteric bursa was injected with a 2:6 cortisone/Xylocaine injection. Patient tolerated this without difficulty. I will refill her Mobic,  tramadol, and Flexeril. She has a good understanding of her home exercises including IT band stretching and hip abductor strengthening. I've given her a note for her employer asking that she be allowed to wear her Sanuk flip-flops at work. Followup PRN.

## 2013-04-03 NOTE — Progress Notes (Signed)
Subjective:    Patient ID: [name], female    DOB: 02/12/1956, 57 y.o.   MRN: [id number]  HPI Pt states 1 year of moderate hair loss throughout the head, and assoc weight gain.   Past Medical History  Diagnosis Date  . Bladder disease   . Syncope   . Hepatic cyst   . Chronic interstitial cystitis   . Depressive disorder, not elsewhere classified   . Esophageal reflux   . Unspecified constipation   . Irritable bowel syndrome   . Anemia     Past Surgical History  Procedure Laterality Date  . Oophorectomy    . Cystectomy    . Milk gland removal    . Vaginal hysterectomy    . Laparoscopy    . Esophageal manometry N/A 10/22/2012    Procedure: ESOPHAGEAL MANOMETRY (EM);  Surgeon: Mardella Layman, MD;  Location: [facility];  Service: Endoscopy;  Laterality: N/A;    History   Social History  . Marital Status: Married    Spouse Name: N/A    Number of Children: 3  . Years of Education: N/A   Occupational History  . clerk of social services    Social History Main Topics  . Smoking status: Former Smoker    Types: Cigarettes    Quit date: 08/09/1987  . Smokeless tobacco: Never Used  . Alcohol Use: Yes     Comment: Occas.  . Drug Use: Not on file  . Sexual Activity: Not on file   Other Topics Concern  . Not on file   Social History Narrative  . No narrative on file    Current Outpatient Prescriptions on File Prior to Visit  Medication Sig Dispense Refill  . Acetaminophen (TYLENOL PO) Take 1 capsule by mouth as needed.       . bifidobacterium infantis (ALIGN) capsule Take 1 capsule by mouth daily.  14 capsule  0  . clonazePAM (KLONOPIN) 0.5 MG tablet Take 1 tablet (0.5 mg total) by mouth 2 (two) times daily as needed.  180 tablet  2  . cyclobenzaprine (FLEXERIL) 10 MG tablet Take 1 tablet (10 mg total) by mouth daily as needed for muscle spasms.  20 tablet  0  . escitalopram (LEXAPRO) 20 MG tablet Take 20 mg by mouth daily.        . folic acid (FOLVITE) 1  MG tablet TAKE 1 TABLET BY MOUTH DAILY  90 tablet  2  . hyoscyamine (LEVSIN, ANASPAZ) 0.125 MG tablet Take 1 tablet (0.125 mg total) by mouth as needed.  180 tablet  3  . lubiprostone (AMITIZA) 24 MCG capsule Take 1 capsule (24 mcg total) by mouth 2 (two) times daily with a meal.  60 capsule  11  . pantoprazole (PROTONIX) 40 MG tablet Take 1 tablet (40 mg total) by mouth 2 (two) times daily.  180 tablet  4   No current facility-administered medications on file prior to visit.    Allergies  Allergen Reactions  . Aspirin     REACTION: NAUSEA    Family History  Problem Relation Age of Onset  . Heart attack Mother   . Other Mother     bladder cancer  . Pancreatic cancer Paternal Grandfather   . Diabetes Paternal Grandmother   . Colon cancer Neg Hx   no thyroid probs  BP 122/70  Pulse 71  Ht 5\' 6"  (1.676 m)  Wt 167 lb (75.751 kg)  BMI 26.97 kg/m2  SpO2 94%  Review of  Systems She denies sob, fever, difficulty with concentration, numbness, blurry vision, myalgias, rhinorrhea, and syncope.   She has fatigue, muscle cramps, constipation, headache, dry skin, easy bruising, and depression.      Objective:   Physical Exam VS: see vs page GEN: no distress HEAD: head: no deformity.  Normal hair distribution. eyes: no periorbital swelling, no proptosis external nose and ears are normal mouth: no lesion seen NECK: supple, thyroid is not enlarged CHEST WALL: no deformity LUNGS:  Clear to auscultation.   CV: reg rate and rhythm, no murmur ABD: abdomen is soft, nontender.  no hepatosplenomegaly.  not distended.  no hernia MUSCULOSKELETAL: muscle bulk and strength are grossly normal.  no obvious joint swelling.  gait is normal and steady EXTEMITIES: no deformity.  no ulcer on the feet.  feet are of normal color and temp.  no edema PULSES: dorsalis pedis intact bilat.  no carotid bruit NEURO:  cn 2-12 grossly intact.   readily moves all 4's.  sensation is intact to touch on the feet.   No tremor SKIN:  Normal texture and temperature.  Not diaphoretic.  No rash or suspicious lesion is visible.   NODES:  None palpable at the neck PSYCH: alert, oriented x3.  Does not appear anxious nor depressed.  Lab Results  Component Value Date   TSH 1.45 04/03/2013      Assessment & Plan:  Hair loss, not thyroid-related Weight gain, not thyroid-related Fatigue, not thyroid-related Celiac disease.  This places her at risk for autoimmune thyroid disease in the future.

## 2013-04-03 NOTE — Patient Instructions (Addendum)
blood tests are being requested for you today.  We'll contact you with results.  

## 2013-04-04 LAB — T3, FREE: T3, Free: 2.8 pg/mL (ref 2.3–4.2)

## 2013-04-04 LAB — T4, FREE: Free T4: 0.93 ng/dL (ref 0.60–1.60)

## 2013-06-13 ENCOUNTER — Other Ambulatory Visit: Payer: Self-pay

## 2013-08-20 ENCOUNTER — Encounter: Payer: Self-pay | Admitting: Gastroenterology

## 2013-08-20 ENCOUNTER — Ambulatory Visit (INDEPENDENT_AMBULATORY_CARE_PROVIDER_SITE_OTHER): Payer: BC Managed Care – PPO | Admitting: Gastroenterology

## 2013-08-20 VITALS — BP 120/78 | HR 72 | Ht 66.0 in | Wt 170.0 lb

## 2013-08-20 DIAGNOSIS — R110 Nausea: Secondary | ICD-10-CM

## 2013-08-20 DIAGNOSIS — K9041 Non-celiac gluten sensitivity: Secondary | ICD-10-CM

## 2013-08-20 DIAGNOSIS — K219 Gastro-esophageal reflux disease without esophagitis: Secondary | ICD-10-CM

## 2013-08-20 DIAGNOSIS — K900 Celiac disease: Secondary | ICD-10-CM

## 2013-08-20 DIAGNOSIS — K589 Irritable bowel syndrome without diarrhea: Secondary | ICD-10-CM

## 2013-08-20 MED ORDER — LINACLOTIDE 145 MCG PO CAPS: 145.0000 ug | ORAL_CAPSULE | Freq: Every day | ORAL | Status: DC

## 2013-08-20 NOTE — Progress Notes (Signed)
This is a 58 year old Caucasian female with IBS and some lactose sensitivity and gluten sensitivity.  She complains of chronic nausea, constipation, gas and bloating.  She is currently on Amitiza 24 mcg twice a day which seems to be to harsh for her bowel habits.  There is no history of rectal bleeding, melena, upper GI or hepatobiliary complaints.  She is on daily pantoprazole.  She's had no anorexia, weight loss, or systemic complaints.  She does use periodic probiotics.  Current Medications, Allergies, Past Medical History, Past Surgical History, Family History and Social History were reviewed in [facility] record.  ROS: All systems were reviewed and are negative unless otherwise stated in the HPI.          Physical Exam: Blood pressure 120/78, pulse 72 and regular and weight 170 with a BMI of 27.45.  I cannot appreciate stigmata of chronic liver disease.  Her abdomen shows no organomegaly, masses or tenderness.  Bowel sounds are normal.  Rectal exam is deferred.  Mental status is normal.    Assessment and Plan: IBS with gluten sensitivity.  I placed her on a FOD-MAP diet and will change her from Amitiza to Linzess  45 mcg a day with liberal by mouth fluids.  Also prescribed  VSL#3 probiotics with office followup in several weeks' time.  She is up-to-date on her endoscopy and colonoscopy exams.  Previous small bowel biopsy was negative.  Review of labs shows no specific abnormalities.  I will see her back in several weeks' time for follow .  I've advised her to continue daily Prilosec for her GERD therapy.

## 2013-08-20 NOTE — Patient Instructions (Addendum)
Please make a follow up appointment with Dr. Sharlett Iles in two weeks.  Stop Amitiza and try Linzess   We have given you samples of the following medication to take: Linzess 145 mcg, please take one capsule by mouth on an empty stomach

## 2013-09-05 ENCOUNTER — Encounter: Payer: Self-pay | Admitting: Sports Medicine

## 2013-09-05 ENCOUNTER — Encounter: Payer: Self-pay | Admitting: Gastroenterology

## 2013-09-05 ENCOUNTER — Ambulatory Visit (INDEPENDENT_AMBULATORY_CARE_PROVIDER_SITE_OTHER): Payer: BC Managed Care – PPO | Admitting: Sports Medicine

## 2013-09-05 ENCOUNTER — Other Ambulatory Visit: Payer: Self-pay | Admitting: *Deleted

## 2013-09-05 ENCOUNTER — Ambulatory Visit (INDEPENDENT_AMBULATORY_CARE_PROVIDER_SITE_OTHER): Payer: BC Managed Care – PPO | Admitting: Gastroenterology

## 2013-09-05 VITALS — BP 120/88 | HR 80 | Ht 66.0 in | Wt 168.4 lb

## 2013-09-05 VITALS — BP 127/85 | HR 68 | Ht 66.0 in | Wt 168.0 lb

## 2013-09-05 DIAGNOSIS — R140 Abdominal distension (gaseous): Secondary | ICD-10-CM

## 2013-09-05 DIAGNOSIS — M25552 Pain in left hip: Secondary | ICD-10-CM

## 2013-09-05 DIAGNOSIS — M7061 Trochanteric bursitis, right hip: Secondary | ICD-10-CM

## 2013-09-05 DIAGNOSIS — K219 Gastro-esophageal reflux disease without esophagitis: Secondary | ICD-10-CM

## 2013-09-05 DIAGNOSIS — F411 Generalized anxiety disorder: Secondary | ICD-10-CM

## 2013-09-05 DIAGNOSIS — M7062 Trochanteric bursitis, left hip: Principal | ICD-10-CM

## 2013-09-05 DIAGNOSIS — R143 Flatulence: Secondary | ICD-10-CM

## 2013-09-05 DIAGNOSIS — K5900 Constipation, unspecified: Secondary | ICD-10-CM

## 2013-09-05 DIAGNOSIS — M25559 Pain in unspecified hip: Secondary | ICD-10-CM

## 2013-09-05 DIAGNOSIS — M25551 Pain in right hip: Secondary | ICD-10-CM

## 2013-09-05 DIAGNOSIS — R142 Eructation: Secondary | ICD-10-CM

## 2013-09-05 DIAGNOSIS — R141 Gas pain: Secondary | ICD-10-CM

## 2013-09-05 DIAGNOSIS — M76899 Other specified enthesopathies of unspecified lower limb, excluding foot: Secondary | ICD-10-CM

## 2013-09-05 DIAGNOSIS — K589 Irritable bowel syndrome without diarrhea: Secondary | ICD-10-CM

## 2013-09-05 MED ORDER — METHYLPREDNISOLONE ACETATE 80 MG/ML IJ SUSP
80.0000 mg | Freq: Once | INTRAMUSCULAR | Status: AC
  Administered 2013-09-05: 80 mg via INTRA_ARTICULAR

## 2013-09-05 MED ORDER — POLYETHYLENE GLYCOL 3350 17 GM/SCOOP PO POWD: 1.0000 | Freq: Every day | ORAL | Status: DC

## 2013-09-05 MED ORDER — LUBIPROSTONE 24 MCG PO CAPS: 24.0000 ug | ORAL_CAPSULE | Freq: Two times a day (BID) | ORAL | Status: DC

## 2013-09-05 MED ORDER — CLONAZEPAM 0.5 MG PO TABS: 0.5000 mg | ORAL_TABLET | Freq: Two times a day (BID) | ORAL | Status: AC | PRN

## 2013-09-05 MED ORDER — CYCLOBENZAPRINE HCL 10 MG PO TABS: 10.0000 mg | ORAL_TABLET | Freq: Every evening | ORAL | Status: DC | PRN

## 2013-09-05 MED ORDER — CLONAZEPAM 0.5 MG PO TABS: 0.5000 mg | ORAL_TABLET | Freq: Two times a day (BID) | ORAL | Status: DC | PRN

## 2013-09-05 NOTE — Telephone Encounter (Signed)
[facility], needs another rx sent because they do not accept stamp signature Had Dr. Sharlett Iles signed & then faxed it

## 2013-09-05 NOTE — Progress Notes (Signed)
This is a 58 year old Caucasian female with chronic constipation predominant IBS with associated gas and bloating.  She also has gluten intolerance and intolerance of nonabsorbable carbohydrates.  He recently attempted to try Linzess 145 mcg in place of Amitiza, and this was unsuccessful in terms of severe nausea, cramping, and lack of bowel response.  She denies nausea vomiting, hepatobiliary complaints, and is on chronic pantoprazole for acid reflux.  She also has associated stress syndrome and uses Klonopin 0.5 mg one to 2 times a day as needed.  She also uses when necessary sublingual Levsin, and is currently on a FOD-MAP diet for IBS.  She denies melena, hematochezia, anorexia or weight loss or any systemic complaints.  Recent celiac antibodies studies were negative.  Small bowel biopsy was negative endoscopy which was otherwise normal in March of 2012.  Colonoscopy that time also was unremarkable.  Esophageal manometry in March of 2014 was unremarkable without evidence of any significant dysmotility.  Current Medications, Allergies, Past Medical History, Past Surgical History, Family History and Social History were reviewed in [facility] record.  ROS: All systems were reviewed and are negative unless otherwise stated in the HPI.          Physical Exam: Blood pressure 120/88, pulse 80 and regular and weight 168 with a BMI of 27.19.  I cannot appreciate stigmata of chronic liver disease.  Chest is clear she appear to be in a regular rhythm without murmurs gallops or rubs.  There is no hepatosplenomegaly, abdominal masses, tenderness, or significant distention.  Bowel sounds are normal.  Peripheral extremities are unremarkable, and mental status is normal.    Assessment and Plan: #1.  Chronic GERD doing well on daily PPI therapy.  Standard antireflux maneuvers reviewed.  #2.  Chronic constipation predominant IBS doing better on a Fod-MAP diet which she is to continue.  We  will go back to Amitiza 24 mcg once a day with when necessary MiraLax at bedtime.  #3 chronic anxiety and low-dose when necessary Klonopin.  I've scheduled this patient followup with Dr. West Carbo in one month's time for review and followup.  She seems to be doing fairly well at this time with her multiple GI issues.  I renewed all her medications as listed and reviewed.  She been informed stop Linzess completely.

## 2013-09-05 NOTE — Patient Instructions (Addendum)
We have [facility] the following medications to [facility] Amitiza 24 mcg, please take one capsule by mouth twice daily with foods Miralax, please take eight ounces at bedtime mix it in a liquid(water, juice, coffee or tea)  Klonopin 0.5 mg, please take one tablet by mouth twice daily as needed   Follow up with Dr. Hilarie Fredrickson in one month

## 2013-09-05 NOTE — Progress Notes (Signed)
   Subjective:    Patient ID: [name], female    DOB: February 14, 1956, 58 y.o.   MRN: [id number]  HPI chief complaint: Bilateral hip pain  Patient comes in today complaining of 1 month of bilateral lateral hip pain. She has a history of previous greater trochanteric bursitis. She was last in the office in August and an injection into the left greater trochanteric bursa provided her with significant symptom relief. Her pain began one month ago after she started a new exercise program. She denies any groin pain. She takes meloxicam, tramadol, and Flexeril as needed. No numbness and tingling.    Review of Systems     Objective:   Physical Exam Well-developed, well-nourished. No acute distress. Awake alert and oriented x3. Vital signs are reviewed.  Examination of each hip shows tenderness to palpation directly over the greater trochanteric bursa. Smooth painless hip range of motion. Negative log roll. Neurovascular intact distally.       Assessment & Plan:  Bilateral hip pain secondary to greater trochanteric bursitis  More symptomatic right hip was injected today with cortisone. Patient will start physical therapy in [place] where they can utilize some iontophoresis on her left hip. Followup with me in 4 weeks. If left hip pain persists we can consider cortisone injection here at followup. Continue with meloxicam, tramadol, and Flexeril. Refill on Flexeril today. Call with questions or concerns prior to her followup visit.  Consent obtained and verified. Time-out conducted. Noted no overlying erythema, induration, or other signs of local infection. Skin prepped in a sterile fashion. Topical analgesic spray: Ethyl chloride. Joint: right greater troch bursa Needle: spinal needle Completed without difficulty. Meds: 6cc 1% xylocaine, 1cc (80mg ) depomedrol  Advised to call if fevers/chills, erythema, induration, drainage, or persistent bleeding.

## 2013-10-07 ENCOUNTER — Encounter: Payer: Self-pay | Admitting: Internal Medicine

## 2013-10-08 ENCOUNTER — Ambulatory Visit: Payer: BC Managed Care – PPO | Admitting: Internal Medicine

## 2013-10-16 ENCOUNTER — Ambulatory Visit: Payer: BC Managed Care – PPO | Admitting: Sports Medicine

## 2013-11-04 ENCOUNTER — Encounter: Payer: Self-pay | Admitting: Internal Medicine

## 2013-11-07 ENCOUNTER — Ambulatory Visit (INDEPENDENT_AMBULATORY_CARE_PROVIDER_SITE_OTHER): Payer: BC Managed Care – PPO | Admitting: Sports Medicine

## 2013-11-07 ENCOUNTER — Ambulatory Visit (INDEPENDENT_AMBULATORY_CARE_PROVIDER_SITE_OTHER): Payer: BC Managed Care – PPO | Admitting: Internal Medicine

## 2013-11-07 ENCOUNTER — Encounter: Payer: Self-pay | Admitting: Internal Medicine

## 2013-11-07 ENCOUNTER — Encounter: Payer: Self-pay | Admitting: Sports Medicine

## 2013-11-07 VITALS — BP 142/94 | HR 72 | Ht 66.0 in | Wt 170.2 lb

## 2013-11-07 VITALS — BP 157/89 | HR 72 | Ht 66.0 in | Wt 170.0 lb

## 2013-11-07 DIAGNOSIS — R140 Abdominal distension (gaseous): Secondary | ICD-10-CM

## 2013-11-07 DIAGNOSIS — M76899 Other specified enthesopathies of unspecified lower limb, excluding foot: Secondary | ICD-10-CM

## 2013-11-07 DIAGNOSIS — K219 Gastro-esophageal reflux disease without esophagitis: Secondary | ICD-10-CM

## 2013-11-07 DIAGNOSIS — K5900 Constipation, unspecified: Secondary | ICD-10-CM

## 2013-11-07 DIAGNOSIS — R143 Flatulence: Secondary | ICD-10-CM

## 2013-11-07 DIAGNOSIS — K589 Irritable bowel syndrome without diarrhea: Secondary | ICD-10-CM

## 2013-11-07 DIAGNOSIS — R141 Gas pain: Secondary | ICD-10-CM

## 2013-11-07 DIAGNOSIS — Z1211 Encounter for screening for malignant neoplasm of colon: Secondary | ICD-10-CM

## 2013-11-07 DIAGNOSIS — M7062 Trochanteric bursitis, left hip: Principal | ICD-10-CM

## 2013-11-07 DIAGNOSIS — R142 Eructation: Secondary | ICD-10-CM

## 2013-11-07 DIAGNOSIS — M7061 Trochanteric bursitis, right hip: Secondary | ICD-10-CM

## 2013-11-07 MED ORDER — ONDANSETRON 4 MG PO TBDP: 4.0000 mg | ORAL_TABLET | Freq: Three times a day (TID) | ORAL | Status: DC | PRN

## 2013-11-07 MED ORDER — LUBIPROSTONE 24 MCG PO CAPS: 24.0000 ug | ORAL_CAPSULE | Freq: Two times a day (BID) | ORAL | Status: DC

## 2013-11-07 MED ORDER — CYCLOBENZAPRINE HCL 10 MG PO TABS: 10.0000 mg | ORAL_TABLET | Freq: Every evening | ORAL | Status: DC | PRN

## 2013-11-07 NOTE — Progress Notes (Signed)
Subjective:    Patient ID: [name], female    DOB: 01-11-1956, 58 y.o.   MRN: [id number]  HPI [name] is a 58 yo female with past medical history of IBS with constipation predominance, GERD, history of hemorrhoids, history of interstitial cystitis who is seen in followup. She has been managed by Dr. Sharlett Iles, but due to his retirement is establishing care with me today. She is here alone today. She was last seen by Dr. Sharlett Iles in January 2015. Her biggest issue continues to be irritable bowel with constipation predominance. She tried Linzess but did not have a favorable response. She experienced abdominal bloating cramping and no benefit to her constipation. She was placed on lubiprostone and was using 24 mg at bedtime, because when she used it in the daytime she had nausea. She is now taking lubiprostone 48 mcg at bedtime and on the whole is getting a favorable response. She is having a bowel movement 3-4 days per week. Approximately once per month she wakes up in the middle of the night with nausea and urgent loose stool. She stopped MiraLax due to bloating and excessive gas. She reports she feels much better with MiraLax cessation. At times she does continue to have bloating and she uses ginger ale to help her belch. She is taking pantoprazole 40 mg once daily for reflux and further most part is well controlled her symptoms. She denies dysphagia or odynophagia. He pantoprazole as written twice daily but she rarely if ever uses it twice daily. She eats a strict gluten-free diet which he says helps her nausea and abdominal discomfort. She also tried the FODMAP diet which she found to be very helpful and made her feel better, but found it very difficult to adhere to.  Records reviewed. She's had negative celiac panel. A normal esophageal manometry in 2014. An EGD in 2012 with mild gastritis, H. pylori negative. Had colonoscopy in March 2012 which was normal except for hemorrhoids. Dr. Sharlett Iles  recommended repeating in 2022   Review of Systems As per history of present illness, otherwise negative  Current Medications, Allergies, Past Medical History, Past Surgical History, Family History and Social History were reviewed in [name].     Objective:   Physical Exam BP 142/94  Pulse 72  Ht 5\' 6"  (1.676 m)  Wt 170 lb 3.2 oz (77.202 kg)  BMI 27.48 kg/m2 Constitutional: Well-developed and well-nourished. No distress. HEENT: Normocephalic and atraumatic. Oropharynx is clear and moist. No oropharyngeal exudate. Conjunctivae are normal.  No scleral icterus. Neck: Neck supple. Trachea midline. Cardiovascular: Normal rate, regular rhythm and intact distal pulses. No M/R/G Pulmonary/chest: Effort normal and breath sounds normal. No wheezing, rales or rhonchi. Abdominal: Soft, nontender, nondistended. Bowel sounds active throughout.  Extremities: no clubbing, cyanosis, or edema Lymphadenopathy: No cervical adenopathy noted. Neurological: Alert and oriented to person place and time. Skin: Skin is warm and dry. No rashes noted. Psychiatric: Normal mood and affect. Behavior is normal.     Assessment & Plan:  58 yo female with past medical history of IBS with constipation predominance, GERD, history of hemorrhoids, history of interstitial cystitis who is seen in followup.  1.  IBS-C -- long-standing in nature and she is up-to-date with screening colonoscopy. She's tried multiple laxatives and had an unfavorable response to MiraLax and Linzess. She is using lubiprostone 48 mcg at bedtime, and is having a favorable response. Occasionally she has nausea which is likely a side effect of the medication.  For now we will continue lubiprostone at bedtime. I also recommended that she continue gluten-free diet as it seems to help her, this is felt to be gluten intolerance and not celiac disease. I also recommended that she use the FODMAP diet as much as possible  because she benefits from it. I will give her prescription for Zofran to be used as needed and as directed for periodic nausea.  2.  GERD -- no history of Barrett's esophagus. Continue pantoprazole 40 mg once daily.  3.  Gas and bloating -- felt to be secondary to IBS, but also dietary related. We discussed how ginger ale and other carbonated beverages can contribute to gas and bloating. If this becomes more of an issue we could try empiric rifaximin 4 pectoral overgrowth.  4.  CRC screening -- up-to-date, per Dr. Sharlett Iles in last colonoscopy repeat colonoscopy 2022  She will followup in one year, sooner if necessary

## 2013-11-07 NOTE — Progress Notes (Signed)
   Subjective:    Patient ID: [name], female    DOB: 04/16/1956, 58 y.o.   MRN: [id number]  HPI  Patient comes in today for followup on bilateral hip pain secondary to greater trochanteric bursitis. She has attended several sessions of physical therapy and overall has noticed some improvement but she also notes increasing pain any time weights are added for increased resistance. All of her hip pain is directly related to her shoe wear. If she wears a well cushioned shoe her hip pain resolves. 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 She also has chronic right knee pain which is also helped by well cushioned shoes. She continues on meloxicam and Flexeril when necessary. She is requesting a refill on her Flexeril today.    Review of Systems     Objective:   Physical Exam Well-developed, well-nourished. No acute distress. Awake alert and oriented x3. Vital signs reviewed.  Examination of each hip shows smooth painless hip range of motion. Negative log roll bilaterally. There is tenderness to palpation over the greater trochanteric bursa bilaterally. There is also continued hip abductor weakness with resistance. Neurovascularly she is intact distally. She is walking with only a mild limp.       Assessment & Plan:  Chronic bilateral greater trochanteric bursitis  Since well cushioned shoes greatly improve her pain I think she should try to wear these as much as possible. Unfortunately, she cannot wear these at work so I given her a pair of green sports insoles which are well cushioned to try in her work shoes. I've also refilled her Flexeril to take as needed. She'll continue on her Mobic prn as well. I do not think she would benefit from further physical therapy but she does understand the importance of continuing with her exercises at home. She will followup with me when necessary.

## 2013-11-07 NOTE — Patient Instructions (Signed)
Continue taking Pantoprazole daily.    We have sent the following medications to your pharmacy for you to pick up at your convenience: Amitiza  You have been given a FODmap diet.

## 2014-02-04 ENCOUNTER — Telehealth: Payer: Self-pay | Admitting: Internal Medicine

## 2014-02-04 ENCOUNTER — Other Ambulatory Visit: Payer: Self-pay

## 2014-02-04 MED ORDER — RIFAXIMIN 550 MG PO TABS: 550.0000 mg | ORAL_TABLET | Freq: Three times a day (TID) | ORAL | Status: DC

## 2014-02-04 NOTE — Telephone Encounter (Signed)
Patient reports she has been battling a pain that gets under her right rib cage and wraps around to her back. She describes it as a gas pain. She has tried natural remedies such as "anti-gas" medicine, probiotics and a colon cleanse. She got somewhat better as long as she continued to take the anti-gas medication. Now her pain is back and she is using a heating pad to her ribs. She has taken Oxycodone once, just so that she could sleep. Denies fever, vomiting or constipation. Please advise. Appointment?

## 2014-02-04 NOTE — Telephone Encounter (Signed)
Empiric rifaximin 550 mg 3 times daily x10 days for bacterial overgrowth to see if this helps with gas and bloating Have her update [place] on symptoms after completing therapy If symptoms remain she needs to be seen in the office

## 2014-02-05 NOTE — Telephone Encounter (Signed)
Spoke with the patient again today-she now states she would like to make an appointment-she has not picked up the prescription for Rifaximin and in fact, she called the pharmacy and cancelled her Rx-per her request she is scheduled for the first available appt

## 2014-02-06 ENCOUNTER — Encounter: Payer: Self-pay | Admitting: *Deleted

## 2014-02-12 ENCOUNTER — Other Ambulatory Visit (INDEPENDENT_AMBULATORY_CARE_PROVIDER_SITE_OTHER): Payer: BC Managed Care – PPO

## 2014-02-12 ENCOUNTER — Ambulatory Visit (INDEPENDENT_AMBULATORY_CARE_PROVIDER_SITE_OTHER): Payer: BC Managed Care – PPO | Admitting: Gastroenterology

## 2014-02-12 ENCOUNTER — Encounter: Payer: Self-pay | Admitting: Gastroenterology

## 2014-02-12 VITALS — BP 112/78 | HR 68 | Ht 66.0 in

## 2014-02-12 DIAGNOSIS — K5900 Constipation, unspecified: Secondary | ICD-10-CM

## 2014-02-12 DIAGNOSIS — R109 Unspecified abdominal pain: Secondary | ICD-10-CM

## 2014-02-12 LAB — COMPREHENSIVE METABOLIC PANEL
ALK PHOS: 104 U/L (ref 39–117)
ALT: 22 U/L (ref 0–35)
AST: 29 U/L (ref 0–37)
Albumin: 4 g/dL (ref 3.5–5.2)
BILIRUBIN TOTAL: 0.4 mg/dL (ref 0.2–1.2)
BUN: 6 mg/dL (ref 6–23)
CO2: 31 mEq/L (ref 19–32)
CREATININE: 0.7 mg/dL (ref 0.4–1.2)
Calcium: 10.4 mg/dL (ref 8.4–10.5)
Chloride: 104 mEq/L (ref 96–112)
GFR: 93.81 mL/min (ref >60.00)
GLUCOSE: 91 mg/dL (ref 70–99)
Potassium: 4.3 mEq/L (ref 3.5–5.1)
SODIUM: 139 meq/L (ref 135–145)
TOTAL PROTEIN: 7.5 g/dL (ref 6.0–8.3)

## 2014-02-12 LAB — CBC
HEMATOCRIT: 36.5 % (ref 36.0–46.0)
HEMOGLOBIN: 11.5 g/dL — AB (ref 12.0–15.0)
MCHC: 31.4 g/dL (ref 30.0–36.0)
MCV: 74.3 fl — AB (ref 78.0–100.0)
Platelets: 259 10*3/uL (ref 150.0–400.0)
RBC: 4.92 Mil/uL (ref 3.87–5.11)
RDW: 18.7 % — AB (ref 11.5–15.5)
WBC: 9.8 10*3/uL (ref 4.0–10.5)

## 2014-02-12 LAB — AMYLASE: AMYLASE: 62 U/L (ref 27–131)

## 2014-02-12 LAB — LIPASE: Lipase: 31 U/L (ref 11.0–59.0)

## 2014-02-12 MED ORDER — DICYCLOMINE HCL 20 MG PO TABS: 20.0000 mg | ORAL_TABLET | Freq: Two times a day (BID) | ORAL | Status: DC

## 2014-02-12 NOTE — Patient Instructions (Signed)
Your physician has requested that you go to the basement for the following lab work before leaving today: CBC CMP Amylase  Lipase  You have a follow up appointment with Dr. Hilarie Fredrickson scheduled for 04-16-2014 at 10 am   We have sent the following medications to your pharmacy for you to pick up at your convenience: Bentyl 20 mg  _________________________________________________________________________________________________________ Dennis Bast have been scheduled for a CT scan of the abdomen and pelvis at [facility] ([address]---this is in the same building as [facility]).   You are scheduled on 02-14-2014 at 330 pm. You should arrive 15 minutes prior to your appointment time for registration. Please follow the written instructions below on the day of your exam:  WARNING: IF YOU ARE ALLERGIC TO IODINE/X-RAY DYE, PLEASE NOTIFY RADIOLOGY IMMEDIATELY AT [phone]! YOU WILL BE GIVEN A 13 HOUR PREMEDICATION PREP.  1) Do not eat or drink anything after 1130 am (4 hours prior to your test) 2) You have been given 2 bottles of oral contrast to drink. The solution may taste better if refrigerated, but do NOT add ice or any other liquid to this solution. Shake well before drinking.    Drink 1 bottle of contrast @ 130 pm (2 hours prior to your exam)  Drink 1 bottle of contrast @ 230 pm (1 hour prior to your exam)  You may take any medications as prescribed with a small amount of water except for the following: Metformin, Glucophage, Glucovance, Avandamet, Riomet, Fortamet, Actoplus Met, Janumet, Glumetza or Metaglip. The above medications must be held the day of the exam AND 48 hours after the exam.  The purpose of you drinking the oral contrast is to aid in the visualization of your intestinal tract. The contrast solution may cause some diarrhea. Before your exam is started, you will be given a small amount of fluid to drink. Depending on your individual set of symptoms, you may also  receive an intravenous injection of x-ray contrast/dye. Plan on being at [facility] for 30 minutes or long, depending on the type of exam you are having performed.  This test typically takes 30-45 minutes to complete.  If you have any questions regarding your exam or if you need to reschedule, you may call the CT department at [phone] between the hours of 8:00 am and 5:00 pm, Monday-Friday.  ________________________________________________________________________

## 2014-02-12 NOTE — Progress Notes (Addendum)
02/12/2014 [name] [id number] 1956-04-18   History of Present Illness:  [name] is a 58 yo female with past medical history of IBS with constipation predominance, GERD, history of hemorrhoids, history of interstitial cystitis who is known to Dr. Hilarie Fredrickson. She had been managed by Dr. Sharlett Iles previously.  Her biggest issue has been irritable bowel with constipation predominance. She tried Linzess but did not have a favorable response (made her very sick).  She was placed on lubiprostone and was using 24 mcg at bedtime, because when she used it in the daytime she had nausea so then she was taking 48 mcg at bedtime.  She says that it worked for a while but does not seem to be helping anymore.  She stopped MiraLax due to bloating and excessive gas.  She is taking pantoprazole 40 mg once daily for reflux and further most part is well controlled her symptoms.  Her pantoprazole as written twice daily but she rarely if ever uses it twice daily. She eats a strict gluten-free diet, which she says helps her nausea and abdominal discomfort. She also tried the FODMAP diet which she found to be very helpful and made her feel better, but found it very difficult to adhere to.  Records reviewed. She's had negative celiac panel. A normal esophageal manometry in 2014. An EGD in 2012 with mild gastritis, H. pylori negative as well as a fundic gland polyp in the stomach; also with normal small bowel biopsies.  Had colonoscopy in March 2012 which was normal except for hemorrhoids. Dr. Sharlett Iles recommended repeating in 2022.  She comes in to the office today with complaints of abdominal pain.  Says that she has constant pain in her LUQ that radiates to her back; this pain began in mid-May.  She then has developed diffuse abdominal pains as well.  Has bloating and nausea.  Says that she has been missing work due to her symptoms ans says that she had never experienced pain like this before.  Her daughter took her to  a health food store where she purchased 4 different supplements.  One is a magnesium based supplement for constipation and she says that she has been moving her bowels well while taking that.  The others are a gas reducer, an intestine soothing medication, and the fourth is a liver cleansing medication.  They are all made of several herbs and supplements.  She says that the intestinal soothing one and the gas one seems to help somewhat but she still has the pain and is not sure if she should be taking these supplements long-term.  She complains of extreme fatigue and says that all she does is sleep recently.   Current Medications, Allergies, Past Medical History, Past Surgical History, Family History and Social History were reviewed in [employer] record.   Physical Exam: BP 112/78  Pulse 68  Ht 5\' 6"  (1.676 m)  PF 164 L/min General: Well developed white female in no acute distress Head: Normocephalic and atraumatic Eyes:  Sclerae anicteric, conjunctiva pink  Ears: Normal auditory acuity Lungs: Clear throughout to auscultation Heart: Regular rate and rhythm Abdomen: Soft, non-distended.  Normal bowel sounds.  She expresses diffuse TTP but abdominal exam is benign. Musculoskeletal: Symmetrical with no gross deformities  Extremities: No edema  Neurological: Alert oriented x 4, grossly non-focal Psychological:  Alert and cooperative. Normal mood and affect  Assessment and Recommendations: -Abdominal pain:  I suspect that her abdominal pain is all secondary  to her IBS with gas and intestinal spasm, but due to the duration of her symptoms and the expressed severity, we will obtain a CT scan of the abdomen and pelvis with contrast.  Will check CBC, CMP, amylase, and lipase today as well.  I have asked her to stop all of the supplements except for the magnesium one which she can likely continue safely and it is helping her constipation.  We will try Bentyl 20 mg BID for  intestinal spasm/cramping.  Consider trial of Xifaxan for SIBO if CT scan is negative and symptoms continue.  Follow-up in 4-8 weeks but will be in contact regarding results, etc in the interim.  Addendum: Reviewed and agree with initial management. Jerene Bears, MD

## 2014-02-14 ENCOUNTER — Ambulatory Visit (INDEPENDENT_AMBULATORY_CARE_PROVIDER_SITE_OTHER)
Admission: RE | Admit: 2014-02-14 | Discharge: 2014-02-14 | Disposition: A | Discharge status: Home / Self Care | Payer: BC Managed Care – PPO | Source: Ambulatory Visit | Attending: Gastroenterology | Admitting: Gastroenterology

## 2014-02-14 DIAGNOSIS — R109 Unspecified abdominal pain: Secondary | ICD-10-CM

## 2014-02-14 MED ORDER — IOHEXOL 300 MG/ML  SOLN
100.0000 mL | Freq: Once | INTRAMUSCULAR | Status: AC | PRN
  Administered 2014-02-14: 100 mL via INTRAVENOUS

## 2014-02-17 ENCOUNTER — Telehealth: Payer: Self-pay | Admitting: Gastroenterology

## 2014-02-17 NOTE — Telephone Encounter (Signed)
Jess please review and advise

## 2014-02-18 NOTE — Telephone Encounter (Signed)
See result notes for additional details

## 2014-03-05 ENCOUNTER — Telehealth: Payer: Self-pay | Admitting: Internal Medicine

## 2014-03-05 NOTE — Telephone Encounter (Signed)
[name],  Patient's pain was mostly LLQ but had some diffuse pain as well.  Please advise her that gallbladder pain tends to be mostly in RUQ and upper abdomen.  Could order HIDA scan if she is insistent, but in the interim I would like to try her on Xifaxan 550 mg TID for 14 days for IBS/small intestinal bacterial overgrowth as I stated in my note to see if this helps reset the intestinal flora.  There should be minimal to no side effects with this medication because it stays in the gut and does not get absorbed into the rest of the body.  She's had issues with other medication side effects in the past so be sure to tell her this so that she will complete the course and see if it helps.  Keep appt with Dr. Hilarie Fredrickson and we will contact her with HIDA scan results.  Thank you,  Jess

## 2014-03-05 NOTE — Telephone Encounter (Signed)
Patient reports pain today after lunch "doubles me over and gives me a cold sweat".  She is only able to tolerate a very bland diet.  Patient is scheduled for a follow up on 04/16/14 with Dr. Hilarie Fredrickson.  She reports that the dicyclomine does n ot help with these attacks "something in wrong with me".  We discussed the diagnosis of IBS and she feels her symptoms are gallbladder related.  She had a negative [place] in 2012 and 14.  Jess please advise the next step

## 2014-03-06 ENCOUNTER — Telehealth: Payer: Self-pay | Admitting: *Deleted

## 2014-03-06 MED ORDER — RIFAXIMIN 550 MG PO TABS: 550.0000 mg | ORAL_TABLET | Freq: Three times a day (TID) | ORAL | Status: DC

## 2014-03-06 NOTE — Telephone Encounter (Signed)
I called the patient to advise we had do redo the prior authorization.  I was told this had to go to medical review with the medical director.  I apologized that  We will call her as soon as we hear from the insurance company.

## 2014-03-06 NOTE — Telephone Encounter (Signed)
Called and spoke to the pharmacist at [facility], I gave him the PCN, BIN, Group and ID numbers on the [place] 0 copay card.  He said I need to do a prior auth due to the tid dosing.  I called [phone] and did a prior auth with [employer] and got an approval for the TID dosing, #42 tablets.  ( 1 tab 3 times daily for 14 days).  Case # [id number]. I called the pharmacist back and gave him the case number and advised him this was approved,

## 2014-03-06 NOTE — Telephone Encounter (Signed)
I called and gave the numbers on the Zero copay Xifaxan 550 mg card.  [id number], PCN: [id number],, ID [id number]. To [place], Pharmacist [facility].

## 2014-03-06 NOTE — Telephone Encounter (Signed)
[name] CMA and I called [employer]. They made [place] do another case for the quanity. We answered questions and it now has to go to a Market researcher.  They will inform us within 24 to 72 hours.

## 2014-03-06 NOTE — Telephone Encounter (Signed)
I spoke with the patient and advised her of the recommendations.  She would like to try xifaxan first.  I have sent rx to her pharmacy.  She will call back if this does not help. She is encouraged to keep the follow up with Dr. Hilarie Fredrickson on 9/9

## 2014-03-13 ENCOUNTER — Other Ambulatory Visit: Payer: Self-pay | Admitting: *Deleted

## 2014-03-13 MED ORDER — ONDANSETRON 4 MG PO TBDP: 4.0000 mg | ORAL_TABLET | Freq: Three times a day (TID) | ORAL | Status: DC | PRN

## 2014-03-13 MED ORDER — RIFAXIMIN 550 MG PO TABS: 550.0000 mg | ORAL_TABLET | Freq: Two times a day (BID) | ORAL | Status: DC

## 2014-03-13 NOTE — Telephone Encounter (Signed)
I called the patient and advised the Xifaxan 550 mg # 42 tablets was approved and they also used the savings card and her copay is Zero.  I told her per Janett Billow and Dr. Carlean Purl , she should take the  Tablets tid even though the prescription says twice daily.  I told her to call me after her course of medications.

## 2014-03-19 ENCOUNTER — Telehealth: Payer: Self-pay | Admitting: *Deleted

## 2014-03-19 NOTE — Telephone Encounter (Signed)
Dr Hilarie Fredrickson- We have gotten a refill request from patient's mail in pharmacy for clonazepam. This was previously provided by Dr Sharlett Iles (last rx for bid prn dosing given 09/05/13 for #180 with 2 refills). She should have enough until 05/2014. Do you want me to fill rx when she is due or should she get this from her PCP (she should not be in need of refill until 05/2014)?

## 2014-03-19 NOTE — Telephone Encounter (Signed)
Does she use this for anxiety or IBS? If anxiety, then should be deferred to PCP

## 2014-03-19 NOTE — Telephone Encounter (Signed)
Patient states that she is taking the clonazepam for anxiety. I have asked that she get further refills from her PCP for this. She verbalizes understanding.

## 2014-03-19 NOTE — Telephone Encounter (Signed)
Left message for patient to call back  

## 2014-04-16 ENCOUNTER — Ambulatory Visit (INDEPENDENT_AMBULATORY_CARE_PROVIDER_SITE_OTHER): Payer: BC Managed Care – PPO | Admitting: Internal Medicine

## 2014-04-16 ENCOUNTER — Encounter: Payer: Self-pay | Admitting: Internal Medicine

## 2014-04-16 ENCOUNTER — Ambulatory Visit: Payer: BC Managed Care – PPO

## 2014-04-16 ENCOUNTER — Other Ambulatory Visit (INDEPENDENT_AMBULATORY_CARE_PROVIDER_SITE_OTHER): Payer: BC Managed Care – PPO

## 2014-04-16 VITALS — BP 130/82 | HR 76 | Ht 66.0 in | Wt 162.1 lb

## 2014-04-16 DIAGNOSIS — Z91018 Allergy to other foods: Secondary | ICD-10-CM

## 2014-04-16 DIAGNOSIS — K589 Irritable bowel syndrome without diarrhea: Secondary | ICD-10-CM

## 2014-04-16 DIAGNOSIS — R918 Other nonspecific abnormal finding of lung field: Secondary | ICD-10-CM

## 2014-04-16 DIAGNOSIS — K5900 Constipation, unspecified: Secondary | ICD-10-CM

## 2014-04-16 DIAGNOSIS — K219 Gastro-esophageal reflux disease without esophagitis: Secondary | ICD-10-CM

## 2014-04-16 DIAGNOSIS — K9041 Non-celiac gluten sensitivity: Secondary | ICD-10-CM

## 2014-04-16 LAB — IBC PANEL
IRON: 30 ug/dL — AB (ref 42–145)
Saturation Ratios: 6.1 % — ABNORMAL LOW (ref 20.0–50.0)
Transferrin: 348.5 mg/dL (ref 212.0–360.0)

## 2014-04-16 LAB — CBC WITH DIFFERENTIAL/PLATELET
BASOS ABS: 0.1 10*3/uL (ref 0.0–0.1)
Basophils Relative: 0.8 % (ref 0.0–3.0)
Eosinophils Absolute: 0.1 10*3/uL (ref 0.0–0.7)
Eosinophils Relative: 1 % (ref 0.0–5.0)
HEMATOCRIT: 35.5 % — AB (ref 36.0–46.0)
Hemoglobin: 11.3 g/dL — ABNORMAL LOW (ref 12.0–15.0)
Lymphocytes Relative: 36.7 % (ref 12.0–46.0)
Lymphs Abs: 2.5 10*3/uL (ref 0.7–4.0)
MCHC: 31.8 g/dL (ref 30.0–36.0)
MCV: 74.9 fl — AB (ref 78.0–100.0)
MONO ABS: 0.6 10*3/uL (ref 0.1–1.0)
MONOS PCT: 9.6 % (ref 3.0–12.0)
NEUTROS PCT: 51.9 % (ref 43.0–77.0)
Neutro Abs: 3.5 10*3/uL (ref 1.4–7.7)
PLATELETS: 297 10*3/uL (ref 150.0–400.0)
RBC: 4.74 Mil/uL (ref 3.87–5.11)
RDW: 18.3 % — AB (ref 11.5–15.5)
WBC: 6.8 10*3/uL (ref 4.0–10.5)

## 2014-04-16 LAB — VITAMIN B12: VITAMIN B 12: 737 pg/mL (ref 211–911)

## 2014-04-16 LAB — COMPREHENSIVE METABOLIC PANEL
ALT: 26 U/L (ref 0–35)
AST: 22 U/L (ref 0–37)
Albumin: 4 g/dL (ref 3.5–5.2)
Alkaline Phosphatase: 84 U/L (ref 39–117)
BILIRUBIN TOTAL: 0.6 mg/dL (ref 0.2–1.2)
BUN: 9 mg/dL (ref 6–23)
CHLORIDE: 100 meq/L (ref 96–112)
CO2: 30 meq/L (ref 19–32)
Calcium: 10.7 mg/dL — ABNORMAL HIGH (ref 8.4–10.5)
Creatinine, Ser: 0.7 mg/dL (ref 0.4–1.2)
GFR: 86.38 mL/min (ref >60.00)
Glucose, Bld: 88 mg/dL (ref 70–99)
POTASSIUM: 4.8 meq/L (ref 3.5–5.1)
SODIUM: 137 meq/L (ref 135–145)
TOTAL PROTEIN: 7.5 g/dL (ref 6.0–8.3)

## 2014-04-16 LAB — VITAMIN D 25 HYDROXY (VIT D DEFICIENCY, FRACTURES): VITD: 30.31 ng/mL (ref 30.00–100.00)

## 2014-04-16 NOTE — Patient Instructions (Signed)
Your physician has requested that you go to the basement for the following lab work before leaving today:  CBC, CMP, B12  Continue the Fodmap diet  Please follow up with Dr. Hilarie Fredrickson in 6 months  You have a recall in the system for a chest ct in 01/2015.  You will receive a letter ahead of time to remind you to schedule this.

## 2014-04-16 NOTE — Progress Notes (Signed)
Subjective:    Patient ID: [name], female    DOB: 10-30-1955, 58 y.o.   MRN: [id number]  HPI [name] is a 58 year old female with past medical history of IBS with constipation predominance, GERD, history of hemorrhoids, history of interstitial cystitis who is seen in followup. She was seen by Alexandra Bogus, PA-C in July for complaints of severe abdominal pain as well as bloating and nausea. CT scan was performed which was relatively unrevealing but did show increased colonic stool burden, degenerative disc disease, diverticulosis without diverticulitis and small pulmonary nodules which were indeterminate.  Since her last visit she reports things have improved significantly. She has stopped lubiprostone which she was using 2 capsules at bedtime, and despite this her constipation persisted. Her upper abdominal pain has improved dramatically. She did have one week in late April where she ate gluten consistently and she reports this caused her to become strangely sick with lower and upper abdominal pain. She is now using 4 herbal therapies which has resulted in dramatic improvement in abdominal discomfort and regular bowel movements. She is having a good bowel movement most days without blood in her stool or melena. She is careful with her diet and avoids foods like peppers, cucumbers and cruciferous vegetables. No further nausea or vomiting. She is using "cleanse more" which she brings in today and includes cape aloe, rhubarb, marshmallow root, amla fruit, chebulia myrobalan fruit, belleric fruit. She is also using "Proactazyme" which includes protease, amylase and lipase.  "Anti-gas formula" which includes peppermint, papaya and fennel.  And finally "intestinal soothe and build" which includes slippery elm bark, rosehips.  She is also using Levsin on an as-needed basis he continues on pantoprazole 40 mg once daily. Her heartburn has been recently well-controlled and she denies  dysphagia.  Regarding the pulmonary nodule she did previously smoke tobacco for 16 years but quit 28 years ago. She has very little secondhand smoke exposure. She did discussed the CT results with [name] an advanced practitioner with her PCP office   Review of Systems  as per history of present illness, otherwise negative   Current Medications, Allergies, Past Medical History, Past Surgical History, Family History and Social History were reviewed in [employer] record.     Objective:   Physical Exam BP 130/82  Pulse 76  Ht '5\' 6"'  (1.676 m)  Wt 162 lb 2 oz (73.539 kg)  BMI 26.18 kg/m2 Constitutional: Well-developed and well-nourished. No distress. HEENT: Normocephalic and atraumatic. Oropharynx is clear and moist. No oropharyngeal exudate. Conjunctivae are normal.  No scleral icterus. Neck: Neck supple. Trachea midline. Cardiovascular: Normal rate, regular rhythm and intact distal pulses. No M/R/G Pulmonary/chest: Effort normal and breath sounds normal. No wheezing, rales or rhonchi. Abdominal: Soft, nontender, nondistended. Bowel sounds active throughout.  Extremities: no clubbing, cyanosis, or edema Neurological: Alert and oriented to person place and time. Skin: Skin is warm and dry. No rashes noted. Psychiatric: Normal mood and affect. Behavior is normal.  CBC    Component Value Date/Time   WBC 9.8 02/12/2014 0955   RBC 4.92 02/12/2014 0955   HGB 11.5* 02/12/2014 0955   HCT 36.5 02/12/2014 0955   PLT 259.0 02/12/2014 0955   MCV 74.3* 02/12/2014 0955   MCHC 31.4 02/12/2014 0955   RDW 18.7* 02/12/2014 0955   LYMPHSABS 2.2 10/04/2012 1141   MONOABS 0.4 10/04/2012 1141   EOSABS 0.1 10/04/2012 1141   BASOSABS 0.0 10/04/2012 1141   CMP  Component Value Date/Time   NA 139 02/12/2014 0955   K 4.3 02/12/2014 0955   CL 104 02/12/2014 0955   CO2 31 02/12/2014 0955   GLUCOSE 91 02/12/2014 0955   BUN 6 02/12/2014 0955   CREATININE 0.7 02/12/2014 0955   CALCIUM 10.4 02/12/2014  0955   PROT 7.5 02/12/2014 0955   ALBUMIN 4.0 02/12/2014 0955   AST 29 02/12/2014 0955   ALT 22 02/12/2014 0955   ALKPHOS 104 02/12/2014 0955   BILITOT 0.4 02/12/2014 0955   GFRNONAA 92.12 10/06/2009 1007   GFRAA  Value: >60        The eGFR has been calculated using the MDRD equation. This calculation has not been validated in all clinical situations. eGFR's persistently <60 mL/min signify possible Chronic Kidney Disease. 10/29/2008 3612    CLINICAL DATA:  Left upper quadrant abdominal pain radiating to the back since May associated with nausea. History of diverticulosis   EXAM: CT ABDOMEN AND PELVIS WITH CONTRAST   TECHNIQUE: Multidetector CT imaging of the abdomen and pelvis was performed using the standard protocol following bolus administration of intravenous contrast.   CONTRAST:  141m OMNIPAQUE IOHEXOL 300 MG/ML  SOLN   COMPARISON:  Abdominal ultrasound - 11/16/2012; 08/13/2010   FINDINGS: Normal hepatic contour. No discrete hepatic lesions. The gallbladder is under distended but otherwise normal. No definite radiopaque gallstones. No intra or extrahepatic biliary ductal dilatation. No ascites. There is symmetric enhancement and excretion of the bilateral kidneys. No definite renal stones. No discrete renal lesions. No urinary obstruction or perinephric stranding. Normal appearance of the bilateral adrenal glands, pancreas and spleen.   Ingested enteric contrast extends to the level of the descending colon. Moderate colonic stool burden without evidence of obstruction. Scattered minimal colonic diverticulosis without evidence of diverticulitis. The bowel is otherwise normal in course and caliber without wall thickening. Normal appearance of the appendix. No pneumoperitoneum, pneumatosis or portal venous gas.   Scattered minimal atherosclerotic plaque within a normal caliber abdominal aorta. The major branch vessels of the abdominal aorta appear widely patent on this non CTA  examination. Scattered retroperitoneal lymph nodes are not enlarged by size criteria. No retroperitoneal, mesenteric, pelvic or inguinal lymphadenopathy.   Post hysterectomy. No discrete adnexal lesion. No free fluid in the pelvis. Normal appearance of the urinary bladder given degree distention.   Limited visualization of the lower thorax demonstrates an approximately 4 mm noncalcified nodule within imaged left lower lobe (image 7, series 3). There is a smaller approximately 3 mm noncalcified nodule within image right middle lobe (image 3, series 3). There is minimal dependent subpleural ground-glass atelectasis. No pleural effusion.   Normal heart size.  No pericardial effusion.   No acute or aggressive osseus abnormalities. Mild to moderate multilevel lumbar spine DDD, worse at L3-L4 and L4-L5 with disc space height loss, endplate irregularity and sclerosis. Regional soft tissues appear normal.   IMPRESSION: 1. No explanation for patient's left upper quadrant abdominal pain. Specifically, no evidence of enteric or urinary obstruction. 2. Minimal colonic diverticulosis without evidence of diverticulitis. 3. Moderate colonic stool burden without evidence of obstruction. 4. Mild to moderate multilevel lumbar spine DDD, worse at L3-L4 and L4-L5. 5. Indeterminate pulmonary nodules within the imaged lower lungs, the largest of which within the left lower lobe measuring 4 mm in diameter. If the patient is at high risk for bronchogenic carcinoma, follow-up chest CT at 1 year is recommended. If the patient is at low risk, no follow-up is needed. This recommendation follows  the consensus statement: Guidelines for Management of Small Pulmonary Nodules Detected on CT Scans: A Statement from the [place] as published in Radiology 2005; 237:395-400.     Electronically Signed   By: Sandi Mariscal M.D.   On: 02/14/2014 17:35     Assessment & Plan:  58 year old female with past  medical history of IBS with constipation predominance, GERD, history of hemorrhoids, history of interstitial cystitis who is seen in followup.   1. IBS-C with gluten sensitivity-- her abdominal discomfort which was occurring in May and June has improved dramatically. She is benefiting from herbal supplementation from a constipation perspective, and she is also taking pancreatic enzymes which is likely helping with her irritable bowel. She is very pleased with her current therapy and she will continue these over-the-counter formulations. I am checking labs today specifically CBC and CMP along with B12. I have recommended that she continue FODMAP diet.  She is happy with this plan  2.  GERD -- continue pantoprazole 40 mg daily. Currently well controlled  3.  Pulmonary nodules -- indeterminate and small. No recent tobacco use. Will repeat CT chest in 12 months is recommended by radiology, reminder for July 2016  Followup in 6 months, sooner necessary

## 2014-04-21 ENCOUNTER — Other Ambulatory Visit: Payer: Self-pay

## 2014-04-21 DIAGNOSIS — D649 Anemia, unspecified: Secondary | ICD-10-CM

## 2014-04-21 DIAGNOSIS — D619 Aplastic anemia, unspecified: Secondary | ICD-10-CM

## 2014-04-21 MED ORDER — INTEGRA 62.5-62.5-40-3 MG PO CAPS: 1.0000 | ORAL_CAPSULE | Freq: Every day | ORAL | Status: DC

## 2014-06-05 ENCOUNTER — Other Ambulatory Visit (INDEPENDENT_AMBULATORY_CARE_PROVIDER_SITE_OTHER): Payer: BC Managed Care – PPO

## 2014-06-05 DIAGNOSIS — D619 Aplastic anemia, unspecified: Secondary | ICD-10-CM

## 2014-06-05 LAB — HEMOCCULT SLIDES (X 3 CARDS)
Fecal Occult Blood: NEGATIVE
OCCULT 1: NEGATIVE
OCCULT 2: NEGATIVE
OCCULT 3: NEGATIVE
OCCULT 4: NEGATIVE
OCCULT 5: NEGATIVE

## 2014-07-15 ENCOUNTER — Telehealth: Payer: Self-pay | Admitting: Internal Medicine

## 2014-07-15 NOTE — Telephone Encounter (Signed)
Herbal supplements were working for her, please inquire by phone about this refill. If she has been on it, it can be refilled

## 2014-07-15 NOTE — Telephone Encounter (Signed)
Dr Hilarie Fredrickson, patient requests refills of pantoprazole and amitiza. I will send pantoprazole. However, I cant find where patient has had Amitiza since 2012. Per your last office note, it appears she was taking herbal supplements instead. May I go ahead and fill the Amitiza?

## 2014-07-15 NOTE — Telephone Encounter (Signed)
Left message for patient to call back  

## 2014-07-16 MED ORDER — PANTOPRAZOLE SODIUM 40 MG PO TBEC: 40.0000 mg | DELAYED_RELEASE_TABLET | Freq: Two times a day (BID) | ORAL | Status: DC

## 2014-07-16 MED ORDER — LUBIPROSTONE 24 MCG PO CAPS: 24.0000 ug | ORAL_CAPSULE | Freq: Two times a day (BID) | ORAL | Status: DC

## 2014-07-16 NOTE — Telephone Encounter (Signed)
Patient states that the herbal supplements were working very well for her but then stopped. She has recently started back on Amitiza twice daily and this seems to help. I will send refill to her pharmacy for this.

## 2014-07-16 NOTE — Addendum Note (Signed)
Addended by: Larina Bras on: 07/16/2014 02:01 PM   Modules accepted: Orders

## 2014-07-22 ENCOUNTER — Telehealth: Payer: Self-pay

## 2014-07-22 NOTE — Telephone Encounter (Signed)
Left message for pt to call back  °

## 2014-07-22 NOTE — Telephone Encounter (Signed)
-----   Message from Maury Dus, RN sent at 04/21/2014  2:23 PM EDT ----- Regarding: Labs Pt needs labs in Dec, orders in epic.

## 2014-07-23 ENCOUNTER — Telehealth: Payer: Self-pay | Admitting: Internal Medicine

## 2014-07-23 NOTE — Telephone Encounter (Signed)
Pt aware.

## 2014-07-24 NOTE — Telephone Encounter (Signed)
Patient's pantoprazole twice daily has been approved through patient's insurance from 07/24/14-07/24/15. PA# is PA [id number]. I have contacted [employer] to advise that they may run rx claim again. Unfortunately, after being transferred several times (15 minutes on hold) I was disconnected. I have spoken to patient to advise that insurance approved pantoprazole. She verbalizes understanding.

## 2014-08-19 ENCOUNTER — Telehealth: Payer: Self-pay | Admitting: Internal Medicine

## 2014-08-19 NOTE — Telephone Encounter (Signed)
Left message advising patient that she may still come for labs as orders are in the system. She can call back with any additional questions.

## 2014-08-25 ENCOUNTER — Other Ambulatory Visit (INDEPENDENT_AMBULATORY_CARE_PROVIDER_SITE_OTHER): Payer: Self-pay

## 2014-08-25 DIAGNOSIS — D619 Aplastic anemia, unspecified: Secondary | ICD-10-CM

## 2014-08-25 DIAGNOSIS — D649 Anemia, unspecified: Secondary | ICD-10-CM

## 2014-08-25 LAB — CBC WITH DIFFERENTIAL/PLATELET
Basophils Absolute: 0 10*3/uL (ref 0.0–0.1)
Basophils Relative: 0.8 % (ref 0.0–3.0)
EOS ABS: 0 10*3/uL (ref 0.0–0.7)
EOS PCT: 0.7 % (ref 0.0–5.0)
HEMATOCRIT: 36.2 % (ref 36.0–46.0)
Hemoglobin: 11.3 g/dL — ABNORMAL LOW (ref 12.0–15.0)
LYMPHS ABS: 2.4 10*3/uL (ref 0.7–4.0)
Lymphocytes Relative: 38 % (ref 12.0–46.0)
MCHC: 31.2 g/dL (ref 30.0–36.0)
MCV: 78.4 fl (ref 78.0–100.0)
MONO ABS: 0.7 10*3/uL (ref 0.1–1.0)
MONOS PCT: 11.5 % (ref 3.0–12.0)
NEUTROS ABS: 3.1 10*3/uL (ref 1.4–7.7)
NEUTROS PCT: 49 % (ref 43.0–77.0)
Platelets: 262 10*3/uL (ref 150.0–400.0)
RBC: 4.62 Mil/uL (ref 3.87–5.11)
RDW: 16.4 % — ABNORMAL HIGH (ref 11.5–15.5)
WBC: 6.4 10*3/uL (ref 4.0–10.5)

## 2014-08-25 LAB — IBC PANEL
IRON: 47 ug/dL (ref 42–145)
Saturation Ratios: 9.4 % — ABNORMAL LOW (ref 20.0–50.0)
TRANSFERRIN: 357 mg/dL (ref 212.0–360.0)

## 2014-08-25 LAB — FERRITIN: FERRITIN: 4.7 ng/mL — AB (ref 10.0–291.0)

## 2014-09-01 ENCOUNTER — Other Ambulatory Visit: Payer: Self-pay

## 2014-09-01 DIAGNOSIS — D509 Iron deficiency anemia, unspecified: Secondary | ICD-10-CM

## 2014-09-02 ENCOUNTER — Telehealth: Payer: Self-pay | Admitting: Oncology

## 2014-09-02 NOTE — Telephone Encounter (Signed)
S/W PATIENT AND GAVE NP APPT FOR 02/17 @ 10:30 W/DR. SHADAD.  REFERRING DR. Zenovia Jarred DX-IDA

## 2014-09-04 ENCOUNTER — Telehealth: Payer: Self-pay | Admitting: Internal Medicine

## 2014-09-04 NOTE — Telephone Encounter (Signed)
Pt wanted to know her blood count and ferritin levels. Results discussed over the phone with pt.

## 2014-09-05 ENCOUNTER — Encounter: Payer: Self-pay | Admitting: *Deleted

## 2014-09-12 ENCOUNTER — Encounter: Payer: Self-pay | Admitting: *Deleted

## 2014-09-17 ENCOUNTER — Ambulatory Visit: Payer: BC Managed Care – PPO | Admitting: Internal Medicine

## 2014-09-17 ENCOUNTER — Ambulatory Visit (INDEPENDENT_AMBULATORY_CARE_PROVIDER_SITE_OTHER): Payer: BLUE CROSS/BLUE SHIELD | Admitting: Internal Medicine

## 2014-09-17 ENCOUNTER — Encounter: Payer: Self-pay | Admitting: Internal Medicine

## 2014-09-17 ENCOUNTER — Telehealth: Payer: Self-pay | Admitting: Internal Medicine

## 2014-09-17 VITALS — BP 138/78 | HR 70 | Ht 66.0 in | Wt 160.2 lb

## 2014-09-17 DIAGNOSIS — K589 Irritable bowel syndrome without diarrhea: Secondary | ICD-10-CM

## 2014-09-17 DIAGNOSIS — D509 Iron deficiency anemia, unspecified: Secondary | ICD-10-CM

## 2014-09-17 DIAGNOSIS — K5900 Constipation, unspecified: Secondary | ICD-10-CM

## 2014-09-17 DIAGNOSIS — R918 Other nonspecific abnormal finding of lung field: Secondary | ICD-10-CM

## 2014-09-17 MED ORDER — POLYETHYLENE GLYCOL 3350 17 GM/SCOOP PO POWD: ORAL | Status: DC

## 2014-09-17 MED ORDER — MOVIPREP 100 G PO SOLR: 1.0000 | Freq: Once | ORAL | Status: DC

## 2014-09-17 NOTE — Patient Instructions (Signed)
You have been scheduled for an endoscopy and colonoscopy. Please follow the written instructions given to you at your visit today. Please pick up your prep at the pharmacy within the next 1-3 days. If you use inhalers (even only as needed), please bring them with you on the day of your procedure. Your physician has requested that you go to www.[email or web address].[email or web address] and enter the access code given to you at your visit today. This web site gives a general overview about your procedure. However, you should still follow specific instructions given to you by our office regarding your preparation for the procedure.  Please continue Amitiza 24 mcg twice daily.  Please continue Bentyl as needed.  Cc:Dr Pat Kocher

## 2014-09-17 NOTE — Telephone Encounter (Signed)
Advised pharmacy that unfortunately, we need to use Moviprep because patient is doing a 2 day colonoscopy prep using Moviprep and Miralax. He will let patient know.

## 2014-09-17 NOTE — Progress Notes (Signed)
Subjective:    Patient ID: [name], female    DOB: January 18, 1956, 59 y.o.   MRN: [id number]  HPI [name] is a 59 yo female with PMH of IBS with constipation, GERD, history of hemorrhoids, history of interstitial cystitis who is seen for follow-up. She was last seen in the office on 04/16/2014. At that time she was doing quite well and taking multiple herbal supplements to help with her constipation. She reports this has subsequently stopped working altogether and she is no longer taking these medications. She did try them again without benefit. She is currently taking lubiprostone 24 g twice a day though she is not taking it twice per day on every day. Occasionally she still is having issues with constipation and using Ex-Lax. She will sometimes take 2-4 Ex-Lax her day. When her constipation is severe she developed lower abdominal cramping pain and becomes presyncopal. She says this is been happening for years. She gets lower abdominal cramping, lightheadedness, nausea, occasional vomiting and diaphoresis. This will occur several times a year and last happened this past Sunday. She uses Bentyl which helps her lower abdominal cramping pain but on occasion she cannot take it quick enough to benefit her.  Separate from this she had an episode of choking which occurred near Christmas of 2015. She was eating sausage and developed trouble breathing and pain in her upper right chest. Her husband perform the Heimlich maneuver and was able to dislodge the meat. There was question whether this could've been an esophageal food impaction versus tracheal obstruction. It was associated with coughing. She denies subsequent dysphagia or odynophagia. Denies heartburn but is on Protonix twice daily.  After her last visit she submitted stool cards in October which were negative for fecal occult blood 5  Review of Systems As per history of present illness, otherwise negative  Current Medications, Allergies,  Past Medical History, Past Surgical History, Family History and Social History were reviewed in [employer] record.     Objective:   Physical Exam BP 138/78 mmHg  Pulse 70  Ht '5\' 6"'  (1.676 m)  Wt 160 lb 3.2 oz (72.666 kg)  BMI 25.87 kg/m2  SpO2 97% Constitutional: Well-developed and well-nourished. No distress. HEENT: Normocephalic and atraumatic. Oropharynx is clear and moist. No oropharyngeal exudate. Conjunctivae are normal.  No scleral icterus. Neck: Neck supple. Trachea midline. Cardiovascular: Normal rate, regular rhythm and intact distal pulses. No M/R/G Pulmonary/chest: Effort normal and breath sounds normal. No wheezing, rales or rhonchi. Abdominal: Soft, diffuse mild tenderness without rebound or guarding, nondistended. Bowel sounds active throughout. There are no masses palpable. No hepatosplenomegaly. Extremities: no clubbing, cyanosis, or edema Lymphadenopathy: No cervical adenopathy noted. Neurological: Alert and oriented to person place and time. Skin: Skin is warm and dry. No rashes noted. Psychiatric: Normal mood and affect. Behavior is normal.  CBC    Component Value Date/Time   WBC 6.4 08/25/2014 1052   RBC 4.62 08/25/2014 1052   HGB 11.3* 08/25/2014 1052   HCT 36.2 08/25/2014 1052   PLT 262.0 08/25/2014 1052   MCV 78.4 08/25/2014 1052   MCHC 31.2 08/25/2014 1052   RDW 16.4* 08/25/2014 1052   LYMPHSABS 2.4 08/25/2014 1052   MONOABS 0.7 08/25/2014 1052   EOSABS 0.0 08/25/2014 1052   BASOSABS 0.0 08/25/2014 1052    CMP     Component Value Date/Time   NA 137 04/16/2014 1037   K 4.8 04/16/2014 1037   CL 100 04/16/2014 1037   CO2  30 04/16/2014 1037   GLUCOSE 88 04/16/2014 1037   BUN 9 04/16/2014 1037   CREATININE 0.7 04/16/2014 1037   CALCIUM 10.7* 04/16/2014 1037   PROT 7.5 04/16/2014 1037   ALBUMIN 4.0 04/16/2014 1037   AST 22 04/16/2014 1037   ALT 26 04/16/2014 1037   ALKPHOS 84 04/16/2014 1037   BILITOT 0.6 04/16/2014  1037   GFRNONAA 92.12 10/06/2009 1007   GFRAA  10/29/2008 0614    >60        The eGFR has been calculated using the MDRD equation. This calculation has not been validated in all clinical situations. eGFR's persistently <60 mL/min signify possible Chronic Kidney Disease.    Iron/TIBC/Ferritin/ %Sat    Component Value Date/Time   IRON 47 08/25/2014 1052   FERRITIN 4.7* 08/25/2014 1052   IRONPCTSAT 9.4* 08/25/2014 1052   FOBT neg x 12 May 2014  Colonoscopy 2012 [name], poor prep, no polyps or lesions seen EGD reviewed     Assessment & Plan:  59 yo female with PMH of IBS with constipation, GERD, history of hemorrhoids, history of interstitial cystitis who is seen for follow-up.  1.  IBS-C with lower abd cramping -- her IBS is severe and can lead to episodes which are consistent with vasovagal response in relation to bowel movement and severe colonic spasm. I recommended that she be more consistent with lubiprostone and take this 24 g twice daily. I would rather have more loose stools then become constipated and have a painful or presyncopal episode. She can continue to use Bentyl on an as-needed basis.  2. Choking -- isolated episode sounds more like respiratory obstruction than esophageal obstruction. No symptoms of dysphagia though we will proceed to EGD, see #3  3. IDA -- she has iron deficiency anemia which is unexplained. She previously donated blood but not for 3-4 years. She's had no visible bleeding. She cannot tolerate oral iron due to constipation. She has tried multiple different iron preparations. She has an upcoming appointment with hematology for consideration of IV iron next week. --Stool heme negative in October, but given her ongoing issues with lower abdominal pain constipation and the fact that her colonoscopy performed in 2012 had poor preparation, I have recommended repeating endoscopic evaluation to exclude source of iron deficiency anemia. We discussed the  risks and benefits of colonoscopy and upper endoscopy and she is agreeable to proceed  4. Pulmonary nodules -- indeterminate recommend repeat chest CT in July 2016 to assess stability  Over 40 min spend with pt of which at least 50% was counseling regarding the above issues

## 2014-09-23 ENCOUNTER — Telehealth: Payer: Self-pay | Admitting: *Deleted

## 2014-09-23 NOTE — Telephone Encounter (Signed)
I called and left a message reminding patient about her appointments for Wednesday, 09/24/14. Please call [phone] if she had any questions.

## 2014-09-24 ENCOUNTER — Ambulatory Visit: Payer: BLUE CROSS/BLUE SHIELD

## 2014-09-24 ENCOUNTER — Telehealth: Payer: Self-pay | Admitting: Oncology

## 2014-09-24 ENCOUNTER — Ambulatory Visit (HOSPITAL_BASED_OUTPATIENT_CLINIC_OR_DEPARTMENT_OTHER): Payer: BLUE CROSS/BLUE SHIELD | Admitting: Oncology

## 2014-09-24 ENCOUNTER — Other Ambulatory Visit: Payer: Self-pay

## 2014-09-24 ENCOUNTER — Encounter: Payer: Self-pay | Admitting: Oncology

## 2014-09-24 VITALS — BP 137/75 | HR 64 | Temp 98.1°F | Resp 18 | Ht 66.0 in | Wt 161.7 lb

## 2014-09-24 DIAGNOSIS — R1084 Generalized abdominal pain: Secondary | ICD-10-CM

## 2014-09-24 DIAGNOSIS — K5900 Constipation, unspecified: Secondary | ICD-10-CM

## 2014-09-24 DIAGNOSIS — D509 Iron deficiency anemia, unspecified: Secondary | ICD-10-CM | POA: Insufficient documentation

## 2014-09-24 NOTE — Consult Note (Signed)
Reason for Referral: Iron deficiency anemia.   HPI: 59 year old woman currently lives in [place] but gets her specialty care in this area. She has a history of interstitial cystitis, irritable bowel syndrome with constipation among others. She also has had heavy menstrual cycles majority of her adult life and in the having a hysterectomy 10 years ago. At that time she was diagnosed with iron deficiency anemia and was recommended to take iron supplements. She was not able to tolerate them and caused her severe constipation because of her IBS. Most recently her hemoglobin drifted down from 13 down to 11.3. Her MCV is low at 74.9 with RDW of 18.3 her last ferritin on 08/25/2014 was 4.7. She was evaluated by gastroenterology and she is scheduled to have a repeat endoscopy and colonoscopy in the near future. She does not report any bleeding symptoms at this time. She does not report any hematochezia or melena. She does not report any genitourinary or bleeding or GYN bleeding. She does not report any history of abdominal surgery for bowel resection. She is symptomatic from her anemia with symptoms of fatigue, tiredness and occasional headaches. She does not report eyes cravings. She does not report any blurry vision, syncope or seizures. She does not report any chest pain, palpitation, leg edema or orthopnea. She does not report any wheezing, cough or hemoptysis. She does not report any nausea, vomiting she does report some occasional abdominal pain and constipation. She does not report any frequency, urgency, hesitancy or hematuria. She does not report any skeletal complaints of arthralgias or myalgias. She does not report any lymphadenopathy or petechiae. Rest of her review of systems unremarkable.   Past Medical History  Diagnosis Date  . Bladder disease   . Syncope   . Hepatic cyst   . Chronic interstitial cystitis   . Depressive disorder, not elsewhere classified   . Esophageal reflux   . Unspecified  constipation   . Irritable bowel syndrome   . Anemia   . Hemorrhoids   . Pulmonary nodules   . Fundic gland polyps of stomach, benign   . GERD (gastroesophageal reflux disease)   :  Past Surgical History  Procedure Laterality Date  . Oophorectomy    . Cystectomy    . Milk gland removal    . Vaginal hysterectomy    . Laparoscopy    . Esophageal manometry N/A 10/22/2012    Procedure: ESOPHAGEAL MANOMETRY (EM);  Surgeon: Sable Feil, MD;  Location: [facility];  Service: Endoscopy;  Laterality: N/A;  . Esophagogastroduodenoscopy  10/08/2010  . Colonoscopy  10/08/2010    Normal--multiple   . Dilation and curettage of uterus    :   Current outpatient prescriptions:  .  clonazePAM (KLONOPIN) 0.5 MG tablet, Take 1 tablet (0.5 mg total) by mouth 2 (two) times daily as needed., Disp: 180 tablet, Rfl: 2 .  dicyclomine (BENTYL) 20 MG tablet, Take 1 tablet (20 mg total) by mouth 2 (two) times daily., Disp: 60 tablet, Rfl: 1 .  escitalopram (LEXAPRO) 20 MG tablet, Take 20 mg by mouth daily.  , Disp: , Rfl:  .  hyoscyamine (LEVSIN, ANASPAZ) 0.125 MG tablet, Take 1 tablet (0.125 mg total) by mouth as needed., Disp: 180 tablet, Rfl: 3 .  lubiprostone (AMITIZA) 24 MCG capsule, Take 1 capsule (24 mcg total) by mouth 2 (two) times daily with a meal., Disp: 60 capsule, Rfl: 1 .  meloxicam (MOBIC) 15 MG tablet, Take 1 tablet (15 mg total) by mouth daily as  needed for pain (take with food.)., Disp: 60 tablet, Rfl: 1 .  MOVIPREP 100 G SOLR, Take 1 kit (200 g total) by mouth once., Disp: 1 kit, Rfl: 0 .  ondansetron (ZOFRAN ODT) 4 MG disintegrating tablet, Take 1 tablet (4 mg total) by mouth every 8 (eight) hours as needed for nausea or vomiting., Disp: 30 tablet, Rfl: 0 .  pantoprazole (PROTONIX) 40 MG tablet, Take 1 tablet (40 mg total) by mouth 2 (two) times daily., Disp: 180 tablet, Rfl: 1 .  polyethylene glycol powder (GLYCOLAX/MIRALAX) powder, Use as directed along with Moviprep for colonoscopy  prep, Disp: 238 g, Rfl: 0:  Allergies  Allergen Reactions  . Aspirin Nausea Only  :  Family History  Problem Relation Age of Onset  . Heart attack Mother   . Bladder Cancer Mother   . Pancreatic cancer Paternal Grandfather   . Diabetes Paternal Grandmother   . Colon cancer Neg Hx   . Bladder Cancer Father   . Stroke Paternal Grandmother   :  History   Social History  . Marital Status: Married    Spouse Name: N/A  . Number of Children: 3  . Years of Education: N/A   Occupational History  . clerk of social services    Social History Main Topics  . Smoking status: Former Smoker    Types: Cigarettes    Quit date: 08/09/1987  . Smokeless tobacco: Never Used  . Alcohol Use: Yes     Comment: Occas.  . Drug Use: No  . Sexual Activity: Not on file   Other Topics Concern  . Not on file   Social History Narrative  :  Pertinent items are noted in HPI.  Exam: ECOG 0 Blood pressure 137/75, pulse 64, temperature 98.1 F (36.7 C), temperature source Oral, resp. rate 18, height 5' 6" (1.676 m), weight 161 lb 11.2 oz (73.347 kg), SpO2 99 %. General appearance: alert and cooperative Head: Normocephalic, without obvious abnormality Throat: lips, mucosa, and tongue normal; teeth and gums normal Neck: no adenopathy Back: negative Resp: clear to auscultation bilaterally Chest wall: no tenderness Cardio: regular rate and rhythm, S1, S2 normal, no murmur, click, rub or gallop GI: soft, non-tender; bowel sounds normal; no masses,  no organomegaly Extremities: extremities normal, atraumatic, no cyanosis or edema Pulses: 2+ and symmetric Skin: Skin color, texture, turgor normal. No rashes or lesions Lymph nodes: Cervical, supraclavicular, and axillary nodes normal.   CBC    Component Value Date/Time   WBC 6.4 08/25/2014 1052   RBC 4.62 08/25/2014 1052   HGB 11.3* 08/25/2014 1052   HCT 36.2 08/25/2014 1052   PLT 262.0 08/25/2014 1052   MCV 78.4 08/25/2014 1052   MCHC 31.2  08/25/2014 1052   RDW 16.4* 08/25/2014 1052   LYMPHSABS 2.4 08/25/2014 1052   MONOABS 0.7 08/25/2014 1052   EOSABS 0.0 08/25/2014 1052   BASOSABS 0.0 08/25/2014 1052    Iron/TIBC/Ferritin/ %Sat    Component Value Date/Time   IRON 47 08/25/2014 1052   FERRITIN 4.7* 08/25/2014 1052   IRONPCTSAT 9.4* 08/25/2014 1052     Assessment and Plan:   58-year-old 1 with the following issues:  1. Iron deficiency anemia documented by a hemoglobin of 11.3 and ferritin of 4.7. The etiology of her iron deficiency could be related to history of menorrhagia corrected by hysterectomy but have not really fully repleted her iron stores. She also could have iron losing enteropathy or difficulty of gut absorption of iron. This could be related   to her IBS or other GI issues that could be interfering with her ability to absorb iron. She has tried oral iron in the past with very little effects and potentially complications associated with dyspepsia as well as constipation.  Treatment options were discussed today and I feel the best way to correct her iron would be IV iron infusion. Different formulation were discussed today and I feel the best approach would be using Feraheme a total of 1000 mg in split doses of every week apart. Complications from IV iron infusion were discussed which includes arthralgias, myalgias, infusion-related reaction and rarely anaphylaxis. She is agreeable to proceed and we'll set that up for her in the near future. I will repeat her iron studies in 3 months to check on the effectiveness of this approach.  2. Abdominal pain, constipation associated with iron deficiency: I agree with GI workup to rule out localized bleeding loss and rule out malignancy. I doubt this is the cause of her iron deficiency but certainly agree with her workup.  3. Follow-up: Will be in 3 months to recheck her hemoglobin on iron studies.

## 2014-09-24 NOTE — Progress Notes (Signed)
Checked in new pt with no financial concerns.  Pt states she's here for a hematology concern so financial assistance may not be needed but she has Raquel's card for any billing questions or concerns.

## 2014-09-24 NOTE — Telephone Encounter (Signed)
gave pt avs report and appts for feb and may

## 2014-09-24 NOTE — Progress Notes (Signed)
Please see consult note.  

## 2014-09-25 ENCOUNTER — Encounter: Payer: Self-pay | Admitting: Internal Medicine

## 2014-09-26 ENCOUNTER — Telehealth: Payer: Self-pay | Admitting: Internal Medicine

## 2014-09-26 NOTE — Telephone Encounter (Signed)
Patient advised that there is no contraindication between iron infusion and colonoscopy. She verbalizes understanding.

## 2014-09-29 ENCOUNTER — Ambulatory Visit (HOSPITAL_BASED_OUTPATIENT_CLINIC_OR_DEPARTMENT_OTHER): Payer: BLUE CROSS/BLUE SHIELD

## 2014-09-29 DIAGNOSIS — D509 Iron deficiency anemia, unspecified: Secondary | ICD-10-CM

## 2014-09-29 MED ORDER — SODIUM CHLORIDE 0.9 % IV SOLN
Freq: Once | INTRAVENOUS | Status: AC
  Administered 2014-09-29: 14:00:00 via INTRAVENOUS

## 2014-09-29 MED ORDER — FERUMOXYTOL INJECTION 510 MG/17 ML
510.0000 mg | Freq: Once | INTRAVENOUS | Status: AC
  Administered 2014-09-29: 510 mg via INTRAVENOUS
  Filled 2014-09-29: qty 17

## 2014-09-29 NOTE — Patient Instructions (Signed)

## 2014-10-01 ENCOUNTER — Ambulatory Visit: Payer: BLUE CROSS/BLUE SHIELD

## 2014-10-06 ENCOUNTER — Ambulatory Visit (HOSPITAL_BASED_OUTPATIENT_CLINIC_OR_DEPARTMENT_OTHER): Payer: BLUE CROSS/BLUE SHIELD

## 2014-10-06 DIAGNOSIS — D509 Iron deficiency anemia, unspecified: Secondary | ICD-10-CM

## 2014-10-06 MED ORDER — SODIUM CHLORIDE 0.9 % IV SOLN
Freq: Once | INTRAVENOUS | Status: AC
  Administered 2014-10-06: 14:00:00 via INTRAVENOUS

## 2014-10-06 MED ORDER — SODIUM CHLORIDE 0.9 % IV SOLN
510.0000 mg | Freq: Once | INTRAVENOUS | Status: AC
  Administered 2014-10-06: 510 mg via INTRAVENOUS
  Filled 2014-10-06: qty 17

## 2014-10-06 NOTE — Patient Instructions (Signed)

## 2014-10-08 ENCOUNTER — Encounter: Payer: Self-pay | Admitting: Internal Medicine

## 2014-10-08 ENCOUNTER — Ambulatory Visit: Payer: BLUE CROSS/BLUE SHIELD

## 2014-10-08 ENCOUNTER — Ambulatory Visit (AMBULATORY_SURGERY_CENTER): Payer: BLUE CROSS/BLUE SHIELD | Admitting: Internal Medicine

## 2014-10-08 VITALS — BP 156/96 | HR 57 | Temp 95.7°F | Resp 32 | Ht 66.0 in | Wt 160.0 lb

## 2014-10-08 DIAGNOSIS — D123 Benign neoplasm of transverse colon: Secondary | ICD-10-CM

## 2014-10-08 DIAGNOSIS — D509 Iron deficiency anemia, unspecified: Secondary | ICD-10-CM

## 2014-10-08 MED ORDER — SODIUM CHLORIDE 0.9 % IV SOLN: 500.0000 mL | INTRAVENOUS | Status: DC

## 2014-10-08 NOTE — Progress Notes (Signed)
Dr. Hilarie Fredrickson just printed cori reports and  in to the recovery room to speak with the pt and her husband about finding from egd and colonoscopy. maw

## 2014-10-08 NOTE — Op Note (Signed)
[place]  [address]. [place], [id number]   COLONOSCOPY PROCEDURE REPORT  PATIENT: Alexandra, Stephens  MR#: [id number] BIRTHDATE: [birth year] , 58  yrs. old GENDER: female ENDOSCOPIST: Jerene Bears, MD PROCEDURE DATE:  10/08/2014 PROCEDURE:   Colonoscopy with snare polypectomy First Screening Colonoscopy - Avg.  risk and is 50 yrs.  old or older - No.  Prior Negative Screening - Now for repeat screening. N/A  History of Adenoma - Now for follow-up colonoscopy & has been > or = to 3 yrs.  N/A  Polyps Removed Today? Yes. ASA CLASS:   Class II INDICATIONS:iron deficiency anemia. MEDICATIONS: Monitored anesthesia care, Propofol 200 mg IV, and Residual sedation present  DESCRIPTION OF PROCEDURE:   After the risks benefits and alternatives of the procedure were thoroughly explained, informed consent was obtained.  The digital rectal exam revealed no rectal mass.   The LB PCF Q180 [id number]  endoscope was introduced through the anus and advanced to the cecum, which was identified by both the appendix and ileocecal valve. No adverse events experienced. The quality of the prep was good, using MoviPrep  The instrument was then slowly withdrawn as the colon was fully examined.   COLON FINDINGS: A sessile polyp measuring 8 mm in size was found in the transverse colon.  A polypectomy was performed with a cold snare.  The resection was complete, the polyp tissue was completely retrieved and sent to histology.   Melanosis coli was found throughout the entire examined colon.  Retroflexed views revealed no abnormalities. The time to cecum=5 minutes 14 seconds. Withdrawal time=10 minutes 01 seconds.  The scope was withdrawn and the procedure completed. COMPLICATIONS: There were no immediate complications.  ENDOSCOPIC IMPRESSION: 1.   Sessile polyp was found in the transverse colon; polypectomy was performed with a cold snare 2.   Melanosis coli was found throughout the entire  examined colon  RECOMMENDATIONS: 1.  Avoid all NSAIDS for the next 2 weeks. 2.  Await pathology results 3.  If the polyp removed today is proven to be an adenomatous (pre-cancerous) polyp, you will need a repeat colonoscopy in 5 years.  Otherwise you should continue to follow colorectal cancer screening guidelines for "routine risk" patients with colonoscopy in 10 years.  You will receive a letter within 1-2 weeks with the results of your biopsy as well as final recommendations.  Please call my office if you have not received a letter after 3 weeks.  eSigned:  Jerene Bears, MD 10/08/2014 9:32 AM   cc: the patient, Eddie Dibbles settle, MD

## 2014-10-08 NOTE — Progress Notes (Signed)
No problems noted in the recovery room. maw 

## 2014-10-08 NOTE — Progress Notes (Signed)
Called to room to assist during endoscopic procedure.  Patient ID and intended procedure confirmed with present staff. Received instructions for my participation in the procedure from the performing physician.  

## 2014-10-08 NOTE — Progress Notes (Signed)
A/ox3, pleased with MAC, report to RN 

## 2014-10-08 NOTE — Patient Instructions (Addendum)
YOU HAD AN ENDOSCOPIC PROCEDURE TODAY AT [facility]:   Refer to the procedure report that was given to you for any specific questions about what was found during the examination.  If the procedure report does not answer your questions, please call your gastroenterologist to clarify.  If you requested that your care partner not be given the details of your procedure findings, then the procedure report has been included in a sealed envelope for you to review at your convenience later.  YOU SHOULD EXPECT: Some feelings of bloating in the abdomen. Passage of more gas than usual.  Walking can help get rid of the air that was put into your GI tract during the procedure and reduce the bloating. If you had a lower endoscopy (such as a colonoscopy or flexible sigmoidoscopy) you may notice spotting of blood in your stool or on the toilet paper. If you underwent a bowel prep for your procedure, you may not have a normal bowel movement for a few days.  Please Note:  You might notice some irritation and congestion in your nose or some drainage.  This is from the oxygen used during your procedure.  There is no need for concern and it should clear up in a day or so.  SYMPTOMS TO REPORT IMMEDIATELY:   Following lower endoscopy (colonoscopy or flexible sigmoidoscopy):  Excessive amounts of blood in the stool  Significant tenderness or worsening of abdominal pains  Swelling of the abdomen that is new, acute  Fever of 100F or higher   Following upper endoscopy (EGD)  Vomiting of blood or coffee ground material  New chest pain or pain under the shoulder blades  Painful or persistently difficult swallowing  New shortness of breath  Fever of 100F or higher  Black, tarry-looking stools  A gastroenterologist can be reached at any hour by calling [phone].   DIET: Your first meal following the procedure should be a small meal and then it is ok to progress to your normal diet. Heavy or  fried foods are harder to digest and may make you feel nauseous or bloated.  Likewise, meals heavy in dairy and vegetables can increase bloating.  Drink plenty of fluids but you should avoid alcoholic beverages for 24 hours.  ACTIVITY:  You should plan to take it easy for the rest of today and you should NOT DRIVE or use heavy machinery until tomorrow (because of the sedation medicines used during the test).    FOLLOW UP: Our staff will call the number listed on your records the next business day following your procedure to check on you and address any questions or concerns that you may have regarding the information given to you following your procedure. If we do not reach you, we will leave a message.  However, if you are feeling well and you are not experiencing any problems, there is no need to return our call.  We will assume that you have returned to your regular daily activities without incident.  If any biopsies were taken you will be contacted by phone or by letter within the next 1-3 weeks.  Please call us at [phone] if you have not heard about the biopsies in 3 weeks.    SIGNATURES/CONFIDENTIALITY: You and/or your care partner have signed paperwork which will be entered into your electronic medical record.  These signatures attest to the fact that that the information above on your After Visit Summary has been reviewed and is understood.  Full  responsibility of the confidentiality of this discharge information lies with you and/or your care-partner.     Handout was given to your care partner on polyps. You might notice some irritation in your nose or drainage.  This may cause feelings of congestion.  This is from the oxygen, which can be drying.  This is no cause for concern; this should clear up in a few days.  You may resume your current medications today except avoid all NSAIDS for next 2 weeks. Await biopsy results. Please call if any questions or concerns.

## 2014-10-08 NOTE — Op Note (Signed)
[place]  [address]. [place], [id number]   ENDOSCOPY PROCEDURE REPORT  PATIENT: Sinclair, Arrazola  MR#: [id number] BIRTHDATE: February 08, 1956 , 58  yrs. old GENDER: female ENDOSCOPIST: Jerene Bears, MD PROCEDURE DATE:  10/08/2014 PROCEDURE:  EGD w/ biopsy ASA CLASS:     Class II INDICATIONS:  iron deficiency anemia. MEDICATIONS: Monitored anesthesia care and Propofol 150 mg IV TOPICAL ANESTHETIC: none  DESCRIPTION OF PROCEDURE: After the risks benefits and alternatives of the procedure were thoroughly explained, informed consent was obtained.  The LB [id number] endoscope was introduced through the mouth and advanced to the second portion of the duodenum , Without limitations.  The instrument was slowly withdrawn as the mucosa was fully examined.   ESOPHAGUS: The mucosa of the esophagus appeared normal.  STOMACH: The mucosa of the stomach appeared normal.  DUODENUM: The duodenal mucosa showed no abnormalities in the bulb and 2nd part of the duodenum.  Cold forcep biopsies were taken in the second portion.  Retroflexed views revealed no abnormalities. The scope was then withdrawn from the patient and the procedure completed.  COMPLICATIONS: There were no immediate complications.  ENDOSCOPIC IMPRESSION: 1.   The mucosa of the esophagus appeared normal 2.   The mucosa of the stomach appeared normal 3.   The duodenal mucosa showed no abnormalities in the bulb and 2nd part of the duodenum cold forcep biopsies were taken in the second portion  RECOMMENDATIONS: 1.  Await biopsy results 2.  Proceed with a Colonoscopy.  eSigned:  Jerene Bears, MD 10/08/2014 9:29 AM   CC:The Patient, Pat Kocher, MD

## 2014-10-09 ENCOUNTER — Telehealth: Payer: Self-pay | Admitting: *Deleted

## 2014-10-09 NOTE — Telephone Encounter (Signed)
  Follow up Call-  Call back number 10/08/2014  Post procedure Call Back phone  # [phone]  Permission to leave phone message Yes     Patient questions:Do you have a fever, pain , or abdominal swelling?  No but Having "spasms" this am not constant, she feels like its gas pains. She feels fine then this spasm hits and then goes away    Pain Score  6 * when she has a spasm but otherwise no pain at all   Have you tolerated food without any problems? Yes.    Have you been able to return to your normal activities? Yes.    Do you have any questions about your discharge instructions: Diet   No. Medications  No. Follow up visit  No.  Do you have questions or concerns about your Care? No.  Actions: * If pain score is 4 or above: Date 10-09-2014 at Time 0742.    Pt states she feels fine but she is having some "spasms" like gas pains this am . She has eaten with no problems, she has no bleeding, no fever, and she thinks its just gas pains moving  through her colon. i instructed her to drink warm fluids and walk. This will help move the air through the colon.  Told her she can take some OTC gas ex or gas relief medicines. She needs to call if the pain worsens, if she has bleeding, if she develops fever or if her symptoms change or worsen. Pt verbalized understanding of these instructions.    Lenard Galloway RN

## 2014-10-13 ENCOUNTER — Encounter: Payer: Self-pay | Admitting: Internal Medicine

## 2014-10-16 ENCOUNTER — Other Ambulatory Visit: Payer: Self-pay

## 2014-10-16 ENCOUNTER — Telehealth: Payer: Self-pay | Admitting: Internal Medicine

## 2014-10-16 ENCOUNTER — Telehealth: Payer: Self-pay

## 2014-10-16 DIAGNOSIS — D509 Iron deficiency anemia, unspecified: Secondary | ICD-10-CM

## 2014-10-16 NOTE — Telephone Encounter (Signed)
-----   Message from Jerene Bears, MD sent at 10/13/2014  9:33 PM EST ----- Dag Please arrange VCE for iron def. ECL neg Thanks [employer]

## 2014-10-16 NOTE — Telephone Encounter (Signed)
Pt scheduled for capsule endo 10/27/14@8am , pt aware of appt. Prep instructions discussed and reviewed over the phone with pt.

## 2014-10-16 NOTE — Telephone Encounter (Signed)
Spoke with pt and she is aware.

## 2014-10-16 NOTE — Telephone Encounter (Signed)
Trial of Florastor 250 mg BID x 2 weeks to see if this helps with nausea Continue RX laxative to avoid constipation Call if not improved

## 2014-10-16 NOTE — Telephone Encounter (Signed)
Pt states that since her ECL last week she has been having lots of gas and nausea. States she took some natural anti-gas med and it did not help. Pt has not actually gotten sick but feels nauseated. Please advise.

## 2014-10-27 ENCOUNTER — Ambulatory Visit (INDEPENDENT_AMBULATORY_CARE_PROVIDER_SITE_OTHER): Payer: BLUE CROSS/BLUE SHIELD | Admitting: Internal Medicine

## 2014-10-27 DIAGNOSIS — D509 Iron deficiency anemia, unspecified: Secondary | ICD-10-CM

## 2014-10-27 NOTE — Progress Notes (Signed)
Patient here for capsule endoscopy. Tolerated procedure. Verbalizes understanding of written and verbal instructions. Pt completed prep as instructed.  Capsule [id number] Exp:  12/2015

## 2014-11-20 ENCOUNTER — Encounter: Payer: Self-pay | Admitting: Internal Medicine

## 2014-11-28 ENCOUNTER — Encounter: Payer: Self-pay | Admitting: Neurology

## 2014-11-28 ENCOUNTER — Ambulatory Visit (INDEPENDENT_AMBULATORY_CARE_PROVIDER_SITE_OTHER): Payer: BLUE CROSS/BLUE SHIELD | Admitting: Neurology

## 2014-11-28 VITALS — BP 130/78 | HR 56 | Ht 66.0 in | Wt 162.0 lb

## 2014-11-28 DIAGNOSIS — R200 Anesthesia of skin: Secondary | ICD-10-CM

## 2014-11-28 DIAGNOSIS — R269 Unspecified abnormalities of gait and mobility: Secondary | ICD-10-CM | POA: Diagnosis not present

## 2014-11-28 NOTE — Progress Notes (Signed)
PATIENT: [name] DOB: Dec 16, 1955  HISTORICAL  JALYIAH SHELLEY is a 59 yo RH female, referred by her PCP Dr. Sherrlyn Hock for evaluation of unsteady gait, left facial numbness,    She has past medical history of migraine, depression anxiety.  In November 25 2014 she woke up from overnight sleep, feeling well, but when she tried to get up, she felt unsteady gait, veered to her left side, lasting for 1 hour, she also noticed severe left-sided pounding headaches, with left facial numbness,  She was evaluated by her primary care the same day, CAT scan of the brain was normal, I have personally reviewed the film  Laboratory evaluation showed normal CBC, CMP, elevated LDL 183, cholesterol 278  She also had normal ultrasound of carotid artery,  She reported long-standing history of migraine, much improved since she one time protein free diet since 2009.   REVIEW OF SYSTEMS: Full 14 system review of systems performed and notable only for fatigue, ringing ears, rash, itching, eye pain, snoring, constipation, anemia, feeling hot, feeling cold, increased thirst, joint pain, cramps, allergy, runny nose, skin sensitivity, memory loss, confusion, headaches, numbness, passing out, depression, anxiety, not enough sleep, decreased energy, change in appetite, racing source.  ALLERGIES: Allergies  Allergen Reactions  . Aspirin Nausea Only    HOME MEDICATIONS: Current Outpatient Prescriptions  Medication Sig Dispense Refill  . clonazePAM (KLONOPIN) 0.5 MG tablet Take 1 tablet (0.5 mg total) by mouth 2 (two) times daily as needed. 180 tablet 2  . dicyclomine (BENTYL) 20 MG tablet Take 1 tablet (20 mg total) by mouth 2 (two) times daily. 60 tablet 1  . escitalopram (LEXAPRO) 20 MG tablet Take 20 mg by mouth daily.      . hyoscyamine (LEVSIN, ANASPAZ) 0.125 MG tablet Take 1 tablet (0.125 mg total) by mouth as needed. 180 tablet 3  . lubiprostone (AMITIZA) 24 MCG capsule Take 1 capsule (24 mcg total) by  mouth 2 (two) times daily with a meal. 60 capsule 1  . meloxicam (MOBIC) 15 MG tablet Take 1 tablet (15 mg total) by mouth daily as needed for pain (take with food.). 60 tablet 1  . ondansetron (ZOFRAN ODT) 4 MG disintegrating tablet Take 1 tablet (4 mg total) by mouth every 8 (eight) hours as needed for nausea or vomiting. 30 tablet 0  . pantoprazole (PROTONIX) 40 MG tablet Take 1 tablet (40 mg total) by mouth 2 (two) times daily. 180 tablet 1     PAST MEDICAL HISTORY: Past Medical History  Diagnosis Date  . Bladder disease   . Syncope   . Hepatic cyst   . Chronic interstitial cystitis   . Depressive disorder, not elsewhere classified   . Esophageal reflux   . Unspecified constipation   . Irritable bowel syndrome   . Anemia   . Hemorrhoids   . Pulmonary nodules   . Fundic gland polyps of stomach, benign   . GERD (gastroesophageal reflux disease)   . Allergy     SEASONAL  . Anxiety   . Arthritis     BILATERAL HANDS,TOES ON RT. FOOT  . Hyperlipidemia   . Unsteady gait     Lips and chin  . Numbness and tingling     PAST SURGICAL HISTORY: Past Surgical History  Procedure Laterality Date  . Oophorectomy    . Cystectomy    . Milk gland removal    . Vaginal hysterectomy    . Laparoscopy    . Esophageal manometry  N/A 10/22/2012    Procedure: ESOPHAGEAL MANOMETRY (EM);  Surgeon: Sable Feil, MD;  Location: [facility];  Service: Endoscopy;  Laterality: N/A;  . Esophagogastroduodenoscopy  10/08/2010  . Colonoscopy  10/08/2010    Normal--multiple   . Dilation and curettage of uterus      FAMILY HISTORY: Family History  Problem Relation Age of Onset  . Heart attack Mother   . Bladder Cancer Mother   . Heart disease Mother   . Pancreatic cancer Paternal Grandfather   . Diabetes Paternal Grandmother   . Stroke Paternal Grandmother   . Colon cancer Neg Hx   . Bladder Cancer Father     SOCIAL HISTORY:  History   Social History  . Marital Status: Married     Spouse Name: N/A  . Number of Children: 3  . Years of Education: 14   Occupational History  . clerk of social services    Social History Main Topics  . Smoking status: Former Smoker    Types: Cigarettes    Quit date: 08/09/1987  . Smokeless tobacco: Never Used  . Alcohol Use: 0.0 oz/week    0 Standard drinks or equivalent per week     Comment: Occasionally  . Drug Use: No  . Sexual Activity: Not on file   Other Topics Concern  . Not on file   Social History Narrative   Lives at home with husband.   Right-handed.    1 cup caffeine per day.     PHYSICAL EXAM   Filed Vitals:   11/28/14 0817  BP: 130/78  Pulse: 56  Height: '5\' 6"'  (1.676 m)  Weight: 162 lb (73.483 kg)    Not recorded      Body mass index is 26.16 kg/(m^2).  PHYSICAL EXAMNIATION:  Gen: NAD, conversant, well nourised, obese, well groomed                     Cardiovascular: Regular rate rhythm, no peripheral edema, warm, nontender. Eyes: Conjunctivae clear without exudates or hemorrhage Neck: Supple, no carotid bruise. Pulmonary: Clear to auscultation bilaterally   NEUROLOGICAL EXAM:  MENTAL STATUS: Speech:    Speech is normal; fluent and spontaneous with normal comprehension.  Cognition:    The patient is oriented to person, place, and time;     recent and remote memory intact;     language fluent;     normal attention, concentration,     fund of knowledge.  CRANIAL NERVES: CN II: Visual fields are full to confrontation. Fundoscopic exam is normal with sharp discs and no vascular changes. Venous pulsations are present bilaterally. Pupils are 4 mm and briskly reactive to light. Visual acuity is 20/20 bilaterally. CN III, IV, VI: extraocular movement are normal. No ptosis. CN V: Facial sensation is intact to pinprick in all 3 divisions bilaterally. Corneal responses are intact.  CN VII: Face is symmetric with normal eye closure and smile. CN VIII: Hearing is normal to rubbing fingers CN IX,  X: Palate elevates symmetrically. Phonation is normal. CN XI: Head turning and shoulder shrug are intact CN XII: Tongue is midline with normal movements and no atrophy.  MOTOR: There is no pronator drift of out-stretched arms. Muscle bulk and tone are normal. Muscle strength is normal.   Shoulder abduction Shoulder external rotation Elbow flexion Elbow extension Wrist flexion Wrist extension Finger abduction Hip flexion Knee flexion Knee extension Ankle dorsi flexion Ankle plantar flexion  R '5 5 5 5 5 5 5 ' 5  '5 5 5 5  ' L '5 5 5 5 5 5 5 5 5 5 5 5    ' REFLEXES: Reflexes are 2+ and symmetric at the biceps, triceps, knees, and ankles. Plantar responses are flexor.  SENSORY: Light touch, pinprick, position sense, and vibration sense are intact in fingers and toes.  COORDINATION: Rapid alternating movements and fine finger movements are intact. There is no dysmetria on finger-to-nose and heel-knee-shin. There are no abnormal or extraneous movements.   GAIT/STANCE: Posture is normal. Gait is steady with normal steps, base, arm swing, and turning. Heel and toe walking are normal. Tandem gait is normal.  Romberg is absent.   DIAGNOSTIC DATA (LABS, IMAGING, TESTING) - I reviewed patient records, labs, notes, testing and imaging myself where available.  Lab Results  Component Value Date   WBC 6.4 08/25/2014   HGB 11.3* 08/25/2014   HCT 36.2 08/25/2014   MCV 78.4 08/25/2014   PLT 262.0 08/25/2014      Component Value Date/Time   NA 137 04/16/2014 1037   K 4.8 04/16/2014 1037   CL 100 04/16/2014 1037   CO2 30 04/16/2014 1037   GLUCOSE 88 04/16/2014 1037   BUN 9 04/16/2014 1037   CREATININE 0.7 04/16/2014 1037   CALCIUM 10.7* 04/16/2014 1037   PROT 7.5 04/16/2014 1037   ALBUMIN 4.0 04/16/2014 1037   AST 22 04/16/2014 1037   ALT 26 04/16/2014 1037   ALKPHOS 84 04/16/2014 1037   BILITOT 0.6 04/16/2014 1037   GFRNONAA 92.12 10/06/2009 1007   GFRAA  10/29/2008 0614    >60        The  eGFR has been calculated using the MDRD equation. This calculation has not been validated in all clinical situations. eGFR's persistently <60 mL/min signify possible Chronic Kidney Disease.   No results found for: CHOL, HDL, LDLCALC, LDLDIRECT, TRIG, CHOLHDL No results found for: HGBA1C Lab Results  Component Value Date   [id number] 612 04/16/2014   Lab Results  Component Value Date   TSH 1.45 04/03/2013      ASSESSMENT AND PLAN  RICARDA ATAYDE is a 59 y.o. female  with past medical history of depression anxiety, hyperlipidemia, chronic migraine, presenting with one episode of severe left-sided headaches, with associated unsteady gait, severe to the left side, lasting for a few hours, normal examination now, normal CAT scan of the brain, ultrasound of carotid artery  1, differentiation diagnosis includes complicated migraine, need to rule out brainstem stroke 2. MRI of brain 3, aspirin daily 4. Return to clinic in one month  Marcial Pacas, M.D. Ph.D.  [facility] [address], [place], [place] [address] Ph: [phone] Fax: [phone]

## 2014-12-01 ENCOUNTER — Telehealth: Payer: Self-pay | Admitting: Internal Medicine

## 2014-12-01 MED ORDER — LUBIPROSTONE 24 MCG PO CAPS: 24.0000 ug | ORAL_CAPSULE | Freq: Two times a day (BID) | ORAL | Status: DC

## 2014-12-01 NOTE — Telephone Encounter (Signed)
Rx sent 

## 2014-12-03 ENCOUNTER — Telehealth: Payer: Self-pay | Admitting: Neurology

## 2014-12-03 NOTE — Telephone Encounter (Signed)
Per Dr. Krista Blue, work her in for a trigger point injection in the am.  Appt scheduled - pt aware.

## 2014-12-03 NOTE — Telephone Encounter (Signed)
Patient called and stated she was having a severe migraine along with ringing in her ears. She says the pain is unbearable and would like to speak with someone about her options. Please call and advise.

## 2014-12-04 ENCOUNTER — Ambulatory Visit (INDEPENDENT_AMBULATORY_CARE_PROVIDER_SITE_OTHER): Payer: BLUE CROSS/BLUE SHIELD | Admitting: Neurology

## 2014-12-04 ENCOUNTER — Encounter: Payer: Self-pay | Admitting: *Deleted

## 2014-12-04 VITALS — BP 131/77 | HR 58 | Ht 66.0 in | Wt 162.0 lb

## 2014-12-04 DIAGNOSIS — G43009 Migraine without aura, not intractable, without status migrainosus: Secondary | ICD-10-CM

## 2014-12-04 NOTE — Progress Notes (Signed)
Bupivicaine Lot [id number], Exp 07/2016 //[email or web address]

## 2014-12-04 NOTE — Progress Notes (Signed)
PATIENT: [name] DOB: 14-Feb-1956  HISTORICAL  [name] is a 59 yo RH female, referred by her PCP Dr. Sherrlyn Hock for evaluation of unsteady gait, left facial numbness,    She has past medical history of migraine, depression anxiety.  In November 25 2014 she woke up from overnight sleep, feeling well, but when she tried to get up, she felt unsteady gait, veered to her left side, lasting for 1 hour, she also noticed severe left-sided pounding headaches, with left facial numbness,  She was evaluated by her primary care the same day, CAT scan of the brain was normal, I have personally reviewed the film  Laboratory evaluation showed normal CBC, CMP, elevated LDL 183, cholesterol 278  She also had normal ultrasound of carotid artery,  She reported long-standing history of migraine, much improved since she one time protein free diet since 2009.  UPDATE April 28th 2016:  She has severe headaches since April 27th 2016, pounding, ringing in left ear, she has taken Tylenol,tramadol, Oxycodone, without helping,  She came in today with at this 7 out of 10 pounding headaches,  REVIEW OF SYSTEMS: Full 14 system review of systems performed and notable only for fatigue, ringing ears, rash, itching, eye pain, snoring, constipation, anemia, feeling hot, feeling cold, increased thirst, joint pain, cramps, allergy, runny nose, skin sensitivity, memory loss, confusion, headaches, numbness, passing out, depression, anxiety, not enough sleep, decreased energy, change in appetite, racing thoughts.  ALLERGIES: Allergies  Allergen Reactions  . Aspirin Nausea Only    HOME MEDICATIONS: Current Outpatient Prescriptions  Medication Sig Dispense Refill  . clonazePAM (KLONOPIN) 0.5 MG tablet Take 1 tablet (0.5 mg total) by mouth 2 (two) times daily as needed. 180 tablet 2  . dicyclomine (BENTYL) 20 MG tablet Take 1 tablet (20 mg total) by mouth 2 (two) times daily. 60 tablet 1  . escitalopram (LEXAPRO)  20 MG tablet Take 20 mg by mouth daily.      . hyoscyamine (LEVSIN, ANASPAZ) 0.125 MG tablet Take 1 tablet (0.125 mg total) by mouth as needed. 180 tablet 3  . lubiprostone (AMITIZA) 24 MCG capsule Take 1 capsule (24 mcg total) by mouth 2 (two) times daily with a meal. 60 capsule 1  . meloxicam (MOBIC) 15 MG tablet Take 1 tablet (15 mg total) by mouth daily as needed for pain (take with food.). 60 tablet 1  . ondansetron (ZOFRAN ODT) 4 MG disintegrating tablet Take 1 tablet (4 mg total) by mouth every 8 (eight) hours as needed for nausea or vomiting. 30 tablet 0  . pantoprazole (PROTONIX) 40 MG tablet Take 1 tablet (40 mg total) by mouth 2 (two) times daily. 180 tablet 1     PAST MEDICAL HISTORY: Past Medical History  Diagnosis Date  . Bladder disease   . Syncope   . Hepatic cyst   . Chronic interstitial cystitis   . Depressive disorder, not elsewhere classified   . Esophageal reflux   . Unspecified constipation   . Irritable bowel syndrome   . Anemia   . Hemorrhoids   . Pulmonary nodules   . Fundic gland polyps of stomach, benign   . GERD (gastroesophageal reflux disease)   . Allergy     SEASONAL  . Anxiety   . Arthritis     BILATERAL HANDS,TOES ON RT. FOOT  . Hyperlipidemia   . Unsteady gait     Lips and chin  . Numbness and tingling     PAST SURGICAL HISTORY:  Past Surgical History  Procedure Laterality Date  . Oophorectomy    . Cystectomy    . Milk gland removal    . Vaginal hysterectomy    . Laparoscopy    . Esophageal manometry N/A 10/22/2012    Procedure: ESOPHAGEAL MANOMETRY (EM);  Surgeon: Sable Feil, MD;  Location: [facility];  Service: Endoscopy;  Laterality: N/A;  . Esophagogastroduodenoscopy  10/08/2010  . Colonoscopy  10/08/2010    Normal--multiple   . Dilation and curettage of uterus      FAMILY HISTORY: Family History  Problem Relation Age of Onset  . Heart attack Mother   . Bladder Cancer Mother   . Heart disease Mother   . Pancreatic  cancer Paternal Grandfather   . Diabetes Paternal Grandmother   . Stroke Paternal Grandmother   . Colon cancer Neg Hx   . Bladder Cancer Father     SOCIAL HISTORY:  History   Social History  . Marital Status: Married    Spouse Name: N/A  . Number of Children: 3  . Years of Education: 14   Occupational History  . clerk of social services    Social History Main Topics  . Smoking status: Former Smoker    Types: Cigarettes    Quit date: 08/09/1987  . Smokeless tobacco: Never Used  . Alcohol Use: 0.0 oz/week    0 Standard drinks or equivalent per week     Comment: Occasionally  . Drug Use: No  . Sexual Activity: Not on file   Other Topics Concern  . Not on file   Social History Narrative   Lives at home with husband.   Right-handed.    1 cup caffeine per day.     PHYSICAL EXAM   There were no vitals filed for this visit.  Not recorded      There is no weight on file to calculate BMI.  PHYSICAL EXAMNIATION:  Gen: NAD, conversant, well nourised, obese, well groomed                     Cardiovascular: Regular rate rhythm, no peripheral edema, warm, nontender. Eyes: Conjunctivae clear without exudates or hemorrhage Neck: Supple, no carotid bruise. Pulmonary: Clear to auscultation bilaterally   NEUROLOGICAL EXAM:  MENTAL STATUS: In moderate distress  CRANIAL NERVES: CN II: Visual fields are full to confrontation. Fundoscopic exam is normal with sharp discs and no vascular changes. MOTOR: There is no pronator drift of out-stretched arms. Muscle bulk and tone are normal. Muscle strength is normal.   There are no abnormal or extraneous movements.   GAIT/STANCE: Posture is normal. Gait is steady with normal steps,  DIAGNOSTIC DATA (LABS, IMAGING, TESTING) - I reviewed patient records, labs, notes, testing and imaging myself where available.  Lab Results  Component Value Date   WBC 6.4 08/25/2014   HGB 11.3* 08/25/2014   HCT 36.2 08/25/2014   MCV 78.4  08/25/2014   PLT 262.0 08/25/2014      Component Value Date/Time   NA 137 04/16/2014 1037   K 4.8 04/16/2014 1037   CL 100 04/16/2014 1037   CO2 30 04/16/2014 1037   GLUCOSE 88 04/16/2014 1037   BUN 9 04/16/2014 1037   CREATININE 0.7 04/16/2014 1037   CALCIUM 10.7* 04/16/2014 1037   PROT 7.5 04/16/2014 1037   ALBUMIN 4.0 04/16/2014 1037   AST 22 04/16/2014 1037   ALT 26 04/16/2014 1037   ALKPHOS 84 04/16/2014 1037   BILITOT 0.6 04/16/2014 1037  GFRNONAA 92.12 10/06/2009 1007   GFRAA  10/29/2008 0614    >60        The eGFR has been calculated using the MDRD equation. This calculation has not been validated in all clinical situations. eGFR's persistently <60 mL/min signify possible Chronic Kidney Disease.   No results found for: CHOL, HDL, LDLCALC, LDLDIRECT, TRIG, CHOLHDL No results found for: HGBA1C Lab Results  Component Value Date   [id number] 505 04/16/2014   Lab Results  Component Value Date   TSH 1.45 04/03/2013      ASSESSMENT AND PLAN  [name] is a 59 y.o. female  with past medical history of depression anxiety, hyperlipidemia, chronic migraine, presenting with one episode of severe left-sided headaches, with associated unsteady gait, severe to the left side, lasting for a few hours, normal examination now, normal CAT scan of the brain, ultrasound of carotid artery  1, differentiation diagnosis includes complicated migraine, need to rule out brainstem stroke 2.  She came in urgently for 2 days history of prolonged severe headaches,  Bupivicaine injection protocol for headache  Bupivacaine 0.5% was injected on the scalp bilaterally at several locations:  -On the occipital area of the head, 3 injections each side, 1 cc per injection at the midpoint between the mastoid process and the occipital protuberance. 2 other injections were done one finger breadth from the initial injection, one at a 10 o'clock position and the other at a 2 o'clock  position.  -2 injections of 0.5 cc were done in the temporal regions, 2 fingerbreadths above the tragus of the ear, with the second injection one fingerbreadth posteriorly to the first.  -2 injections were done on the brow, 1 in the medial brow and one over the supraorbital nerve notch, with 0.1 cc for each injection  -1 injection each side of 0.5 cc was done anterior to the tragus of the ear for a trigeminal ganglion injection  The patient tolerated the injections well, no complications of the procedure were noted. Injections were made with a 27-gauge needle.  [facility] number is [phone].  Patient tolerate the injection well, reported significant improvement of her headache afterwards,

## 2014-12-05 DIAGNOSIS — G43009 Migraine without aura, not intractable, without status migrainosus: Secondary | ICD-10-CM | POA: Diagnosis not present

## 2014-12-05 MED ORDER — BUPIVACAINE HCL (PF) 0.5 % IJ SOLN
10.0000 mL | Freq: Once | INTRAMUSCULAR | Status: AC
  Administered 2014-12-05: 10 mL

## 2014-12-16 ENCOUNTER — Telehealth: Payer: Self-pay | Admitting: Internal Medicine

## 2014-12-16 NOTE — Telephone Encounter (Signed)
Pt states she has been having problems with IBS, not making it to the bathroom in time. Pt states she is taking amitiza bid and at times she has accidents. Pt states when she tried to take the amitiza once a day she doesn't go to the bathroom. Pt states she forgot she sees Dr. Hilarie Fredrickson next week. States she will discuss these issues with him next week. Dr. Hilarie Fredrickson aware.

## 2014-12-20 ENCOUNTER — Other Ambulatory Visit: Payer: BLUE CROSS/BLUE SHIELD

## 2014-12-22 ENCOUNTER — Ambulatory Visit: Payer: BLUE CROSS/BLUE SHIELD | Admitting: Neurology

## 2014-12-24 ENCOUNTER — Telehealth: Payer: Self-pay | Admitting: Oncology

## 2014-12-24 ENCOUNTER — Ambulatory Visit
Admission: RE | Admit: 2014-12-24 | Discharge: 2014-12-24 | Disposition: A | Discharge status: Home / Self Care | Payer: BLUE CROSS/BLUE SHIELD | Source: Ambulatory Visit | Attending: Neurology | Admitting: Neurology

## 2014-12-24 ENCOUNTER — Ambulatory Visit (HOSPITAL_BASED_OUTPATIENT_CLINIC_OR_DEPARTMENT_OTHER): Payer: BLUE CROSS/BLUE SHIELD | Admitting: Oncology

## 2014-12-24 ENCOUNTER — Ambulatory Visit (INDEPENDENT_AMBULATORY_CARE_PROVIDER_SITE_OTHER): Payer: BLUE CROSS/BLUE SHIELD | Admitting: Internal Medicine

## 2014-12-24 ENCOUNTER — Encounter: Payer: Self-pay | Admitting: Internal Medicine

## 2014-12-24 ENCOUNTER — Other Ambulatory Visit (HOSPITAL_BASED_OUTPATIENT_CLINIC_OR_DEPARTMENT_OTHER): Payer: BLUE CROSS/BLUE SHIELD

## 2014-12-24 VITALS — BP 124/61 | HR 71 | Temp 98.1°F | Resp 18

## 2014-12-24 VITALS — BP 108/70 | HR 80 | Ht 66.0 in | Wt 159.4 lb

## 2014-12-24 DIAGNOSIS — K219 Gastro-esophageal reflux disease without esophagitis: Secondary | ICD-10-CM | POA: Diagnosis not present

## 2014-12-24 DIAGNOSIS — R918 Other nonspecific abnormal finding of lung field: Secondary | ICD-10-CM

## 2014-12-24 DIAGNOSIS — R200 Anesthesia of skin: Secondary | ICD-10-CM

## 2014-12-24 DIAGNOSIS — R269 Unspecified abnormalities of gait and mobility: Secondary | ICD-10-CM | POA: Diagnosis not present

## 2014-12-24 DIAGNOSIS — D509 Iron deficiency anemia, unspecified: Secondary | ICD-10-CM | POA: Diagnosis not present

## 2014-12-24 DIAGNOSIS — K589 Irritable bowel syndrome without diarrhea: Secondary | ICD-10-CM

## 2014-12-24 DIAGNOSIS — K5900 Constipation, unspecified: Secondary | ICD-10-CM

## 2014-12-24 LAB — CBC WITH DIFFERENTIAL/PLATELET
BASO%: 1 % (ref 0.0–2.0)
Basophils Absolute: 0.1 10*3/uL (ref 0.0–0.1)
EOS%: 2.2 % (ref 0.0–7.0)
Eosinophils Absolute: 0.1 10*3/uL (ref 0.0–0.5)
HCT: 46.3 % (ref 34.8–46.6)
HGB: 15.3 g/dL (ref 11.6–15.9)
LYMPH#: 2 10*3/uL (ref 0.9–3.3)
LYMPH%: 37.2 % (ref 14.0–49.7)
MCH: 29.5 pg (ref 25.1–34.0)
MCHC: 33.2 g/dL (ref 31.5–36.0)
MCV: 89.1 fL (ref 79.5–101.0)
MONO#: 0.4 10*3/uL (ref 0.1–0.9)
MONO%: 7.1 % (ref 0.0–14.0)
NEUT%: 52.5 % (ref 38.4–76.8)
NEUTROS ABS: 2.7 10*3/uL (ref 1.5–6.5)
PLATELETS: 207 10*3/uL (ref 145–400)
RBC: 5.19 10*6/uL (ref 3.70–5.45)
RDW: 18.1 % — AB (ref 11.2–14.5)
WBC: 5.2 10*3/uL (ref 3.9–10.3)

## 2014-12-24 LAB — COMPREHENSIVE METABOLIC PANEL (CC13)
ALK PHOS: 113 U/L (ref 40–150)
ALT: 17 U/L (ref 0–55)
AST: 18 U/L (ref 5–34)
Albumin: 4 g/dL (ref 3.5–5.0)
Anion Gap: 11 mEq/L (ref 3–11)
BILIRUBIN TOTAL: 0.33 mg/dL (ref 0.20–1.20)
BUN: 7.9 mg/dL (ref 7.0–26.0)
CHLORIDE: 101 meq/L (ref 98–109)
CO2: 28 mEq/L (ref 22–29)
CREATININE: 0.8 mg/dL (ref 0.6–1.1)
Calcium: 10.5 mg/dL — ABNORMAL HIGH (ref 8.4–10.4)
EGFR: 75 mL/min/{1.73_m2} — ABNORMAL LOW (ref >90)
Glucose: 80 mg/dl (ref 70–140)
Potassium: 4.4 mEq/L (ref 3.5–5.1)
Sodium: 140 mEq/L (ref 136–145)
Total Protein: 6.8 g/dL (ref 6.4–8.3)

## 2014-12-24 LAB — IRON AND TIBC CHCC
%SAT: 36 % (ref 21–57)
IRON: 112 ug/dL (ref 41–142)
TIBC: 313 ug/dL (ref 236–444)
UIBC: 200 ug/dL (ref 120–384)

## 2014-12-24 LAB — FERRITIN CHCC: Ferritin: 74 ng/ml (ref 9–269)

## 2014-12-24 MED ORDER — RANITIDINE HCL 150 MG PO TABS: 150.0000 mg | ORAL_TABLET | Freq: Two times a day (BID) | ORAL | Status: DC

## 2014-12-24 NOTE — Progress Notes (Signed)
Hematology and Oncology Follow Up Visit  NYCOLE KAWAHARA [id number] [birth year] 59 y.o. 12/24/2014 1:41 PM [name], MDSettle, Hall Busing, MD   Principle Diagnosis: 59 year old woman with iron deficiency anemia diagnosed in September 2015. She had a prior level of 30 and saturation of 6.1%. Her hemoglobin was 11.3.   Prior Therapy: IV iron in the form of Feraheme given on February 22 and February 29 of 2016 for a total of 1020 mg.  Current therapy: Oral iron replacement.  Interim History: Ms. Molloy presents today for a follow-up visit. Since the last visit, she underwent IV iron replacement and tolerated it well. She did not report any complications at this time. She did not have any infusion-related issues. Her energy did improve afterwards. She did have an episode of facial numbness, unsteady gait documented on 11/25/2014. She was evaluated by neurology currently under evaluation for possible TIA. She did have an MRI done on 12/24/2014 although the results are not ready at this time. She reports her symptoms have resolved at this time and back to baseline. She does have recurrent migraines as is her occasional headaches and presyncopal feeling. She does report occasional nausea. She able to eat well. She does not report any constitutional symptoms. She does not report any weight loss or appetite changes. She does not report any cough or hemoptysis. She does not report any genitourinary complaints. Remaining review of systems unremarkable.  Medications: I have reviewed the patient's current medications.  Current Outpatient Prescriptions  Medication Sig Dispense Refill  . aspirin EC 81 MG tablet Take 81 mg by mouth daily.    . clonazePAM (KLONOPIN) 0.5 MG tablet Take 1 tablet (0.5 mg total) by mouth 2 (two) times daily as needed. 180 tablet 2  . dicyclomine (BENTYL) 20 MG tablet Take 1 tablet (20 mg total) by mouth 2 (two) times daily. 60 tablet 1  . escitalopram (LEXAPRO) 20 MG tablet Take 20 mg  by mouth daily.      . hyoscyamine (LEVSIN, ANASPAZ) 0.125 MG tablet Take 1 tablet (0.125 mg total) by mouth as needed. 180 tablet 3  . Iron-Vitamin C (VITRON-C PO) Take 1 capsule by mouth daily.    Marland Kitchen lubiprostone (AMITIZA) 24 MCG capsule Take 1 capsule (24 mcg total) by mouth 2 (two) times daily with a meal. 60 capsule 2  . meloxicam (MOBIC) 15 MG tablet Take 1 tablet (15 mg total) by mouth daily as needed for pain (take with food.). 60 tablet 1  . ondansetron (ZOFRAN ODT) 4 MG disintegrating tablet Take 1 tablet (4 mg total) by mouth every 8 (eight) hours as needed for nausea or vomiting. 30 tablet 0  . pantoprazole (PROTONIX) 40 MG tablet Take 1 tablet (40 mg total) by mouth 2 (two) times daily. 180 tablet 1   No current facility-administered medications for this visit.     Allergies:  Allergies  Allergen Reactions  . Aspirin Nausea Only    Past Medical History, Surgical history, Social history, and Family History were reviewed and updated.   Physical Exam: Blood pressure 124/61, pulse 71, temperature 98.1 F (36.7 C), temperature source Oral, resp. rate 18, SpO2 99 %. ECOG: 0 General appearance: alert and cooperative Head: Normocephalic, without obvious abnormality Neck: no adenopathy Lymph nodes: Cervical, supraclavicular, and axillary nodes normal. Heart:regular rate and rhythm, S1, S2 normal, no murmur, click, rub or gallop Lung:chest clear, no wheezing, rales, normal symmetric air entry Abdomin: soft, non-tender, without masses or organomegaly EXT:no erythema, induration, or nodules  Lab Results: Lab Results  Component Value Date   WBC 5.2 12/24/2014   HGB 15.3 12/24/2014   HCT 46.3 12/24/2014   MCV 89.1 12/24/2014   PLT 207 12/24/2014     Chemistry      Component Value Date/Time   NA 140 12/24/2014 1247   NA 137 04/16/2014 1037   K 4.4 12/24/2014 1247   K 4.8 04/16/2014 1037   CL 100 04/16/2014 1037   CO2 28 12/24/2014 1247   CO2 30 04/16/2014 1037    BUN 7.9 12/24/2014 1247   BUN 9 04/16/2014 1037   CREATININE 0.8 12/24/2014 1247   CREATININE 0.7 04/16/2014 1037      Component Value Date/Time   CALCIUM 10.5* 12/24/2014 1247   CALCIUM 10.7* 04/16/2014 1037   ALKPHOS 113 12/24/2014 1247   ALKPHOS 84 04/16/2014 1037   AST 18 12/24/2014 1247   AST 22 04/16/2014 1037   ALT 17 12/24/2014 1247   ALT 26 04/16/2014 1037   BILITOT 0.33 12/24/2014 1247   BILITOT 0.6 04/16/2014 1037      Impression and Plan:   59 year old with the following issues:  1. Iron deficiency anemia documented by a hemoglobin of 11.3 and ferritin of 4.7. He is status post IV iron replacement with her hemoglobin perfectly normal at this time. The plan is to continue with oral iron replacement as she is doing and I will continue to monitor her counts. I'll repeat her iron studies in 4 months and replace with IV iron if needed to.  2. Facial numbness and gait disturbance: Her neurological workup is ongoing. I doubt this is has anything to do with her iron deficiency. Malignancy is less likely but certainly a possibility and I will await the MRI results to make sure that there is no evidence of malignancy then his causing this.  3. Follow-up will be in 4 months sooner if there is any need to order for neurological workup revealed any other findings.   [facility], MD 5/18/20161:41 PM

## 2014-12-24 NOTE — Telephone Encounter (Signed)
Pt confirmed labs/ov per 05/18 POF, gave pt AVS and Calendar.... KJ

## 2014-12-24 NOTE — Progress Notes (Signed)
Subjective:    Patient ID: [name], female    DOB: 19-Nov-1955, 59 y.o.   MRN: [id number]  HPI [name] is a 59 year old female past medical history of IBS with constipation, adenomatous colon polyp, GERD, interstitial cystitis, migraines, and iron deficiency anemia who seen in follow-up. She was last seen in February 2016. After this appointment she came for colonoscopy which was performed on 10/08/2014 along with endoscopy. Colonoscopy revealed an 8 mm sessile serrated adenoma which was removed by snare from the transverse colon. There was melanosis coli throughout the colon but was otherwise normal. Endoscopy was normal. Small bowel biopsies normal. Subsequent capsule endoscopy performed to evaluate IDA and this was normal on 10/27/2014. She was referred to Dr. Alen Blew and has received 2 IV iron infusions. Hemoglobin has normalized. Feeling better with increased stamina and energy levels.  She is still having issues with constipation and diarrhea. She feels that diarrhea is often triggered by diet, specifically onions and beans. She is trying to use Amitiza 24 g twice daily but is hesitant to use it twice daily while she is working over fear of urgent loose stools and even accidents. She's been using it twice on the weekends and wants during the weekday. She used the medicine twice yesterday and has had 2 normal bowel movements today. She denies abdominal pain today. No nausea or vomiting. Appetite is been good. She has been struggling with migraine headaches and had a TIA-like episode recently. This is being evaluated by neurology and she had an MRI earlier today. She's also received trigger point injections which is seem to help. She took herself off pantoprazole over fear from side effects of this medication. She's been using ranitidine once daily and having breakthrough heartburn. No dysphagia   Review of Systems As per history of present illness, otherwise negative  Current  Medications, Allergies, Past Medical History, Past Surgical History, Family History and Social History were reviewed in [employer] record.     Objective:   Physical Exam BP 108/70 mmHg  Pulse 80  Ht 5\' 6"  (1.676 m)  Wt 159 lb 6 oz (72.292 kg)  BMI 25.74 kg/m2 Constitutional: Well-developed and well-nourished. No distress. HEENT: Normocephalic and atraumatic. Oropharynx is clear and moist. No oropharyngeal exudate. Conjunctivae are normal.  No scleral icterus. Neck: Neck supple. Trachea midline. Cardiovascular: Normal rate, regular rhythm and intact distal pulses. No M/R/G Pulmonary/chest: Effort normal and breath sounds normal. No wheezing, rales or rhonchi. Abdominal: Soft, nontender, nondistended. Bowel sounds active throughout.  Extremities: no clubbing, cyanosis, or edema Neurological: Alert and oriented to person place and time. Skin: Skin is warm and dry. No rashes noted. Psychiatric: Normal mood and affect. Behavior is normal.  CBC    Component Value Date/Time   WBC 5.2 12/24/2014 1247   WBC 6.4 08/25/2014 1052   RBC 5.19 12/24/2014 1247   RBC 4.62 08/25/2014 1052   HGB 15.3 12/24/2014 1247   HGB 11.3* 08/25/2014 1052   HCT 46.3 12/24/2014 1247   HCT 36.2 08/25/2014 1052   PLT 207 12/24/2014 1247   PLT 262.0 08/25/2014 1052   MCV 89.1 12/24/2014 1247   MCV 78.4 08/25/2014 1052   MCH 29.5 12/24/2014 1247   MCHC 33.2 12/24/2014 1247   MCHC 31.2 08/25/2014 1052   RDW 18.1* 12/24/2014 1247   RDW 16.4* 08/25/2014 1052   LYMPHSABS 2.0 12/24/2014 1247   LYMPHSABS 2.4 08/25/2014 1052   MONOABS 0.4 12/24/2014 1247   MONOABS 0.7 08/25/2014 1052  EOSABS 0.1 12/24/2014 1247   EOSABS 0.0 08/25/2014 1052   BASOSABS 0.1 12/24/2014 1247   BASOSABS 0.0 08/25/2014 1052    Iron/TIBC/Ferritin/ %Sat    Component Value Date/Time   IRON 112 12/24/2014 1247   IRON 47 08/25/2014 1052   TIBC 313 12/24/2014 1247   FERRITIN 74 12/24/2014 1247   FERRITIN  4.7* 08/25/2014 1052   IRONPCTSAT 36 12/24/2014 1247   IRONPCTSAT 9.4* 08/25/2014 1052      Assessment & Plan:  59 year old female past medical history of IBS with constipation, adenomatous colon polyp, GERD, interstitial cystitis, migraines, and iron deficiency anemia who seen in follow-up.  1. IDA -- no GI source found to explain iron deficiency. Iron is been supplemented IV and iron studies have normalized as has hemoglobin. She will continue to follow with hematology for routine monitoring of hemoglobin and iron stores. Repeat IV iron as needed  2. IBS with constipation -- difficult to determine why she is having good days and then days with diarrhea and loose stools. I requested she keep a food diary. We'll have her use Amitiza 24 g twice a day alternating with once daily. On the day she uses the medication once she will use it 30 minutes before dinner. It has helped with lower abdominal cramping and pain.  3. GERD -- she prefers to try H2 blocker versus PPI. I explained that these drugs are tender 12 hour drugs and usually need twice a day dosing. Trial of Zantac 150 milligrams twice a day. She reports she may need to resume pantoprazole if heartburn cannot be completely control. If so I'm okay with 40 mg of pantoprazole once daily with ranitidine each evening on an as-needed basis for breakthrough  4. Pulmonary nodules -- indeterminate repeat chest CT recommended July 2016  Follow-up in 6 months, sooner if necessary 30 minutes spent with the patient today

## 2014-12-24 NOTE — Patient Instructions (Signed)
Please keep a food diary.  Discontinue pantoprazole.  We have sent the following medications to your pharmacy for you to pick up at your convenience: Zantac 150 mg twice daily  Please take your Amitiza 24 mcg 1 tablet twice daily alternating with 1 tablet once daily.  Please follow up with Dr Hilarie Fredrickson in 6 months.

## 2014-12-25 ENCOUNTER — Telehealth: Payer: Self-pay

## 2014-12-25 ENCOUNTER — Telehealth: Payer: Self-pay | Admitting: *Deleted

## 2014-12-25 DIAGNOSIS — R918 Other nonspecific abnormal finding of lung field: Secondary | ICD-10-CM

## 2014-12-25 NOTE — Telephone Encounter (Signed)
-----   Message from Jerene Bears, MD sent at 12/24/2014  5:25 PM EDT ----- Patient needs repeat CT chest in July 2016 to evaluate previously seen pulmonary nodules

## 2014-12-25 NOTE — Telephone Encounter (Signed)
===  View-only below this line===  ----- Message -----    From: Jerene Bears, MD    Sent: 12/24/2014   5:25 PM      To: Larina Bras, CMA  Patient needs repeat CT chest in July 2016 to evaluate previously seen pulmonary nodules

## 2014-12-25 NOTE — Telephone Encounter (Signed)
Patient has been scheduled for Chest CT on 02/16/15 @ 10:00 am at [place]. NPO 2 hours prior. Patient has been advised of time/date/location and prep for CT. She verbalizes understanding of this.

## 2014-12-25 NOTE — Telephone Encounter (Signed)
Pt was informed of normal MRI of the brain, she was also reminded of next scheduled appointment

## 2014-12-25 NOTE — Telephone Encounter (Signed)
error 

## 2014-12-31 ENCOUNTER — Ambulatory Visit: Payer: BLUE CROSS/BLUE SHIELD | Admitting: Neurology

## 2015-01-21 ENCOUNTER — Encounter: Payer: Self-pay | Admitting: Neurology

## 2015-01-21 ENCOUNTER — Ambulatory Visit (INDEPENDENT_AMBULATORY_CARE_PROVIDER_SITE_OTHER): Payer: BLUE CROSS/BLUE SHIELD | Admitting: Neurology

## 2015-01-21 VITALS — BP 139/83 | HR 75 | Ht 66.0 in | Wt 158.0 lb

## 2015-01-21 DIAGNOSIS — G43009 Migraine without aura, not intractable, without status migrainosus: Secondary | ICD-10-CM | POA: Diagnosis not present

## 2015-01-21 MED ORDER — RIZATRIPTAN BENZOATE 5 MG PO TBDP: 5.0000 mg | ORAL_TABLET | ORAL | Status: DC | PRN

## 2015-01-21 MED ORDER — NORTRIPTYLINE HCL 10 MG PO CAPS: ORAL_CAPSULE | ORAL | Status: DC

## 2015-01-21 NOTE — Progress Notes (Signed)
Chief Complaint  Patient presents with  . Migraine    She is still having daily, mild headaches and estimates four migraines per month.  She treats the migraines with Tramadol and rest.      PATIENT: [name] DOB: 1955-12-26  HISTORICAL  VINCIE LINN is a 59 yo RH female, referred by her PCP Dr. Sherrlyn Hock for evaluation of unsteady gait, left facial numbness,    She has past medical history of migraine, depression anxiety.  In November 25 2014 she woke up from overnight sleep, feeling well, but when she tried to get up, she felt unsteady gait, veered to her left side, lasting for 1 hour, she also noticed severe left-sided pounding headaches, with left facial numbness,  She was evaluated by her primary care the same day, CAT scan of the brain was normal, I have personally reviewed the film  Laboratory evaluation showed normal CBC, CMP, elevated LDL 183, cholesterol 278  She also had normal ultrasound of carotid artery,  She reported long-standing history of migraine, much improved since she one time protein free diet since 2009.  UPDATE June 15th 2016: She had nerve block for prologned severe headache in April 28th 2016 for prolonged headaches, which has been very helpful.  She had 2 bad headaches since last visit in April 28th, lasting for 2-3 days, could not functions, tramadol helps her some, but only temporarily, also caused constipation  I have personally reviewed MRI of the brain in Jan 03 2015 that was normal..  She still complains of daily headache, bilateral frontal area pressure headaches, few times each week, exacerbated into a more severe headaches, with large tinnitus, light noise smell sensitivity, lasting hours to days,  Trigger for her headaches are stress, weather change, certain food, such as onion  REVIEW OF SYSTEMS: Full 14 system review of systems performed and notable only for appetite change, chills, fatigue, ringing ears, eye discharge, eye itching, eye  redness, light sensitivity, cold intolerance, excessive thirst, constipation, nausea, daytime sleepiness, dizziness, headaches, numbness, agitation, confusion, depression anxiety.   ALLERGIES: Allergies  Allergen Reactions  . Aspirin Nausea Only    HOME MEDICATIONS: Current Outpatient Prescriptions  Medication Sig Dispense Refill  . clonazePAM (KLONOPIN) 0.5 MG tablet Take 1 tablet (0.5 mg total) by mouth 2 (two) times daily as needed. 180 tablet 2  . dicyclomine (BENTYL) 20 MG tablet Take 1 tablet (20 mg total) by mouth 2 (two) times daily. 60 tablet 1  . escitalopram (LEXAPRO) 20 MG tablet Take 20 mg by mouth daily.      . hyoscyamine (LEVSIN, ANASPAZ) 0.125 MG tablet Take 1 tablet (0.125 mg total) by mouth as needed. 180 tablet 3  . lubiprostone (AMITIZA) 24 MCG capsule Take 1 capsule (24 mcg total) by mouth 2 (two) times daily with a meal. 60 capsule 1  . meloxicam (MOBIC) 15 MG tablet Take 1 tablet (15 mg total) by mouth daily as needed for pain (take with food.). 60 tablet 1  . ondansetron (ZOFRAN ODT) 4 MG disintegrating tablet Take 1 tablet (4 mg total) by mouth every 8 (eight) hours as needed for nausea or vomiting. 30 tablet 0  . pantoprazole (PROTONIX) 40 MG tablet Take 1 tablet (40 mg total) by mouth 2 (two) times daily. 180 tablet 1     PAST MEDICAL HISTORY: Past Medical History  Diagnosis Date  . Bladder disease   . Syncope   . Hepatic cyst   . Chronic interstitial cystitis   . Depressive  disorder, not elsewhere classified   . Esophageal reflux   . Unspecified constipation   . Irritable bowel syndrome   . Anemia   . Hemorrhoids   . Pulmonary nodules   . Fundic gland polyps of stomach, benign   . GERD (gastroesophageal reflux disease)   . Allergy     SEASONAL  . Anxiety   . Arthritis     BILATERAL HANDS,TOES ON RT. FOOT  . Hyperlipidemia   . Unsteady gait     Lips and chin  . Numbness and tingling   . Adenomatous colon polyp   . Migraine     PAST  SURGICAL HISTORY: Past Surgical History  Procedure Laterality Date  . Oophorectomy    . Cystectomy    . Milk gland removal    . Vaginal hysterectomy    . Laparoscopy    . Esophageal manometry N/A 10/22/2012    Procedure: ESOPHAGEAL MANOMETRY (EM);  Surgeon: Sable Feil, MD;  Location: [facility];  Service: Endoscopy;  Laterality: N/A;  . Esophagogastroduodenoscopy  10/08/2010  . Colonoscopy  10/08/2010    Normal--multiple   . Dilation and curettage of uterus      FAMILY HISTORY: Family History  Problem Relation Age of Onset  . Heart attack Mother   . Bladder Cancer Mother   . Heart disease Mother   . Pancreatic cancer Paternal Grandfather   . Diabetes Paternal Grandmother   . Stroke Paternal Grandmother   . Colon cancer Neg Hx   . Bladder Cancer Father     SOCIAL HISTORY:  History   Social History  . Marital Status: Married    Spouse Name: N/A  . Number of Children: 3  . Years of Education: 14   Occupational History  . clerk of social services    Social History Main Topics  . Smoking status: Former Smoker    Types: Cigarettes    Quit date: 08/09/1987  . Smokeless tobacco: Never Used  . Alcohol Use: 0.0 oz/week    0 Standard drinks or equivalent per week     Comment: Occasionally  . Drug Use: No  . Sexual Activity: Not on file   Other Topics Concern  . Not on file   Social History Narrative   Lives at home with husband.   Right-handed.    1 cup caffeine per day.     PHYSICAL EXAM   Filed Vitals:   01/21/15 1126  BP: 139/83  Pulse: 75  Height: '5\' 6"'  (1.676 m)  Weight: 158 lb (71.668 kg)    Not recorded      Body mass index is 25.51 kg/(m^2).  PHYSICAL EXAMNIATION:  Gen: NAD, conversant, well nourised, obese, well groomed                     Cardiovascular: Regular rate rhythm, no peripheral edema, warm, nontender. Eyes: Conjunctivae clear without exudates or hemorrhage Neck: Supple, no carotid bruise. Pulmonary: Clear to  auscultation bilaterally   NEUROLOGICAL EXAM:  MENTAL STATUS: Speech:    Speech is normal; fluent and spontaneous with normal comprehension.  Cognition:    The patient is oriented to person, place, and time;     recent and remote memory intact;     language fluent;     normal attention, concentration,     fund of knowledge.  CRANIAL NERVES: CN II: Visual fields are full to confrontation. Fundoscopic exam is normal with sharp discs. Pupils were equal round reactive to light CN  III, IV, VI: extraocular movement are normal. No ptosis. CN V: Facial sensation is intact to pinprick in all 3 divisions bilaterally. Corneal responses are intact.  CN VII: Face is symmetric with normal eye closure and smile. CN VIII: Hearing is normal to rubbing fingers CN IX, X: Palate elevates symmetrically. Phonation is normal. CN XI: Head turning and shoulder shrug are intact CN XII: Tongue is midline with normal movements and no atrophy.  MOTOR: There is no pronator drift of out-stretched arms. Muscle bulk and tone are normal. Muscle strength is normal.   Shoulder abduction Shoulder external rotation Elbow flexion Elbow extension Wrist flexion Wrist extension Finger abduction Hip flexion Knee flexion Knee extension Ankle dorsi flexion Ankle plantar flexion  R '5 5 5 5 5 5 5 5 5 5 5 5  ' L '5 5 5 5 5 5 5 5 5 5 5 5    ' REFLEXES: Reflexes are 2+ and symmetric at the biceps, triceps, knees, and ankles. Plantar responses are flexor.  SENSORY: Light touch, pinprick, position sense, and vibration sense are intact in fingers and toes.  COORDINATION: Rapid alternating movements and fine finger movements are intact. There is no dysmetria on finger-to-nose and heel-knee-shin. There are no abnormal or extraneous movements.   GAIT/STANCE: Posture is normal. Gait is steady with normal steps, base, arm swing, and turning. Heel and toe walking are normal. Tandem gait is normal.  Romberg is absent.   DIAGNOSTIC  DATA (LABS, IMAGING, TESTING) - I reviewed patient records, labs, notes, testing and imaging myself where available.  Lab Results  Component Value Date   WBC 5.2 12/24/2014   HGB 15.3 12/24/2014   HCT 46.3 12/24/2014   MCV 89.1 12/24/2014   PLT 207 12/24/2014      Component Value Date/Time   NA 140 12/24/2014 1247   NA 137 04/16/2014 1037   K 4.4 12/24/2014 1247   K 4.8 04/16/2014 1037   CL 100 04/16/2014 1037   CO2 28 12/24/2014 1247   CO2 30 04/16/2014 1037   GLUCOSE 80 12/24/2014 1247   GLUCOSE 88 04/16/2014 1037   BUN 7.9 12/24/2014 1247   BUN 9 04/16/2014 1037   CREATININE 0.8 12/24/2014 1247   CREATININE 0.7 04/16/2014 1037   CALCIUM 10.5* 12/24/2014 1247   CALCIUM 10.7* 04/16/2014 1037   PROT 6.8 12/24/2014 1247   PROT 7.5 04/16/2014 1037   ALBUMIN 4.0 12/24/2014 1247   ALBUMIN 4.0 04/16/2014 1037   AST 18 12/24/2014 1247   AST 22 04/16/2014 1037   ALT 17 12/24/2014 1247   ALT 26 04/16/2014 1037   ALKPHOS 113 12/24/2014 1247   ALKPHOS 84 04/16/2014 1037   BILITOT 0.33 12/24/2014 1247   BILITOT 0.6 04/16/2014 1037   GFRNONAA 92.12 10/06/2009 1007   GFRAA  10/29/2008 0614    >60        The eGFR has been calculated using the MDRD equation. This calculation has not been validated in all clinical situations. eGFR's persistently <60 mL/min signify possible Chronic Kidney Disease.   No results found for: CHOL, HDL, LDLCALC, LDLDIRECT, TRIG, CHOLHDL No results found for: HGBA1C Lab Results  Component Value Date   [id number] 098 04/16/2014   Lab Results  Component Value Date   TSH 1.45 04/03/2013      ASSESSMENT AND PLAN  ALEYZA SALMI is a 59 y.o. female  with past medical history of depression anxiety, hyperlipidemia, chronic migraine,  presenting with one episode of severe left-sided headaches, with associated unsteady  gait, severe to the left side, lasting for a few hours,most consistent with complicated migraines, normal examination now, normal  CAT scan of the brain, ultrasound of carotid artery MRI of the brain was normal in May 2016, she continue have recurrent headaches,  1, Chronic migraine, add on preventive medications, Nortriptyline, titrating to 10 mg 2 tablets every night,Maxalt as needed 2. Return to clinic in 3 months with Rhae Hammock, M.D. Ph.D.  [facility] [address] [place], [place] [address] Ph: [phone] Fax: [phone]

## 2015-02-16 ENCOUNTER — Ambulatory Visit (INDEPENDENT_AMBULATORY_CARE_PROVIDER_SITE_OTHER)
Admission: RE | Admit: 2015-02-16 | Discharge: 2015-02-16 | Disposition: A | Discharge status: Home / Self Care | Payer: BLUE CROSS/BLUE SHIELD | Source: Ambulatory Visit | Attending: Internal Medicine | Admitting: Internal Medicine

## 2015-02-16 DIAGNOSIS — R918 Other nonspecific abnormal finding of lung field: Secondary | ICD-10-CM

## 2015-02-16 MED ORDER — IOHEXOL 300 MG/ML  SOLN
80.0000 mL | Freq: Once | INTRAMUSCULAR | Status: AC | PRN
  Administered 2015-02-16: 80 mL via INTRAVENOUS

## 2015-02-17 ENCOUNTER — Other Ambulatory Visit: Payer: Self-pay

## 2015-02-17 DIAGNOSIS — R918 Other nonspecific abnormal finding of lung field: Secondary | ICD-10-CM

## 2015-03-02 ENCOUNTER — Institutional Professional Consult (permissible substitution): Payer: BLUE CROSS/BLUE SHIELD | Admitting: Internal Medicine

## 2015-03-23 ENCOUNTER — Ambulatory Visit (INDEPENDENT_AMBULATORY_CARE_PROVIDER_SITE_OTHER): Payer: BLUE CROSS/BLUE SHIELD | Admitting: Internal Medicine

## 2015-03-23 ENCOUNTER — Encounter: Payer: Self-pay | Admitting: Internal Medicine

## 2015-03-23 VITALS — BP 124/70 | HR 76 | Ht 66.0 in | Wt 160.4 lb

## 2015-03-23 DIAGNOSIS — R918 Other nonspecific abnormal finding of lung field: Secondary | ICD-10-CM | POA: Insufficient documentation

## 2015-03-23 DIAGNOSIS — R683 Clubbing of fingers: Secondary | ICD-10-CM | POA: Diagnosis not present

## 2015-03-23 NOTE — Progress Notes (Signed)
   Subjective:    Patient ID: VENICIA VANDALL, female    DOB: April 27, 1956,  MRN: [id number]  HPI  1 yowf quit smoking around 1988-89 referred 03/23/2015 by Dr Hilarie Fredrickson for incidental mpns    03/23/2015 1st [facility] office visit/ [facility]   Chief Complaint  Patient presents with  . Pulmonary Consult    Referred by Dr Raquel James for multiple pulmonary nodules found on CT scan on 02-16-15. Pt denies SOB, cough, wheezing, chest discomfort    No   chronic cough or cp or chest tightness, subjective wheeze overt sinus or hb symptoms. No unusual exp hx or h/o childhood pna/ asthma or knowledge of premature birth.  Sleeping ok without nocturnal  or early am exacerbation  of respiratory  c/o's or need for noct saba. Also denies any obvious fluctuation of symptoms with weather or environmental changes or other aggravating or alleviating factors except as outlined above   Current Medications, Allergies, Complete Past Medical History, Past Surgical History, Family History, and Social History were reviewed in [employer] record.           Review of Systems  Constitutional: Negative for fever, chills and unexpected weight change.  HENT: Negative for congestion, dental problem, ear pain, nosebleeds, postnasal drip, rhinorrhea, sinus pressure, sneezing, sore throat, trouble swallowing and voice change.   Eyes: Negative for visual disturbance.  Respiratory: Negative for cough, choking and shortness of breath.   Cardiovascular: Negative for chest pain and leg swelling.  Gastrointestinal: Negative for vomiting, abdominal pain and diarrhea.  Genitourinary: Negative for difficulty urinating.  Musculoskeletal: Negative for arthralgias.  Skin: Negative for rash.  Neurological: Negative for tremors, syncope and headaches.  Hematological: Does not bruise/bleed easily.       Objective:   Physical Exam   Amb wf nad  Wt Readings from Last 3 Encounters:  03/23/15 160 lb 6.4 oz  (72.757 kg)  01/21/15 158 lb (71.668 kg)  12/24/14 159 lb 6 oz (72.292 kg)    Vital signs reviewed   HEENT: nl dentition, turbinates, and orophanx. Nl external ear canals without cough reflex   NECK :  without JVD/Nodes/TM/ nl carotid upstrokes bilaterally   LUNGS: no acc muscle use, clear to A and P bilaterally without cough on insp or exp maneuvers   CV:  RRR  no s3 or murmur or increase in P2, no edema   ABD:  soft and nontender with nl excursion in the supine position. No bruits or organomegaly, bowel sounds nl  MS:  warm without deformities, calf tenderness, cyanosis  - Mild clubbing both hands "all my life"  SKIN: warm and dry without lesions    NEURO:  alert, approp, no deficits     I personally reviewed images and agree with radiology impression as follows:  CT Chest 02/16/15  mpns no change  X one year But now  new 3 mm RLL       Assessment & Plan:

## 2015-03-23 NOTE — Patient Instructions (Signed)
We will call you 02/16/16 to schedule a follow up CT chest no contrast, call us sooner for any respiratory symptoms

## 2015-03-24 ENCOUNTER — Encounter: Payer: Self-pay | Admitting: Internal Medicine

## 2015-03-24 ENCOUNTER — Telehealth: Payer: Self-pay | Admitting: Neurology

## 2015-03-24 DIAGNOSIS — R683 Clubbing of fingers: Secondary | ICD-10-CM | POA: Insufficient documentation

## 2015-03-24 NOTE — Telephone Encounter (Addendum)
She has been crying, overreacting to situations that would not normally bother her and feels angry.  She noticed these symptoms started with nortriptyline 10mg  and became intolerable at 20mg  daily.  Symptoms are still present but she just stopped the medication two nights ago.  She is also still having daily headaches.  Says she has never been on any other preventative medications (she has used amitriptyline in the past without adverse side effects but this was not for migraine prevention).

## 2015-03-24 NOTE — Assessment & Plan Note (Addendum)
CT results reviewed with pt >>> Too small for PET or bx, not suspicious enough for excisional bx > really only option for now is follow the Fleischner society guidelines as rec by radiology > she is low risk but not zero as has remote (> 15 y) hx of smoking.  Discussed in detail all the  indications, usual  risks and alternatives  relative to the benefits with patient who agrees to proceed with following the guidelines and repeating CT chest one year but return in meantime prn new resp symptoms   Total time = 54m review case with pt/ discussion/ counseling/ giving and going over instructions (see avs)

## 2015-03-24 NOTE — Telephone Encounter (Signed)
Pt called about her nortriptyline (PAMELOR) 10 MG capsule, she has stopped taking it. States she crys, rages and is overall very emotional, hard to concentrate. Please call and advise [phone]

## 2015-03-24 NOTE — Assessment & Plan Note (Signed)
Incidentally noted but pt says always that way ? Congenital or related to GI issues but strongly doubt related to mpns

## 2015-03-24 NOTE — Telephone Encounter (Signed)
Please call patient, it is okay for her to stop nortriptyline if she could not tolerate it  The other option would be beta blocker, I can call in Inderal LA 60 mg every day for her, keep follow-up appointment in September, potential side effect of Inderal includes fatigue, slow heart rate, lightheadedness, but 60 mg is a low dose, I expect her tolerating it well.  If she perferrs face-to-face discussion before starting another preventive medications, may book her for next week

## 2015-03-24 NOTE — Telephone Encounter (Signed)
Spoke to pt - she would like an earlier appt - moved to 03/31/15.

## 2015-03-31 ENCOUNTER — Ambulatory Visit (INDEPENDENT_AMBULATORY_CARE_PROVIDER_SITE_OTHER): Payer: BLUE CROSS/BLUE SHIELD | Admitting: Neurology

## 2015-03-31 ENCOUNTER — Encounter: Payer: Self-pay | Admitting: Neurology

## 2015-03-31 VITALS — BP 136/85 | HR 78 | Ht 66.0 in | Wt 161.0 lb

## 2015-03-31 DIAGNOSIS — R0683 Snoring: Secondary | ICD-10-CM | POA: Diagnosis not present

## 2015-03-31 DIAGNOSIS — G43009 Migraine without aura, not intractable, without status migrainosus: Secondary | ICD-10-CM

## 2015-03-31 MED ORDER — PROPRANOLOL HCL ER 60 MG PO CP24: 60.0000 mg | ORAL_CAPSULE | Freq: Every day | ORAL | Status: DC

## 2015-03-31 MED ORDER — SUMATRIPTAN SUCCINATE 50 MG PO TABS: 50.0000 mg | ORAL_TABLET | ORAL | Status: DC | PRN

## 2015-03-31 NOTE — Progress Notes (Signed)
Chief Complaint  Patient presents with  . Migraine    She would like to discuss alternate medications today. Reports nortriptyline caused crying, overreactions to situations that would not normally bother her and made her feel angry.  She has discontinued use.   Chief Complaint  Patient presents with  . Migraine    She would like to discuss alternate medications today. Reports nortriptyline caused crying, overreactions to situations that would not normally bother her and made her feel angry.  She has discontinued use.      PATIENT: [name] DOB: 08-Apr-1956  HISTORICAL  [name] is a 59 yo RH female, referred by her PCP Dr. Sherrlyn Hock for evaluation of unsteady gait, left facial numbness,    She has past medical history of migraine, depression anxiety.  In November 25 2014 she woke up from overnight sleep, feeling well, but when she tried to get up, she felt unsteady gait, veered to her left side, lasting for 1 hour, she also noticed severe left-sided pounding headaches, with left facial numbness,  She was evaluated by her primary care the same day, CAT scan of the brain was normal, I have personally reviewed the film  Laboratory evaluation showed normal CBC, CMP, elevated LDL 183, cholesterol 278  She also had normal ultrasound of carotid artery,  She reported long-standing history of migraine, much improved since she one time protein free diet since 2009.  UPDATE June 15th 2016: She had nerve block for prologned severe headache in April 28th 2016 for prolonged headaches, which has been very helpful.  She had 2 bad headaches since last visit in April 28th, lasting for 2-3 days, could not functions, tramadol helps her some, but only temporarily, also caused constipation  I have personally reviewed MRI of the brain in Jan 03 2015 that was normal..  She still complains of daily headache, bilateral frontal area pressure headaches, few times each week, exacerbated into a more  severe headaches, with large tinnitus, light noise smell sensitivity, lasting hours to days,  Trigger for her headaches are stress, weather change, certain food, such as onion  UPDATE March 31 2015: She continue has mild daily pressure headaches, couple times a week it would be exacerbated to moderate sometimes severe headaches with associated light noise sensitivity, Maxalt used to be helpful, no significant side effect, but in the past 2 weeks, Maxalt has become less effective  She also complains of depression anxiety, she is under a lot of stress, nortriptyline 20 mg has caused her mood swing, has stopping March 18 2015, she has mild improvement in her mood  REVIEW OF SYSTEMS: Full 14 system review of systems performed and notable only for ringing ears, eye itching, eye redness, light sensitivity, constipation, nausea, restless leg, insomnia, frequent awakening, daytime sleepiness, snoring, environmental allergy, food allergy, anemia, headaches, agitation, decreased concentration, depression anxiety   ALLERGIES: Allergies  Allergen Reactions  . Aspirin Nausea Only    HOME MEDICATIONS: Current Outpatient Prescriptions  Medication Sig Dispense Refill  . clonazePAM (KLONOPIN) 0.5 MG tablet Take 1 tablet (0.5 mg total) by mouth 2 (two) times daily as needed. 180 tablet 2  . dicyclomine (BENTYL) 20 MG tablet Take 1 tablet (20 mg total) by mouth 2 (two) times daily. 60 tablet 1  . escitalopram (LEXAPRO) 20 MG tablet Take 20 mg by mouth daily.      . hyoscyamine (LEVSIN, ANASPAZ) 0.125 MG tablet Take 1 tablet (0.125 mg total) by mouth as needed. 180 tablet 3  .  lubiprostone (AMITIZA) 24 MCG capsule Take 1 capsule (24 mcg total) by mouth 2 (two) times daily with a meal. 60 capsule 1  . meloxicam (MOBIC) 15 MG tablet Take 1 tablet (15 mg total) by mouth daily as needed for pain (take with food.). 60 tablet 1  . ondansetron (ZOFRAN ODT) 4 MG disintegrating tablet Take 1 tablet (4 mg total) by  mouth every 8 (eight) hours as needed for nausea or vomiting. 30 tablet 0  . pantoprazole (PROTONIX) 40 MG tablet Take 1 tablet (40 mg total) by mouth 2 (two) times daily. 180 tablet 1     PAST MEDICAL HISTORY: Past Medical History  Diagnosis Date  . Bladder disease   . Syncope   . Hepatic cyst   . Chronic interstitial cystitis   . Depressive disorder, not elsewhere classified   . Esophageal reflux   . Unspecified constipation   . Irritable bowel syndrome   . Anemia   . Hemorrhoids   . Pulmonary nodules   . Fundic gland polyps of stomach, benign   . GERD (gastroesophageal reflux disease)   . Allergy     SEASONAL  . Anxiety   . Arthritis     BILATERAL HANDS,TOES ON RT. FOOT  . Hyperlipidemia   . Unsteady gait     Lips and chin  . Numbness and tingling   . Adenomatous colon polyp   . Migraine     PAST SURGICAL HISTORY: Past Surgical History  Procedure Laterality Date  . Oophorectomy    . Cystectomy    . Milk gland removal    . Vaginal hysterectomy    . Laparoscopy    . Esophageal manometry N/A 10/22/2012    Procedure: ESOPHAGEAL MANOMETRY (EM);  Surgeon: Sable Feil, MD;  Location: [facility];  Service: Endoscopy;  Laterality: N/A;  . Esophagogastroduodenoscopy  10/08/2010  . Colonoscopy  10/08/2010    Normal--multiple   . Dilation and curettage of uterus      FAMILY HISTORY: Family History  Problem Relation Age of Onset  . Heart attack Mother   . Bladder Cancer Mother   . Heart disease Mother   . Pancreatic cancer Paternal Grandfather   . Diabetes Paternal Grandmother   . Stroke Paternal Grandmother   . Colon cancer Neg Hx   . Bladder Cancer Father     SOCIAL HISTORY:  Social History   Social History  . Marital Status: Married    Spouse Name: N/A  . Number of Children: 3  . Years of Education: 14   Occupational History  . clerk of social services    Social History Main Topics  . Smoking status: Former Smoker -- 0.50 packs/day for 16 years     Types: Cigarettes    Quit date: 08/09/1987  . Smokeless tobacco: Never Used  . Alcohol Use: 0.0 oz/week    0 Standard drinks or equivalent per week     Comment: Occasionally  . Drug Use: No  . Sexual Activity: Not on file   Other Topics Concern  . Not on file   Social History Narrative   Lives at home with husband.   Right-handed.    1 cup caffeine per day.     PHYSICAL EXAM   Filed Vitals:   03/31/15 0857  BP: 136/85  Pulse: 78  Height: '5\' 6"'  (1.676 m)  Weight: 161 lb (73.029 kg)    Not recorded      Body mass index is 26 kg/(m^2).  PHYSICAL EXAMNIATION:  Gen: NAD, conversant, well nourised, obese, well groomed                     Cardiovascular: Regular rate rhythm, no peripheral edema, warm, nontender. Eyes: Conjunctivae clear without exudates or hemorrhage Neck: Supple, no carotid bruise. Pulmonary: Clear to auscultation bilaterally   NEUROLOGICAL EXAM:  MENTAL STATUS: Speech:    Speech is normal; fluent and spontaneous with normal comprehension.  Cognition:    The patient is oriented to person, place, and time;     recent and remote memory intact;     language fluent;     normal attention, concentration,     fund of knowledge.  CRANIAL NERVES: CN II: Visual fields are full to confrontation. Fundoscopic exam is normal with sharp discs. Pupils were equal round reactive to light CN III, IV, VI: extraocular movement are normal. No ptosis. CN V: Facial sensation is intact to pinprick in all 3 divisions bilaterally. Corneal responses are intact.  CN VII: Face is symmetric with normal eye closure and smile. CN VIII: Hearing is normal to rubbing fingers CN IX, X: Palate elevates symmetrically. Phonation is normal. CN XI: Head turning and shoulder shrug are intact CN XII: Tongue is midline with normal movements and no atrophy.  MOTOR: There is no pronator drift of out-stretched arms. Muscle bulk and tone are normal. Muscle strength is  normal.  REFLEXES: Reflexes are 2+ and symmetric at the biceps, triceps, knees, and ankles. Plantar responses are flexor.  SENSORY: Light touch, pinprick, position sense, and vibration sense are intact in fingers and toes.  COORDINATION: Rapid alternating movements and fine finger movements are intact. There is no dysmetria on finger-to-nose and heel-knee-shin. There are no abnormal or extraneous movements.   GAIT/STANCE: Posture is normal. Gait is steady with normal steps, base, arm swing, and turning. Heel and toe walking are normal. Tandem gait is normal.  Romberg is absent.   DIAGNOSTIC DATA (LABS, IMAGING, TESTING) - I reviewed patient records, labs, notes, testing and imaging myself where available.  Lab Results  Component Value Date   WBC 5.2 12/24/2014   HGB 15.3 12/24/2014   HCT 46.3 12/24/2014   MCV 89.1 12/24/2014   PLT 207 12/24/2014      Component Value Date/Time   NA 140 12/24/2014 1247   NA 137 04/16/2014 1037   K 4.4 12/24/2014 1247   K 4.8 04/16/2014 1037   CL 100 04/16/2014 1037   CO2 28 12/24/2014 1247   CO2 30 04/16/2014 1037   GLUCOSE 80 12/24/2014 1247   GLUCOSE 88 04/16/2014 1037   BUN 7.9 12/24/2014 1247   BUN 9 04/16/2014 1037   CREATININE 0.8 12/24/2014 1247   CREATININE 0.7 04/16/2014 1037   CALCIUM 10.5* 12/24/2014 1247   CALCIUM 10.7* 04/16/2014 1037   PROT 6.8 12/24/2014 1247   PROT 7.5 04/16/2014 1037   ALBUMIN 4.0 12/24/2014 1247   ALBUMIN 4.0 04/16/2014 1037   AST 18 12/24/2014 1247   AST 22 04/16/2014 1037   ALT 17 12/24/2014 1247   ALT 26 04/16/2014 1037   ALKPHOS 113 12/24/2014 1247   ALKPHOS 84 04/16/2014 1037   BILITOT 0.33 12/24/2014 1247   BILITOT 0.6 04/16/2014 1037   GFRNONAA 92.12 10/06/2009 1007   GFRAA  10/29/2008 0614    >60        The eGFR has been calculated using the MDRD equation. This calculation has not been validated in all clinical situations. eGFR's persistently <60 mL/min signify possible  Chronic  Kidney Disease.   No results found for: CHOL, HDL, LDLCALC, LDLDIRECT, TRIG, CHOLHDL No results found for: HGBA1C Lab Results  Component Value Date   FAWNOPWK56 154 04/16/2014   Lab Results  Component Value Date   TSH 1.45 04/03/2013    ASSESSMENT AND PLAN  [name] is a 59 y.o. female  with past medical history of depression anxiety, hyperlipidemia, chronic migraine,  presenting with one episode of severe left-sided headaches, with associated unsteady gait, severe to the left side, lasting for a few hours,most consistent with complicated migraines, normal examination now, normal CAT scan of the brain, ultrasound of carotid artery MRI of the brain was normal in May 2016, she continue have recurrent headaches,  Chronic migraine  Nortriptyline cause mood swings she stopped in August 2016,Maxalt is less effective, but no side effect   Will try Inderal LA 60 mg daily as migraine prevention  Imitrex 50 mg as needed Depression anxiety  Snoring, excessive daytime sleepiness  Today's ESS score is 14, she has small jaw, is at high risk for obstructive sleep apnea, I have advised her continue to observe her symptoms, may consider sleep study  Marcial Pacas, M.D. Ph.D.  [facility] [address], [place], [place] [address] Ph: [phone] Fax: [phone]

## 2015-04-01 ENCOUNTER — Telehealth: Payer: Self-pay | Admitting: Internal Medicine

## 2015-04-01 MED ORDER — ONDANSETRON 4 MG PO TBDP: 4.0000 mg | ORAL_TABLET | Freq: Three times a day (TID) | ORAL | Status: DC | PRN

## 2015-04-01 NOTE — Telephone Encounter (Signed)
Rx sent 

## 2015-04-09 ENCOUNTER — Other Ambulatory Visit: Payer: Self-pay | Admitting: Internal Medicine

## 2015-04-19 ENCOUNTER — Other Ambulatory Visit: Payer: Self-pay

## 2015-04-19 MED ORDER — SUMATRIPTAN SUCCINATE 50 MG PO TABS: 50.0000 mg | ORAL_TABLET | ORAL | Status: DC | PRN

## 2015-04-20 ENCOUNTER — Ambulatory Visit: Payer: BLUE CROSS/BLUE SHIELD | Admitting: Neurology

## 2015-04-22 ENCOUNTER — Ambulatory Visit: Payer: BLUE CROSS/BLUE SHIELD | Admitting: Neurology

## 2015-04-22 ENCOUNTER — Ambulatory Visit (HOSPITAL_BASED_OUTPATIENT_CLINIC_OR_DEPARTMENT_OTHER): Payer: BLUE CROSS/BLUE SHIELD | Admitting: Oncology

## 2015-04-22 ENCOUNTER — Telehealth: Payer: Self-pay | Admitting: Oncology

## 2015-04-22 ENCOUNTER — Other Ambulatory Visit (HOSPITAL_BASED_OUTPATIENT_CLINIC_OR_DEPARTMENT_OTHER): Payer: BLUE CROSS/BLUE SHIELD

## 2015-04-22 VITALS — BP 150/68 | HR 60 | Temp 98.3°F | Resp 18 | Ht 66.0 in | Wt 164.5 lb

## 2015-04-22 DIAGNOSIS — D509 Iron deficiency anemia, unspecified: Secondary | ICD-10-CM

## 2015-04-22 DIAGNOSIS — R918 Other nonspecific abnormal finding of lung field: Secondary | ICD-10-CM | POA: Diagnosis not present

## 2015-04-22 DIAGNOSIS — R200 Anesthesia of skin: Secondary | ICD-10-CM

## 2015-04-22 DIAGNOSIS — R269 Unspecified abnormalities of gait and mobility: Secondary | ICD-10-CM | POA: Diagnosis not present

## 2015-04-22 LAB — CBC WITH DIFFERENTIAL/PLATELET
BASO%: 0.9 % (ref 0.0–2.0)
BASOS ABS: 0.1 10*3/uL (ref 0.0–0.1)
EOS%: 1.3 % (ref 0.0–7.0)
Eosinophils Absolute: 0.1 10*3/uL (ref 0.0–0.5)
HCT: 43.8 % (ref 34.8–46.6)
HGB: 14.7 g/dL (ref 11.6–15.9)
LYMPH#: 2.3 10*3/uL (ref 0.9–3.3)
LYMPH%: 32.7 % (ref 14.0–49.7)
MCH: 31 pg (ref 25.1–34.0)
MCHC: 33.5 g/dL (ref 31.5–36.0)
MCV: 92.6 fL (ref 79.5–101.0)
MONO#: 0.6 10*3/uL (ref 0.1–0.9)
MONO%: 8.1 % (ref 0.0–14.0)
NEUT#: 4 10*3/uL (ref 1.5–6.5)
NEUT%: 57 % (ref 38.4–76.8)
PLATELETS: 202 10*3/uL (ref 145–400)
RBC: 4.73 10*6/uL (ref 3.70–5.45)
RDW: 12.7 % (ref 11.2–14.5)
WBC: 7.1 10*3/uL (ref 3.9–10.3)

## 2015-04-22 LAB — COMPREHENSIVE METABOLIC PANEL (CC13)
ALBUMIN: 4 g/dL (ref 3.5–5.0)
ALK PHOS: 100 U/L (ref 40–150)
ALT: 17 U/L (ref 0–55)
AST: 17 U/L (ref 5–34)
Anion Gap: 8 mEq/L (ref 3–11)
BUN: 10.8 mg/dL (ref 7.0–26.0)
CALCIUM: 10.7 mg/dL — AB (ref 8.4–10.4)
CO2: 28 mEq/L (ref 22–29)
Chloride: 102 mEq/L (ref 98–109)
Creatinine: 0.8 mg/dL (ref 0.6–1.1)
EGFR: 81 mL/min/{1.73_m2} — AB (ref >90)
Glucose: 99 mg/dl (ref 70–140)
Potassium: 4.5 mEq/L (ref 3.5–5.1)
Sodium: 138 mEq/L (ref 136–145)
Total Bilirubin: 0.42 mg/dL (ref 0.20–1.20)
Total Protein: 6.9 g/dL (ref 6.4–8.3)

## 2015-04-22 LAB — IRON AND TIBC CHCC
%SAT: 28 % (ref 21–57)
Iron: 93 ug/dL (ref 41–142)
TIBC: 335 ug/dL (ref 236–444)
UIBC: 242 ug/dL (ref 120–384)

## 2015-04-22 LAB — FERRITIN CHCC: FERRITIN: 66 ng/mL (ref 9–269)

## 2015-04-22 NOTE — Telephone Encounter (Signed)
Pt confirmed labs/ov per 09/14 POF, gave pt AVS and Calendar.... KJ °

## 2015-04-22 NOTE — Progress Notes (Signed)
Hematology and Oncology Follow Up Visit  [name] [id number] 09/11/1955 59 y.o. 04/22/2015 8:32 AM [name], MDSettle, Hall Busing, MD   Principle Diagnosis: 59 year old woman with iron deficiency anemia diagnosed in September 2015. She had a prior level of 30 and saturation of 6.1%. Her hemoglobin was 11.3.   Prior Therapy: IV iron in the form of Feraheme given on February 22 and February 29 of 2016 for a total of 1020 mg.  Current therapy: Oral iron replacement.  Interim History: [name] presents today for a follow-up visit. Since the last visit, she continues to do well without any new complaints. Her energy continues to be stable and able to perform activities of daily living. She recently traveled and was able to drive for an extended period of time. She did have an episode of facial numbness on  11/25/2014 and follow-up MRI did not show any acute abnormalities. She does not report any hematochezia or bright blood per rectum. She does not report any hemoptysis. She is able to tolerate oral iron without any complications.   She does have recurrent migraines as is her occasional headaches and presyncopal feeling. She does report occasional nausea. She able to eat well. She does not report any constitutional symptoms. She does not report any weight loss or appetite changes. She does not report any cough or hemoptysis. She does not report any genitourinary complaints. Remaining review of systems unremarkable.  Medications: I have reviewed the patient's current medications.  Current Outpatient Prescriptions  Medication Sig Dispense Refill  . AMITIZA 24 MCG capsule TAKE 1 CAPSULE TWICE A DAY WITH A MEAL 60 capsule 0  . aspirin EC 81 MG tablet Take 81 mg by mouth daily.    . clonazePAM (KLONOPIN) 0.5 MG tablet Take 1 tablet (0.5 mg total) by mouth 2 (two) times daily as needed. 180 tablet 2  . dicyclomine (BENTYL) 20 MG tablet Take 1 tablet (20 mg total) by mouth 2 (two) times daily. 60  tablet 1  . doxycycline (PERIOSTAT) 20 MG tablet Take 20 mg by mouth 2 (two) times daily.    Marland Kitchen escitalopram (LEXAPRO) 20 MG tablet Take 20 mg by mouth daily.      . hyoscyamine (LEVSIN, ANASPAZ) 0.125 MG tablet Take 1 tablet (0.125 mg total) by mouth as needed. 180 tablet 3  . Iron-Vitamin C (VITRON-C PO) Take 1 capsule by mouth daily.    . Iron-Vitamin C 65-125 MG TABS Take by mouth.    . meloxicam (MOBIC) 15 MG tablet Take 1 tablet (15 mg total) by mouth daily as needed for pain (take with food.). 60 tablet 1  . metroNIDAZOLE (METROCREAM) 0.75 % cream Apply topically 2 (two) times daily.    . ondansetron (ZOFRAN ODT) 4 MG disintegrating tablet Take 1 tablet (4 mg total) by mouth every 8 (eight) hours as needed for nausea or vomiting. 30 tablet 0  . pantoprazole (PROTONIX) 40 MG tablet     . propranolol ER (INDERAL LA) 60 MG 24 hr capsule Take 1 capsule (60 mg total) by mouth daily. 30 capsule 6  . SUMAtriptan (IMITREX) 50 MG tablet Take 1 tablet (50 mg total) by mouth as needed for migraine. May repeat in 2 hours if headache persists or recurs. 27 tablet 1  . traMADol (ULTRAM) 50 MG tablet Take 50 mg by mouth daily as needed.      No current facility-administered medications for this visit.     Allergies:  Allergies  Allergen Reactions  .  Aspirin Nausea Only    Past Medical History, Surgical history, Social history, and Family History were reviewed and updated.   Physical Exam: Blood pressure 150/68, pulse 60, temperature 98.3 F (36.8 C), temperature source Oral, resp. rate 18, height 5\' 6"  (1.676 m), weight 164 lb 8 oz (74.617 kg), SpO2 100 %. ECOG: 0 General appearance: alert and cooperative appears in no active distress. Head: Normocephalic, without obvious abnormality Neck: no adenopathy Lymph nodes: Cervical, supraclavicular, and axillary nodes normal. Heart:regular rate and rhythm, S1, S2 normal, no murmur, click, rub or gallop Lung:chest clear, no wheezing, rales, normal  symmetric air entry Abdomin: soft, non-tender, without masses or organomegaly EXT:no erythema, induration, or nodules   Lab Results: Lab Results  Component Value Date   WBC 7.1 04/22/2015   HGB 14.7 04/22/2015   HCT 43.8 04/22/2015   MCV 92.6 04/22/2015   PLT 202 04/22/2015     Chemistry      Component Value Date/Time   NA 140 12/24/2014 1247   NA 137 04/16/2014 1037   K 4.4 12/24/2014 1247   K 4.8 04/16/2014 1037   CL 100 04/16/2014 1037   CO2 28 12/24/2014 1247   CO2 30 04/16/2014 1037   BUN 7.9 12/24/2014 1247   BUN 9 04/16/2014 1037   CREATININE 0.8 12/24/2014 1247   CREATININE 0.7 04/16/2014 1037      Component Value Date/Time   CALCIUM 10.5* 12/24/2014 1247   CALCIUM 10.7* 04/16/2014 1037   ALKPHOS 113 12/24/2014 1247   ALKPHOS 84 04/16/2014 1037   AST 18 12/24/2014 1247   AST 22 04/16/2014 1037   ALT 17 12/24/2014 1247   ALT 26 04/16/2014 1037   BILITOT 0.33 12/24/2014 1247   BILITOT 0.6 04/16/2014 1037      Results for KANNA, DAFOE (MRN [id number]) as of 04/22/2015 08:20  Ref. Range 12/24/2014 12:47  Iron Latest Ref Range: 41-142 ug/dL 112  UIBC Latest Ref Range: 120-384 ug/dL 200  TIBC Latest Ref Range: 236-444 ug/dL 313  %SAT Latest Ref Range: 21-57 % 36  Ferritin Latest Ref Range: 9-269 ng/ml 74    [facility]  NEUROIMAGING REPORT   STUDY DATE: 12/24/14 PATIENT NAME: [name] DOB: September 19, 1955 MRN: [id number]  ORDERING CLINICIAN: Marcial Pacas, MD PhD  CLINICAL HISTORY: 59 year old female with numbness, headache and gait difficulty.  EXAM: MRI brain (without)  TECHNIQUE: MRI of the brain without contrast was obtained utilizing 5 mm axial slices with T1, T2, T2 flair, SWI and diffusion weighted views. T1 sagittal and T2 coronal views were obtained. CONTRAST: no IMAGING SITE: [facility] [address] (1.5 Tesla MRI)   FINDINGS:  No abnormal lesions are seen on diffusion-weighted views to suggest acute  ischemia. The cortical sulci, fissures and cisterns are normal in size and appearance. Lateral, third and fourth ventricle are normal in size and appearance. No extra-axial fluid collections are seen. No evidence of mass effect or midline shift.   On sagittal views the posterior fossa, pituitary gland and corpus callosum are unremarkable. No evidence of intracranial hemorrhage on SWI views. The orbits and their contents, paranasal sinuses and calvarium are unremarkable. Intracranial flow voids are present.   IMPRESSION:  Normal MRI brain (without).   CLINICAL DATA: Routine follow-up of pulmonary nodules.  EXAM: CT CHEST WITH CONTRAST  TECHNIQUE: Multidetector CT imaging of the chest was performed during intravenous contrast administration.  CONTRAST: 7mL OMNIPAQUE IOHEXOL 300 MG/ML SOLN  COMPARISON: 02/14/2014  FINDINGS: Mediastinum: The  heart size appears normal. There is no no pericardial effusion. The trachea appears patent and is midline. Normal appearance of the esophagus. There is no mediastinal or hilar adenopathy.  Lungs/Pleura: No pleural effusion. There is no airspace consolidation or atelectasis. 3 mm right middle lobe lung nodule is identified and appears unchanged, image 35/series 3. Left lower lobe pulmonary nodule measures 3 mm and appears unchanged from previous exam, image 45/series 3. There is a 3 mm right lower lobe lung nodule, image 30/series 3. This area was not imaged on the previous exam.  Upper Abdomen: No suspicious liver abnormalities identified. The adrenal glands are both normal. Visualized portions of the kidneys and spleen appear normal.  Musculoskeletal: No aggressive lytic or sclerotic bone lesion identified.  IMPRESSION: 1. No acute findings within the chest. 2. The previously noted small pulmonary nodules are stable compared with previous exam. Findings are compatible with a benign process and no further followup of  these nodules indicated. 3. Not imaged on previous exam is a 3 mm right lower lobe lung nodule. If the patient is at high risk for bronchogenic carcinoma, follow-up chest CT at 1year is recommended. If the patient is at low risk, no follow-up is needed. This recommendation follows the consensus statement: Guidelines for Management of Small Pulmonary Nodules Detected on CT Scans: A Statement from the [place] as published in Radiology 2005; 237:395-400.     Impression and Plan:   59 year old with the following issues:  1. Iron deficiency anemia documented by a hemoglobin of 11.3 and ferritin of 4.7. He is status post IV iron replacement with her hemoglobin collecting to normal range. Her hemoglobin from today continues to be normal at 14.7. She is currently on oral iron and have tolerated it without any complications. She does not have any active bleeding symptoms at this time and she is up-to-date on her colon cancer screening. The plan is to continue with active surveillance and repeat iron studies in 6 months. If she develops significant drop in her iron levels, we will consider repeat treatment with IV iron. I continue to encourage her to take oral iron with vitamin C for better absorption.   2. Facial numbness and gait disturbance: Her neurological workup is unrevealing. Her MRI was reviewed and showed no acute abnormalities. Without any evidence of malignancy.  3. Pulmonary nodule: She had a CT scan of the chest on 02/16/2015 and that was reviewed today. There is a small pulmonary nodule that is compared to previous exam and these findings are compatible with benign findings. There is also a 3 mm lower lobe lung nodule but the patient is not a high risk for bronchogenic carcinoma. I do not think she needs a follow-up CT scan at this time.  4. Age-appropriate cancer screening: She is up-to-date on colon cancer screening as well as mammography.  5. Follow-up will be in 6  months.    [facility], MD 9/14/20168:32 AM

## 2015-06-18 ENCOUNTER — Ambulatory Visit (INDEPENDENT_AMBULATORY_CARE_PROVIDER_SITE_OTHER): Payer: BLUE CROSS/BLUE SHIELD | Admitting: Internal Medicine

## 2015-06-18 ENCOUNTER — Encounter: Payer: Self-pay | Admitting: Internal Medicine

## 2015-06-18 VITALS — BP 110/60 | HR 66 | Ht 66.0 in | Wt 162.7 lb

## 2015-06-18 DIAGNOSIS — K219 Gastro-esophageal reflux disease without esophagitis: Secondary | ICD-10-CM

## 2015-06-18 DIAGNOSIS — D509 Iron deficiency anemia, unspecified: Secondary | ICD-10-CM

## 2015-06-18 DIAGNOSIS — K581 Irritable bowel syndrome with constipation: Secondary | ICD-10-CM

## 2015-06-18 MED ORDER — PANTOPRAZOLE SODIUM 40 MG PO TBEC: 40.0000 mg | DELAYED_RELEASE_TABLET | Freq: Every day | ORAL | Status: DC

## 2015-06-18 MED ORDER — ONDANSETRON 4 MG PO TBDP: 4.0000 mg | ORAL_TABLET | Freq: Three times a day (TID) | ORAL | Status: DC | PRN

## 2015-06-18 NOTE — Progress Notes (Signed)
Subjective:    Patient ID: [name], female    DOB: September 24, 1955, 59 y.o.   MRN: [id number]  HPI [name] is a 59 yo female with PMH of IBS with constipation, IDA s/p IV iron, adenomatous colon polyps, GERD, who is seen for follow-up. She is here alone today was last seen in May 2016. She also has a history of interstitial cystitis and migraines. Today she reports that she has been feeling fairly well. Her bowel movements have been more regular and she is using over-the-counter product called cleanse more 2 tablets at bedtime. She is having a bowel movement most every day. She previously been taking Zantac for reflux which was not working well and she switch back to pantoprazole 40 mg daily. With this her reflux is been very well controlled. She denies dysphagia or odynophagia. Stress definitely affects her bowel movements and she is starting to deal with this. Recently a relative fell off a ladder fracturing multiple ribs. This was quite stressful for the family and during this period her constipation and bowel movements changed for several days. She continues to take doxycycline for rosacea. She is not needing antispasmodics at this time.   She is following with Dr. Alen Blew received IV iron earlier this year. She was seen in September and iron studies were normal along with hemoglobin  Review of Systems As per history of present illness, otherwise negative  Current Medications, Allergies, Past Medical History, Past Surgical History, Family History and Social History were reviewed in [employer] record.     Objective:   Physical Exam BP 110/60 mmHg  Pulse 66  Ht 5\' 6"  (1.676 m)  Wt 162 lb 11.2 oz (73.8 kg)  BMI 26.27 kg/m2 Constitutional: Well-developed and well-nourished. No distress. HEENT: Normocephalic and atraumatic. Oropharynx is clear and moist. No oropharyngeal exudate. Conjunctivae are normal.  No scleral icterus. Neck: Neck supple. Trachea  midline. Cardiovascular: Normal rate, regular rhythm and intact distal pulses. No M/R/G Pulmonary/chest: Effort normal and breath sounds normal. No wheezing, rales or rhonchi. Abdominal: Soft, nontender, nondistended. Bowel sounds active throughout. There are no masses palpable. No hepatosplenomegaly. Extremities: no clubbing, cyanosis, or edema Lymphadenopathy: No cervical adenopathy noted. Neurological: Alert and oriented to person place and time. Skin: Skin is warm and dry. No rashes noted. Psychiatric: Normal mood and affect. Behavior is normal.  CBC    Component Value Date/Time   WBC 7.1 04/22/2015 0809   WBC 6.4 08/25/2014 1052   RBC 4.73 04/22/2015 0809   RBC 4.62 08/25/2014 1052   HGB 14.7 04/22/2015 0809   HGB 11.3* 08/25/2014 1052   HCT 43.8 04/22/2015 0809   HCT 36.2 08/25/2014 1052   PLT 202 04/22/2015 0809   PLT 262.0 08/25/2014 1052   MCV 92.6 04/22/2015 0809   MCV 78.4 08/25/2014 1052   MCH 31.0 04/22/2015 0809   MCHC 33.5 04/22/2015 0809   MCHC 31.2 08/25/2014 1052   RDW 12.7 04/22/2015 0809   RDW 16.4* 08/25/2014 1052   LYMPHSABS 2.3 04/22/2015 0809   LYMPHSABS 2.4 08/25/2014 1052   MONOABS 0.6 04/22/2015 0809   MONOABS 0.7 08/25/2014 1052   EOSABS 0.1 04/22/2015 0809   EOSABS 0.0 08/25/2014 1052   BASOSABS 0.1 04/22/2015 0809   BASOSABS 0.0 08/25/2014 1052    Iron/TIBC/Ferritin/ %Sat    Component Value Date/Time   IRON 93 04/22/2015 0809   IRON 47 08/25/2014 1052   TIBC 335 04/22/2015 0809   FERRITIN 66 04/22/2015 0809  FERRITIN 4.7* 08/25/2014 1052   IRONPCTSAT 28 04/22/2015 0809   IRONPCTSAT 9.4* 08/25/2014 1052      Assessment & Plan:   59 yo female with PMH of IBS with constipation, IDA s/p IV iron, adenomatous colon polyps, GERD, who is seen for follow-up.  1. IBS with constipation -- doing well with over-the-counter laxative product. Will continue with this medication for now. Can use when necessary antispasmodics. She is learning about  how to deal with her IBS is it relates to stressful situations.  2. IDA -- followed by hematology and iron studies most recently normal. This is encouraging. May need additional iron in the future but will follow iron studies and hemoglobin with hematology  3. GERD -- H2 blocker not as effective most recently and she switch back to pantoprazole. It is working well. Continue 40 mg daily  One year follow-up, sooner if necessary

## 2015-06-18 NOTE — Patient Instructions (Signed)
I have sent your protonix with one year refill to your mailorder pharmacy and zofran to your local pharmacy Please continue your over the counter laxative as needed Please follow up with Dr. Hilarie Fredrickson in 1 year.

## 2015-09-28 ENCOUNTER — Encounter: Payer: Self-pay | Admitting: Nurse Practitioner

## 2015-09-28 ENCOUNTER — Ambulatory Visit (INDEPENDENT_AMBULATORY_CARE_PROVIDER_SITE_OTHER): Payer: BLUE CROSS/BLUE SHIELD | Admitting: Nurse Practitioner

## 2015-09-28 VITALS — BP 130/68 | HR 72 | Ht 66.0 in | Wt 168.0 lb

## 2015-09-28 DIAGNOSIS — G43909 Migraine, unspecified, not intractable, without status migrainosus: Secondary | ICD-10-CM

## 2015-09-28 DIAGNOSIS — F32A Depression, unspecified: Secondary | ICD-10-CM

## 2015-09-28 DIAGNOSIS — F329 Major depressive disorder, single episode, unspecified: Secondary | ICD-10-CM

## 2015-09-28 MED ORDER — SUMATRIPTAN SUCCINATE 50 MG PO TABS: 50.0000 mg | ORAL_TABLET | ORAL | Status: DC | PRN

## 2015-09-28 NOTE — Progress Notes (Signed)
I have reviewed and agreed above plan. 

## 2015-09-28 NOTE — Patient Instructions (Signed)
Given information on migraine triggers Continue Imitrex as ordered as ordered will refill Follow up 6 months

## 2015-09-28 NOTE — Progress Notes (Signed)
[facility]  PATIENT: [name] DOB: 09/16/1955   REASON FOR VISIT: Follow-up for migraine HISTORY FROM: Patient    HISTORY OF PRESENT ILLNESS: HISTORY: [name] is a 60 yo RH female, referred by her PCP Dr. Sherrlyn Hock for evaluation of unsteady gait, left facial numbness,   She has past medical history of migraine, depression anxiety.  In November 25 2014 she woke up from overnight sleep, feeling well, but when she tried to get up, she felt unsteady gait, veered to her left side, lasting for 1 hour, she also noticed severe left-sided pounding headaches, with left facial numbness, She was evaluated by her primary care the same day, CAT scan of the brain was normal, I have personally reviewed the film Laboratory evaluation showed normal CBC, CMP, elevated LDL 183, cholesterol 278 She also had normal ultrasound of carotid artery, She reported long-standing history of migraine, much improved since she one time protein free diet since 2009.  UPDATE June 15th 2016:YYShe had nerve block for prologned severe headache in April 28th 2016 for prolonged headaches, which has been very helpful. She had 2 bad headaches since last visit in April 28th, lasting for 2-3 days, could not functions, tramadol helps her some, but only temporarily, also caused constipation I have personally reviewed MRI of the brain in Jan 03 2015 that was normal.. She still complains of daily headache, bilateral frontal area pressure headaches, few times each week, exacerbated into a more severe headaches, with large tinnitus, light noise smell sensitivity, lasting hours to days, Trigger for her headaches are stress, weather change, certain food, such as onion UPDATE August 23 2016YY:She continue has mild daily pressure headaches, couple times a week it would be exacerbated to moderate sometimes severe headaches with associated light noise sensitivity, Maxalt used to be helpful, no significant side  effect, but in the past 2 weeks, Maxalt has become less effective She also complains of depression anxiety, she is under a lot of stress, nortriptyline 20 mg has caused her mood swing, has stopping March 18 2015, she has mild improvement in her mood UPDATE 09/28/2015 [facility] [name], 60 year old female returns for follow-up she has a history of migraines and was last seen in the office by Dr. Krista Blue 03/31/2015. She has failed nortriptyline in the past and was placed on propranolol at her last visit however she is not longer taking the medication. Imitrex works acutely. She has about 1 headache per week or less presently. She feels her headaches are in better control. She is not aware of any migraine triggers. She returns for reevaluation   REVIEW OF SYSTEMS: Full 14 system review of systems performed and notable only for those listed, all others are neg:  Constitutional: Fatigue  Cardiovascular: neg Ear/Nose/Throat: Ringing in the ears Skin: neg Eyes: Light sensitivity Respiratory: neg Gastroitestinal: neg  Hematology/Lymphatic: neg  Endocrine: neg Musculoskeletal: Neck pain back pain Allergy/Immunology: neg Neurological: Headache Psychiatric: Depression and anxiety Sleep : Snoring   ALLERGIES: Allergies  Allergen Reactions  . Aspirin Nausea Only    HOME MEDICATIONS: Outpatient Prescriptions Prior to Visit  Medication Sig Dispense Refill  . clonazePAM (KLONOPIN) 0.5 MG tablet Take 1 tablet (0.5 mg total) by mouth 2 (two) times daily as needed. 180 tablet 2  . dicyclomine (BENTYL) 20 MG tablet Take 1 tablet (20 mg total) by mouth 2 (two) times daily. (Patient taking differently: Take 20 mg by mouth 2 (two) times daily as needed. ) 60 tablet 1  . escitalopram (LEXAPRO)  20 MG tablet Take 20 mg by mouth daily.      . hyoscyamine (LEVSIN, ANASPAZ) 0.125 MG tablet Take 1 tablet (0.125 mg total) by mouth as needed. 180 tablet 3  . Iron-Vitamin C (VITRON-C PO) Take 1 capsule by mouth daily.      . meloxicam (MOBIC) 15 MG tablet Take 1 tablet (15 mg total) by mouth daily as needed for pain (take with food.). 60 tablet 1  . metroNIDAZOLE (METROCREAM) 0.75 % cream Apply topically 2 (two) times daily.    . ondansetron (ZOFRAN ODT) 4 MG disintegrating tablet Take 1 tablet (4 mg total) by mouth every 8 (eight) hours as needed for nausea or vomiting. 30 tablet 5  . OVER THE COUNTER MEDICATION Cleanse More- 2 capsules daily    . pantoprazole (PROTONIX) 40 MG tablet Take 1 tablet (40 mg total) by mouth daily. 90 tablet 3  . SUMAtriptan (IMITREX) 50 MG tablet Take 1 tablet (50 mg total) by mouth as needed for migraine. May repeat in 2 hours if headache persists or recurs. 27 tablet 1  . traMADol (ULTRAM) 50 MG tablet Take 50 mg by mouth daily as needed.     . doxycycline (PERIOSTAT) 20 MG tablet Take 20 mg by mouth 2 (two) times daily. Reported on 09/28/2015    . Iron-Vitamin C 65-125 MG TABS Take by mouth. Reported on 09/28/2015     No facility-administered medications prior to visit.    PAST MEDICAL HISTORY: Past Medical History  Diagnosis Date  . Bladder disease   . Syncope   . Hepatic cyst   . Chronic interstitial cystitis   . Depressive disorder, not elsewhere classified   . Esophageal reflux   . Unspecified constipation   . Irritable bowel syndrome   . Anemia   . Hemorrhoids   . Pulmonary nodules   . Fundic gland polyps of stomach, benign   . GERD (gastroesophageal reflux disease)   . Allergy     SEASONAL  . Anxiety   . Arthritis     BILATERAL HANDS,TOES ON RT. FOOT  . Hyperlipidemia   . Unsteady gait     Lips and chin  . Numbness and tingling   . Adenomatous colon polyp   . Migraine   . Rosacea     PAST SURGICAL HISTORY: Past Surgical History  Procedure Laterality Date  . Oophorectomy    . Cystectomy    . Milk gland removal    . Vaginal hysterectomy    . Laparoscopy    . Esophageal manometry N/A 10/22/2012    Procedure: ESOPHAGEAL MANOMETRY (EM);  Surgeon:  Sable Feil, MD;  Location: [facility];  Service: Endoscopy;  Laterality: N/A;  . Esophagogastroduodenoscopy  10/08/2010  . Colonoscopy  10/08/2010    Normal--multiple   . Dilation and curettage of uterus      FAMILY HISTORY: Family History  Problem Relation Age of Onset  . Heart attack Mother   . Bladder Cancer Mother   . Heart disease Mother   . Pancreatic cancer Paternal Grandfather   . Diabetes Paternal Grandmother   . Stroke Paternal Grandmother   . Colon cancer Neg Hx   . Bladder Cancer Father     SOCIAL HISTORY: Social History   Social History  . Marital Status: Married    Spouse Name: N/A  . Number of Children: 3  . Years of Education: 14   Occupational History  . clerk of social services    Social History Main Topics  .  Smoking status: Former Smoker -- 0.50 packs/day for 16 years    Types: Cigarettes    Quit date: 08/09/1987  . Smokeless tobacco: Never Used  . Alcohol Use: 0.0 oz/week    0 Standard drinks or equivalent per week     Comment: Occasionally  . Drug Use: No  . Sexual Activity: Not on file   Other Topics Concern  . Not on file   Social History Narrative   Lives at home with husband.   Right-handed.    1 cup caffeine per day.     PHYSICAL EXAM  Filed Vitals:   09/28/15 0932  BP: 130/68  Pulse: 72  Height: _0  (1.676 m)  Weight: 168 lb (76.204 kg)   Body mass index is 27.13 kg/(m^2). Gen: NAD, conversant, well nourised, obese, well groomed  Cardiovascular: Regular rate rhythm,  Neck: Supple, no carotid bruit.  NEUROLOGICAL EXAM:  MENTAL STATUS: Speech:Speech is normal; fluent and spontaneous with normal comprehension.  Cognition:The patient is oriented to person, place, and time;  recent and remote memory intact;  language fluent;  normal attention, concentration,  fund of knowledge.  CRANIAL NERVES: CN II: Visual fields are full to confrontation.  Pupils were equal round reactive to light CN  III, IV, VI: extraocular movement are normal. No ptosis. CN V: Facial sensation is intact to pinprick in all 3 divisions bilaterally.   CN VII: Face is symmetric with normal eye closure and smile. CN VIII: Hearing is normal to rubbing fingers CN IX, X: Palate elevates symmetrically. Phonation is normal. CN XI: Head turning and shoulder shrug are intact CN XII: Tongue is midline with normal movements and no atrophy.  MOTOR:There is no pronator drift of out-stretched arms. Muscle bulk and tone are normal. Muscle strength is normal. REFLEXES:Reflexes are 2+ and symmetric at the biceps, triceps, knees, and ankles. Plantar responses are flexor. SENSORY:Light touch, pinprick, position sense, and vibration sense are intact in fingers and toes. COORDINATION:Rapid alternating movements and fine finger movements are intact. There is no dysmetria on finger-to-nose and heel-knee-shin. There are no abnormal or extraneous movements.  GAIT/STANCE:Posture is normal. Gait is steady with normal steps, base, arm swing, and turning. Heel and toe walking are normal. Tandem gait is normal.  Romberg is absent.  DIAGNOSTIC DATA (LABS, IMAGING, TESTING) - I reviewed patient records, labs, notes, testing and imaging myself where available.  Lab Results  Component Value Date   WBC 7.1 04/22/2015   HGB 14.7 04/22/2015   HCT 43.8 04/22/2015   MCV 92.6 04/22/2015   PLT 202 04/22/2015      Component Value Date/Time   NA 138 04/22/2015 0809   NA 137 04/16/2014 1037   K 4.5 04/22/2015 0809   K 4.8 04/16/2014 1037   CL 100 04/16/2014 1037   CO2 28 04/22/2015 0809   CO2 30 04/16/2014 1037   GLUCOSE 99 04/22/2015 0809   GLUCOSE 88 04/16/2014 1037   BUN 10.8 04/22/2015 0809   BUN 9 04/16/2014 1037   CREATININE 0.8 04/22/2015 0809   CREATININE 0.7 04/16/2014 1037   CALCIUM 10.7* 04/22/2015 0809   CALCIUM 10.7* 04/16/2014 1037   PROT 6.9 04/22/2015 0809   PROT 7.5 04/16/2014 1037   ALBUMIN 4.0 04/22/2015 0809    ALBUMIN 4.0 04/16/2014 1037   AST 17 04/22/2015 0809   AST 22 04/16/2014 1037   ALT 17 04/22/2015 0809   ALT 26 04/16/2014 1037   ALKPHOS 100 04/22/2015 0809   ALKPHOS 84 04/16/2014 1037  BILITOT 0.42 04/22/2015 0809   BILITOT 0.6 04/16/2014 1037   GFRNONAA 92.12 10/06/2009 1007   GFRAA  10/29/2008 0614    >60        The eGFR has been calculated using the MDRD equation. This calculation has not been validated in all clinical situations. eGFR's persistently <60 mL/min signify possible Chronic Kidney Disease.      ASSESSMENT AND PLAN  60 y.o. year old female  has a past medical history of  chronic migraine, depression and anxiety and some daytime sleepiness. She has tried nortriptyline which caused mood swings. Propanolol was not helpful and she reports that her migraines are actually better at present having no more than 1 per week which are relieved with Imitrex  Given information on migraine triggers and reviewed these,  aswered  questions Continue Imitrex as ordered as ordered will refill Follow up 6 months Dennie Bible, [facility], [facility], [facility] [address], [place], [place] [address] [phone]

## 2015-10-20 ENCOUNTER — Other Ambulatory Visit (HOSPITAL_BASED_OUTPATIENT_CLINIC_OR_DEPARTMENT_OTHER): Payer: BLUE CROSS/BLUE SHIELD

## 2015-10-20 ENCOUNTER — Telehealth: Payer: Self-pay | Admitting: Oncology

## 2015-10-20 ENCOUNTER — Ambulatory Visit (HOSPITAL_BASED_OUTPATIENT_CLINIC_OR_DEPARTMENT_OTHER): Payer: BLUE CROSS/BLUE SHIELD | Admitting: Oncology

## 2015-10-20 VITALS — BP 155/85 | HR 63 | Temp 97.7°F | Resp 16 | Wt 167.4 lb

## 2015-10-20 DIAGNOSIS — R918 Other nonspecific abnormal finding of lung field: Secondary | ICD-10-CM

## 2015-10-20 DIAGNOSIS — R209 Unspecified disturbances of skin sensation: Secondary | ICD-10-CM

## 2015-10-20 DIAGNOSIS — R269 Unspecified abnormalities of gait and mobility: Secondary | ICD-10-CM

## 2015-10-20 DIAGNOSIS — R200 Anesthesia of skin: Secondary | ICD-10-CM | POA: Diagnosis not present

## 2015-10-20 DIAGNOSIS — D509 Iron deficiency anemia, unspecified: Secondary | ICD-10-CM

## 2015-10-20 LAB — IRON AND TIBC
%SAT: 39 % (ref 21–57)
IRON: 131 ug/dL (ref 41–142)
TIBC: 335 ug/dL (ref 236–444)
UIBC: 204 ug/dL (ref 120–384)

## 2015-10-20 LAB — CBC WITH DIFFERENTIAL/PLATELET
BASO%: 1 % (ref 0.0–2.0)
Basophils Absolute: 0.1 10*3/uL (ref 0.0–0.1)
EOS%: 1.5 % (ref 0.0–7.0)
Eosinophils Absolute: 0.1 10*3/uL (ref 0.0–0.5)
HCT: 46.5 % (ref 34.8–46.6)
HGB: 15.3 g/dL (ref 11.6–15.9)
LYMPH#: 2.6 10*3/uL (ref 0.9–3.3)
LYMPH%: 39 % (ref 14.0–49.7)
MCH: 30.3 pg (ref 25.1–34.0)
MCHC: 33 g/dL (ref 31.5–36.0)
MCV: 92 fL (ref 79.5–101.0)
MONO#: 0.6 10*3/uL (ref 0.1–0.9)
MONO%: 8.7 % (ref 0.0–14.0)
NEUT#: 3.4 10*3/uL (ref 1.5–6.5)
NEUT%: 49.8 % (ref 38.4–76.8)
Platelets: 216 10*3/uL (ref 145–400)
RBC: 5.06 10*6/uL (ref 3.70–5.45)
RDW: 12.8 % (ref 11.2–14.5)
WBC: 6.8 10*3/uL (ref 3.9–10.3)

## 2015-10-20 LAB — FERRITIN: Ferritin: 52 ng/ml (ref 9–269)

## 2015-10-20 NOTE — Telephone Encounter (Signed)
Gave and printed appt sched and avs for pt for SEpt °

## 2015-10-20 NOTE — Progress Notes (Signed)
Hematology and Oncology Follow Up Visit  [name] [id number] 10/22/1955 60 y.o. 10/20/2015 8:35 AM [name], MDSettle, Hall Busing, MD   Principle Diagnosis: 60 year old woman with iron deficiency anemia diagnosed in September 2015. She had a prior level of 30 and saturation of 6.1%. Her hemoglobin was 11.3.   Prior Therapy: IV iron in the form of Feraheme given on February 22 and February 29 of 2016 for a total of 1020 mg.  Current therapy: Oral iron replacement.  Interim History: [name] presents today for a follow-up visit. Since the last visit, she reports no major changes in her health. Her energy continues to improve and able to perform activities of daily living. She has been exercising regularly including a running and jogging without any decline in  her exercise tolerance.  She does not report any hematochezia or bright blood per rectum. She does not report any hemoptysis. She is able to tolerate oral iron without any complications. She denied any constipation or dyspepsia.   She does report occasional dizziness and headaches but no syncope or seizures she does not report any fevers, chills or sweats. She does not report any weight loss or appetite changes. She does not report any cough or hemoptysis. She does not report any chest pain, palpitation orthopnea. She does not report any nausea, vomiting or abdominal pain. She does not report any genitourinary complaints. Remaining review of systems unremarkable.  Medications: I have reviewed the patient's current medications.  Current Outpatient Prescriptions  Medication Sig Dispense Refill  . chlorpheniramine-HYDROcodone (TUSSIONEX) 10-8 MG/5ML SUER As directed  0  . clonazePAM (KLONOPIN) 0.5 MG tablet Take 1 tablet (0.5 mg total) by mouth 2 (two) times daily as needed. 180 tablet 2  . dicyclomine (BENTYL) 20 MG tablet Take 1 tablet (20 mg total) by mouth 2 (two) times daily. (Patient taking differently: Take 20 mg by mouth 2 (two)  times daily as needed. ) 60 tablet 1  . escitalopram (LEXAPRO) 20 MG tablet Take 20 mg by mouth daily.      . hyoscyamine (LEVSIN, ANASPAZ) 0.125 MG tablet Take 1 tablet (0.125 mg total) by mouth as needed. 180 tablet 3  . Iron-Vitamin C (VITRON-C PO) Take 1 capsule by mouth daily.    . meloxicam (MOBIC) 15 MG tablet Take 1 tablet (15 mg total) by mouth daily as needed for pain (take with food.). 60 tablet 1  . metroNIDAZOLE (METROCREAM) 0.75 % cream Apply topically 2 (two) times daily.    . ondansetron (ZOFRAN ODT) 4 MG disintegrating tablet Take 1 tablet (4 mg total) by mouth every 8 (eight) hours as needed for nausea or vomiting. 30 tablet 5  . OVER THE COUNTER MEDICATION Cleanse More- 2 capsules daily    . pantoprazole (PROTONIX) 40 MG tablet Take 1 tablet (40 mg total) by mouth daily. 90 tablet 3  . Red Yeast Rice Extract (RED YEAST RICE PO) Take by mouth. 2 caps po at bedtime.    . SUMAtriptan (IMITREX) 50 MG tablet Take 1 tablet (50 mg total) by mouth as needed for migraine. May repeat in 2 hours if headache persists or recurs. 27 tablet 2   No current facility-administered medications for this visit.     Allergies:  Allergies  Allergen Reactions  . Aspirin Nausea Only    Past Medical History, Surgical history, Social history, and Family History were reviewed and updated.   Physical Exam: Blood pressure 155/85, pulse 63, temperature 97.7 F (36.5 C), resp. rate 16,  weight 167 lb 7 oz (75.949 kg), SpO2 100 %. ECOG: 0 General appearance: alert and cooperative not in any distress. Head: Normocephalic, without obvious abnormality no oral ulcers or lesions. Neck: no adenopathy Lymph nodes: Cervical, supraclavicular, and axillary nodes normal. Heart:regular rate and rhythm, S1, S2 normal, no murmur, click, rub or gallop Lung:chest clear, no wheezing, rales, normal symmetric air entry Abdomin: soft, non-tender, without masses or organomegaly no shifting dullness or ascites. EXT:no  erythema, induration, or nodules   Lab Results: Lab Results  Component Value Date   WBC 6.8 10/20/2015   HGB 15.3 10/20/2015   HCT 46.5 10/20/2015   MCV 92.0 10/20/2015   PLT 216 10/20/2015     Chemistry      Component Value Date/Time   NA 138 04/22/2015 0809   NA 137 04/16/2014 1037   K 4.5 04/22/2015 0809   K 4.8 04/16/2014 1037   CL 100 04/16/2014 1037   CO2 28 04/22/2015 0809   CO2 30 04/16/2014 1037   BUN 10.8 04/22/2015 0809   BUN 9 04/16/2014 1037   CREATININE 0.8 04/22/2015 0809   CREATININE 0.7 04/16/2014 1037      Component Value Date/Time   CALCIUM 10.7* 04/22/2015 0809   CALCIUM 10.7* 04/16/2014 1037   ALKPHOS 100 04/22/2015 0809   ALKPHOS 84 04/16/2014 1037   AST 17 04/22/2015 0809   AST 22 04/16/2014 1037   ALT 17 04/22/2015 0809   ALT 26 04/16/2014 1037   BILITOT 0.42 04/22/2015 0809   BILITOT 0.6 04/16/2014 1037       Impression and Plan:   60 year old with the following issues:  1. Iron deficiency anemia documented by a hemoglobin of 11.3 and ferritin of 4.7. He is status post IV iron replacement with her hemoglobin normalizing after the completion of infusion in February 2016.  Her hemoglobin and iron studies continues to be normal with oral replacement. She has tolerated it well and the plan is to keep her on oral supplements and recheck her count in 6 months.   2. Facial numbness and gait disturbance: Her neurological workup is unrevealing. Her MRI was reviewed and showed no acute abnormalities. She follows up with neurology regarding this issue.  3. Pulmonary nodule: She had a CT scan of the chest on 02/16/2015 and that was reviewed today. There is a small pulmonary nodule that is compared to previous exam and these findings are compatible with benign findings. There is also a 3 mm lower lobe lung nodule but the patient is not a high risk for bronchogenic carcinoma. No further workup is needed.  4. Age-appropriate cancer screening: She is  up-to-date on colon cancer screening as well as mammography.  5. Follow-up will be in 6 months.    [facility], MD 3/14/20178:35 AM

## 2016-01-29 ENCOUNTER — Other Ambulatory Visit: Payer: Self-pay | Admitting: Internal Medicine

## 2016-01-29 NOTE — Telephone Encounter (Signed)
-----   Message -----    From: Jerene Bears, MD    Sent: 01/29/2016   2:48 PM      To: Larina Bras, CMA  Can send ----- Message -----    From: Larina Bras, CMA    Sent: 01/29/2016   1:58 PM      To: Jerene Bears, MD  Patient requests refills of Amitiza. Your last note indicates that she is using OTC laxatives with success, so no amitiza use. Can I go ahead and fill Amitiza since OTC's are no longer working or do you have additional thoughts?

## 2016-02-02 ENCOUNTER — Other Ambulatory Visit: Payer: Self-pay | Admitting: Internal Medicine

## 2016-02-02 DIAGNOSIS — R918 Other nonspecific abnormal finding of lung field: Secondary | ICD-10-CM

## 2016-02-10 ENCOUNTER — Telehealth: Payer: Self-pay | Admitting: Internal Medicine

## 2016-02-10 NOTE — Telephone Encounter (Signed)
Called to precertify for CT chest for 02/16/16 and informed that "her case is closed" as of 02/05/16 by insurance clerk who offered no suggestions on how to get this done.  This pt is low risk for Ca but does have a remote smoking hx and a new 3 mm nodule one year ago so is not "no risk" and gave the insurance my name and cell phone number to talk peer to peer about her case but she declined the offer.  Returned request to [facility] to sort out what is needed

## 2016-02-16 ENCOUNTER — Inpatient Hospital Stay: Admission: RE | Admit: 2016-02-16 | Payer: BLUE CROSS/BLUE SHIELD | Source: Ambulatory Visit

## 2016-02-22 ENCOUNTER — Ambulatory Visit (INDEPENDENT_AMBULATORY_CARE_PROVIDER_SITE_OTHER)
Admission: RE | Admit: 2016-02-22 | Discharge: 2016-02-22 | Disposition: A | Discharge status: Home / Self Care | Payer: BLUE CROSS/BLUE SHIELD | Source: Ambulatory Visit | Attending: Internal Medicine | Admitting: Internal Medicine

## 2016-02-22 ENCOUNTER — Encounter: Payer: Self-pay | Admitting: Sports Medicine

## 2016-02-22 ENCOUNTER — Ambulatory Visit (INDEPENDENT_AMBULATORY_CARE_PROVIDER_SITE_OTHER): Payer: BLUE CROSS/BLUE SHIELD | Admitting: Sports Medicine

## 2016-02-22 VITALS — BP 131/53 | Ht 66.0 in | Wt 165.0 lb

## 2016-02-22 DIAGNOSIS — M2241 Chondromalacia patellae, right knee: Secondary | ICD-10-CM

## 2016-02-22 DIAGNOSIS — M2240 Chondromalacia patellae, unspecified knee: Secondary | ICD-10-CM | POA: Insufficient documentation

## 2016-02-22 DIAGNOSIS — R918 Other nonspecific abnormal finding of lung field: Secondary | ICD-10-CM | POA: Diagnosis not present

## 2016-02-22 DIAGNOSIS — M47819 Spondylosis without myelopathy or radiculopathy, site unspecified: Secondary | ICD-10-CM | POA: Diagnosis not present

## 2016-02-22 NOTE — Assessment & Plan Note (Addendum)
Likely due in part to osteoarthritis, with 2+ crepitus noted on physical exam -Provided exercises for strengthening of knee -Mobic as needed for pain relief -Follow up in 4-6 weeks, could consider imaging at that point
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
## 2016-02-22 NOTE — Patient Instructions (Addendum)
Knee pain likely due to chondromalacia patella. Can use mobic for inflammation as needed. Please try exercises as discussed. For the back pain, continue stretching your back for improvement.  Follow up in 4-6 weeks

## 2016-02-22 NOTE — Assessment & Plan Note (Signed)
Likely due to osteoarthritis. -Provided strengthening exercises -Follow up in 4-6 weeks, consider further imaging at that point

## 2016-02-23 NOTE — Progress Notes (Signed)
Quick Note:  Spoke with pt and notified of results per Dr. Wert. Pt verbalized understanding and denied any questions.  ______ 

## 2016-03-28 ENCOUNTER — Ambulatory Visit (INDEPENDENT_AMBULATORY_CARE_PROVIDER_SITE_OTHER): Payer: BLUE CROSS/BLUE SHIELD | Admitting: Nurse Practitioner

## 2016-03-28 ENCOUNTER — Encounter: Payer: Self-pay | Admitting: Nurse Practitioner

## 2016-03-28 ENCOUNTER — Ambulatory Visit: Payer: BLUE CROSS/BLUE SHIELD | Admitting: Sports Medicine

## 2016-03-28 VITALS — BP 152/82 | HR 60 | Ht 66.0 in | Wt 170.0 lb

## 2016-03-28 DIAGNOSIS — G43909 Migraine, unspecified, not intractable, without status migrainosus: Secondary | ICD-10-CM | POA: Diagnosis not present

## 2016-03-28 MED ORDER — TOPIRAMATE 25 MG PO TABS: 25.0000 mg | ORAL_TABLET | Freq: Two times a day (BID) | ORAL | 3 refills | Status: DC

## 2016-03-28 MED ORDER — SUMATRIPTAN SUCCINATE 50 MG PO TABS: 50.0000 mg | ORAL_TABLET | ORAL | 2 refills | Status: DC | PRN

## 2016-03-28 NOTE — Progress Notes (Signed)
I have reviewed and agreed above plan. 

## 2016-03-28 NOTE — Progress Notes (Signed)
[facility]  PATIENT: [name] DOB: [birth year]   REASON FOR VISIT: Follow-up for migraine HISTORY FROM: Patient    HISTORY OF PRESENT ILLNESS: HISTORY: [name] is a 60 yo RH female, referred by her PCP Dr. Sherrlyn Hock for evaluation of unsteady gait, left facial numbness,  She has past medical history of migraine, depression anxiety. In November 25 2014 she woke up from overnight sleep, feeling well, but when she tried to get up, she felt unsteady gait, veered to her left side, lasting for 1 hour, she also noticed severe left-sided pounding headaches, with left facial numbness, She was evaluated by her primary care the same day, CAT scan of the brain was normal, I have personally reviewed the film Laboratory evaluation showed normal CBC, CMP, elevated LDL 183, cholesterol 278 She also had normal ultrasound of carotid artery, She reported long-standing history of migraine, much improved since she one time protein free diet since 2009.  UPDATE June 15th 2016:YYShe had nerve block for prologned severe headache in April 28th 2016 for prolonged headaches, which has been very helpful. She had 2 bad headaches since last visit in April 28th, lasting for 2-3 days, could not functions, tramadol helps her some, but only temporarily, also caused constipation I have personally reviewed MRI of the brain in Jan 03 2015 that was normal.. She still complains of daily headache, bilateral frontal area pressure headaches, few times each week, exacerbated into a more severe headaches, with large tinnitus, light noise smell sensitivity, lasting hours to days, Trigger for her headaches are stress, weather change, certain food, such as onion UPDATE August 23 2016YY:She continue has mild daily pressure headaches, couple times a week it would be exacerbated to moderate sometimes severe headaches with associated light noise sensitivity, Maxalt used to be helpful, no significant side effect,  but in the past 2 weeks, Maxalt has become less effective She also complains of depression anxiety, she is under a lot of stress, nortriptyline 20 mg has caused her mood swing, has stopping March 18 2015, she has mild improvement in her mood UPDATE 09/28/2015 [name], 60 year old female returns for follow-up she has a history of migraines and was last seen in the office by Dr. Krista Blue 03/31/2015. She has failed nortriptyline in the past and was placed on propranolol at her last visit however she is not longer taking the medication. Imitrex works acutely. She has about 1 headache per week or less presently. She feels her headaches are in better control. She is not aware of any migraine triggers. She returns for reevaluation UPDATE 08/21/2017CM [name], 60 year old female returns for follow-up. She has history of migraine headaches which have worsened since May. She was having about 1 headache a week when last seen in February. She claims she is having about 4-5 headaches per week. She has failed nortriptyline and Inderal in the past. She currently takes Imitrex that works acutely. She returns for reevaluation. She is not aware of any specific migraine triggers  REVIEW OF SYSTEMS: Full 14 system review of systems performed and notable only for those listed, all others are neg:  Constitutional: Fatigue  Cardiovascular: neg Ear/Nose/Throat: Ringing in the ears Skin: neg Eyes: Light sensitivity Respiratory: neg Gastroitestinal: neg  Hematology/Lymphatic: neg  Endocrine: neg Musculoskeletal: Joint pain Allergy/Immunology: Environmental allergies ,food allergies Neurological: Headache Psychiatric: Depression and anxiety Sleep : Snoring   ALLERGIES: Allergies  Allergen Reactions  . Aspirin Nausea Only    HOME MEDICATIONS: Outpatient Medications Prior to Visit  Medication Sig Dispense Refill  . clonazePAM (KLONOPIN) 0.5 MG tablet Take 1 tablet (0.5 mg total) by mouth 2 (two) times daily as  needed. 180 tablet 2  . cyclobenzaprine (FLEXERIL) 10 MG tablet   1  . dicyclomine (BENTYL) 20 MG tablet Take 1 tablet (20 mg total) by mouth 2 (two) times daily. (Patient taking differently: Take 20 mg by mouth 2 (two) times daily as needed. ) 60 tablet 1  . escitalopram (LEXAPRO) 20 MG tablet Take 20 mg by mouth daily.      . hyoscyamine (LEVSIN, ANASPAZ) 0.125 MG tablet Take 1 tablet (0.125 mg total) by mouth as needed. 180 tablet 3  . meloxicam (MOBIC) 15 MG tablet Take 1 tablet (15 mg total) by mouth daily as needed for pain (take with food.). 60 tablet 1  . ondansetron (ZOFRAN ODT) 4 MG disintegrating tablet Take 1 tablet (4 mg total) by mouth every 8 (eight) hours as needed for nausea or vomiting. 30 tablet 5  . OVER THE COUNTER MEDICATION Cleanse More- 2 capsules daily    . pantoprazole (PROTONIX) 40 MG tablet Take 1 tablet (40 mg total) by mouth daily. 90 tablet 3  . Red Yeast Rice Extract (RED YEAST RICE PO) Take by mouth. 2 caps po at bedtime.    . SUMAtriptan (IMITREX) 50 MG tablet Take 1 tablet (50 mg total) by mouth as needed for migraine. May repeat in 2 hours if headache persists or recurs. 27 tablet 2  . Vitamin D, Ergocalciferol, (DRISDOL) 50000 units CAPS capsule   2  . AMITIZA 24 MCG capsule TAKE 1 CAPSULE BY MOUTH TWICE A DAY WITH A MEAL * PT NEEDS OFFICE VISIT FOR FURTHER REFILLS* (Patient not taking: Reported on 03/28/2016) 60 capsule 2  . chlorpheniramine-HYDROcodone (TUSSIONEX) 10-8 MG/5ML SUER As directed  0  . Iron-Vitamin C (VITRON-C PO) Take 1 capsule by mouth daily.    . metroNIDAZOLE (METROCREAM) 0.75 % cream Apply topically 2 (two) times daily.     No facility-administered medications prior to visit.     PAST MEDICAL HISTORY: Past Medical History:  Diagnosis Date  . Adenomatous colon polyp   . Allergy    SEASONAL  . Anemia   . Anxiety   . Arthritis    BILATERAL HANDS,TOES ON RT. FOOT  . Bladder disease   . Chronic interstitial cystitis   . Depressive  disorder, not elsewhere classified   . Esophageal reflux   . Fundic gland polyps of stomach, benign   . GERD (gastroesophageal reflux disease)   . Hemorrhoids   . Hepatic cyst   . Hyperlipidemia   . Irritable bowel syndrome   . Migraine   . Numbness and tingling   . Pulmonary nodules   . Rosacea   . Syncope   . Unspecified constipation   . Unsteady gait    Lips and chin    PAST SURGICAL HISTORY: Past Surgical History:  Procedure Laterality Date  . COLONOSCOPY  10/08/2010   Normal--multiple   . CYSTECTOMY    . DILATION AND CURETTAGE OF UTERUS    . ESOPHAGEAL MANOMETRY N/A 10/22/2012   Procedure: ESOPHAGEAL MANOMETRY (EM);  Surgeon: Sable Feil, MD;  Location: [facility];  Service: Endoscopy;  Laterality: N/A;  . ESOPHAGOGASTRODUODENOSCOPY  10/08/2010  . LAPAROSCOPY    . milk gland removal    . OOPHORECTOMY    . VAGINAL HYSTERECTOMY      FAMILY HISTORY: Family History  Problem Relation Age of Onset  . Heart  attack Mother   . Bladder Cancer Mother   . Heart disease Mother   . Pancreatic cancer Paternal Grandfather   . Diabetes Paternal Grandmother   . Stroke Paternal Grandmother   . Colon cancer Neg Hx   . Bladder Cancer Father     SOCIAL HISTORY: Social History   Social History  . Marital status: Married    Spouse name: N/A  . Number of children: 3  . Years of education: 14   Occupational History  . clerk of social services [employer]  . Smoking status: Former Smoker    Packs/day: 0.50    Years: 16.00    Types: Cigarettes    Quit date: 08/09/1987  . Smokeless tobacco: Never Used  . Alcohol use 0.0 oz/week     Comment: Occasionally  . Drug use: No  . Sexual activity: Not on file   Other Topics Concern  . Not on file   Social History Narrative   Lives at home with husband.   Right-handed.    1 cup caffeine per day.     PHYSICAL EXAM  Vitals:   03/28/16 0751  BP: (!) 152/82  Pulse: 60  Weight:  170 lb (77.1 kg)  Height: 5\' 6"  (1.676 m)   Body mass index is 27.44 kg/m. Gen: NAD, conversant, well nourised, obese, well groomed  Cardiovascular: Regular rate rhythm,  Neck: Supple, no carotid bruit.  NEUROLOGICAL EXAM:  MENTAL STATUS: Speech:Speech is normal; fluent and spontaneous with normal comprehension.  Cognition:The patient is oriented to person, place, and time;  recent and remote memory intact;  language fluent;  normal attention, concentration,  fund of knowledge.  CRANIAL NERVES: CN II: Visual fields are full to confrontation.  Pupils were equal round reactive to light CN III, IV, VI: extraocular movement are normal. No ptosis. CN V: Facial sensation is intact to pinprick in all 3 divisions bilaterally.   CN VII: Face is symmetric with normal eye closure and smile. CN VIII: Hearing is normal to rubbing fingers CN IX, X: Palate elevates symmetrically. Phonation is normal. CN XI: Head turning and shoulder shrug are intact CN XII: Tongue is midline with normal movements and no atrophy.  MOTOR:There is no pronator drift of out-stretched arms. Muscle bulk and tone are normal. Muscle strength is normal. REFLEXES:Reflexes are 2+ and symmetric at the biceps, triceps, knees, and ankles. Plantar responses are flexor. SENSORY:Light touch, pinprick, position sense, and vibration sense are intact in fingers and toes. COORDINATION:Rapid alternating movements and fine finger movements are intact. There is no dysmetria on finger-to-nose and heel-knee-shin. There are no abnormal or extraneous movements.  GAIT/STANCE:Posture is normal. Gait is steady with normal steps, base, arm swing, and turning. Heel and toe walking are normal. Tandem gait is normal.  Romberg is negative  DIAGNOSTIC DATA (LABS, IMAGING, TESTING) - I reviewed patient records, labs, notes, testing and imaging myself where available.  Lab Results  Component Value Date   WBC 6.8 10/20/2015    HGB 15.3 10/20/2015   HCT 46.5 10/20/2015   MCV 92.0 10/20/2015   PLT 216 10/20/2015       ASSESSMENT AND PLAN  60 y.o. year old female  has a past medical history of  chronic migraine, depression and anxiety and some daytime sleepiness. She has tried nortriptyline which caused mood swings. Propanolol was not helpful and she reports that her migraines are worse since May. Presently  having 4-5 headaches per  week .  PLAN: Topamax 25 mg at night for one week then increase to 1 tablet twice daily Given patient information on the most common side effects which include weight loss, kidney stone, eye pain and paresthesias. Increase water  intake with this medication Continue Imitrex will refill Keep a record of your headaches and bring to the next visit Discussed migraine triggers Follow-up in 3 months Dennie Bible, [facility], [facility], [facility] [address] [place], [place] [address] [phone]

## 2016-03-28 NOTE — Patient Instructions (Signed)
Topamax 25 mg at night for one week then increase to 1 tablet twice daily Continue Imitrex will refill Follow-up in 3 months

## 2016-04-19 ENCOUNTER — Encounter: Payer: Self-pay | Admitting: Sports Medicine

## 2016-04-19 ENCOUNTER — Ambulatory Visit (HOSPITAL_BASED_OUTPATIENT_CLINIC_OR_DEPARTMENT_OTHER): Payer: BLUE CROSS/BLUE SHIELD | Admitting: Oncology

## 2016-04-19 ENCOUNTER — Ambulatory Visit (INDEPENDENT_AMBULATORY_CARE_PROVIDER_SITE_OTHER): Payer: BLUE CROSS/BLUE SHIELD | Admitting: Sports Medicine

## 2016-04-19 ENCOUNTER — Other Ambulatory Visit (HOSPITAL_BASED_OUTPATIENT_CLINIC_OR_DEPARTMENT_OTHER): Payer: BLUE CROSS/BLUE SHIELD

## 2016-04-19 VITALS — BP 114/81 | HR 70 | Ht 66.0 in | Wt 166.0 lb

## 2016-04-19 VITALS — BP 142/80 | HR 69 | Temp 98.5°F | Resp 18 | Wt 166.4 lb

## 2016-04-19 DIAGNOSIS — D509 Iron deficiency anemia, unspecified: Secondary | ICD-10-CM | POA: Diagnosis not present

## 2016-04-19 DIAGNOSIS — R209 Unspecified disturbances of skin sensation: Secondary | ICD-10-CM

## 2016-04-19 DIAGNOSIS — M2021 Hallux rigidus, right foot: Secondary | ICD-10-CM | POA: Diagnosis not present

## 2016-04-19 DIAGNOSIS — R918 Other nonspecific abnormal finding of lung field: Secondary | ICD-10-CM

## 2016-04-19 DIAGNOSIS — R269 Unspecified abnormalities of gait and mobility: Secondary | ICD-10-CM | POA: Diagnosis not present

## 2016-04-19 LAB — CBC WITH DIFFERENTIAL/PLATELET
BASO%: 0.9 % (ref 0.0–2.0)
Basophils Absolute: 0.1 10*3/uL (ref 0.0–0.1)
EOS%: 0.9 % (ref 0.0–7.0)
Eosinophils Absolute: 0.1 10*3/uL (ref 0.0–0.5)
HCT: 45.2 % (ref 34.8–46.6)
HGB: 15.1 g/dL (ref 11.6–15.9)
LYMPH#: 1.8 10*3/uL (ref 0.9–3.3)
LYMPH%: 26.9 % (ref 14.0–49.7)
MCH: 30.5 pg (ref 25.1–34.0)
MCHC: 33.5 g/dL (ref 31.5–36.0)
MCV: 91.2 fL (ref 79.5–101.0)
MONO#: 0.5 10*3/uL (ref 0.1–0.9)
MONO%: 7.3 % (ref 0.0–14.0)
NEUT%: 64 % (ref 38.4–76.8)
NEUTROS ABS: 4.4 10*3/uL (ref 1.5–6.5)
PLATELETS: 216 10*3/uL (ref 145–400)
RBC: 4.96 10*6/uL (ref 3.70–5.45)
RDW: 13 % (ref 11.2–14.5)
WBC: 6.9 10*3/uL (ref 3.9–10.3)

## 2016-04-19 LAB — IRON AND TIBC
%SAT: 29 % (ref 21–57)
Iron: 103 ug/dL (ref 41–142)
TIBC: 351 ug/dL (ref 236–444)
UIBC: 248 ug/dL (ref 120–384)

## 2016-04-19 LAB — FERRITIN: Ferritin: 54 ng/ml (ref 9–269)

## 2016-04-19 NOTE — Progress Notes (Signed)
   Subjective:    Patient ID: [name], female    DOB: 07-Aug-1956, 60 y.o.   MRN: [id number]  HPI chief complaint: Right foot pain  Patient comes in today complaining of long-standing pain in her right foot. She localizes the pain to the first MTP joint. No recent trauma. Her pain is most noticeable when wearing closed toed shoes that put pressure over this area. Pain improves when going barefoot or wearing sandals. She has noticed limited range of motion. She has not had any specific workup or treatment for this condition. She does have a history of osteoarthritis in other parts of her body. She takes 15 mg of meloxicam on an as-needed basis and does find this to be helpful. She denies pain elsewhere in the foot. No associated numbness or tingling. No swelling. She is here today with her husband.  Past medical history reviewed Medications reviewed Allergies reviewed    Review of Systems As above    Objective:   Physical Exam  Well-developed, well-nourished. No acute distress. Vital signs reviewed  Right foot: Examination of the right foot with attention to the right MTP joint shows bony hypertrophy consistent with osteoarthritis. Mild tenderness to palpation. No erythema. No joint effusion. No soft tissue swelling. Extreme hallux rigidus with passive range of motion. Neurovasculaly intact distally. Walking with a slight limp.      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 We are going to forego x-rays at this time and her pain is not severe enough to consider cortisone injection. I do think we should try and insert with a first ray post to try to limit some of the motion at that first MTP joint. She will return to the office at a time that is convenient for her to have her walking shoes fitted with this insert. We can obviously get more  aggressive with workup and treatment at a later date if needed.

## 2016-04-19 NOTE — Progress Notes (Signed)
Hematology and Oncology Follow Up Visit  [name] [id number] 03-19-1956 60 y.o. 04/19/2016 10:22 AM [name], MDSettle, Hall Busing, MD   Principle Diagnosis: 60 year old woman with iron deficiency anemia diagnosed in September 2015. She had a prior level of 30 and saturation of 6.1%. Her hemoglobin was 11.3.   Prior Therapy: IV iron in the form of Feraheme given on February 22 and February 29 of 2016 for a total of 1020 mg.  Current therapy: Oral iron replacement which has been discontinued in the last few months.  Interim History: [name] presents today for a follow-up visit. Since the last visit, she reports feeling reasonably well. She continues to have issues with migraine headaches and have been on Topamax for preventative purposes. She is concerned about side effects associated with this medication including fatigue and visual problems.  Her energy continues to improve and able to perform activities of daily living. She does not report any hematochezia or bright blood per rectum. She does not report any hemoptysis. She is able to tolerate oral iron without any complications. She denied any constipation or dyspepsia.   She does report occasional dizziness and headaches but no syncope or seizures she does not report any fevers, chills or sweats. She does not report any weight loss or appetite changes. She does not report any cough or hemoptysis. She does not report any chest pain, palpitation orthopnea. She does not report any nausea, vomiting or abdominal pain. She does not report any genitourinary complaints. Remaining review of systems unremarkable.  Medications: I have reviewed the patient's current medications.  Current Outpatient Prescriptions  Medication Sig Dispense Refill  . clonazePAM (KLONOPIN) 0.5 MG tablet Take 1 tablet (0.5 mg total) by mouth 2 (two) times daily as needed. 180 tablet 2  . cyclobenzaprine (FLEXERIL) 10 MG tablet   1  . dicyclomine (BENTYL) 20 MG  tablet Take 1 tablet (20 mg total) by mouth 2 (two) times daily. (Patient taking differently: Take 20 mg by mouth 2 (two) times daily as needed. ) 60 tablet 1  . escitalopram (LEXAPRO) 20 MG tablet Take 20 mg by mouth daily.      . hyoscyamine (LEVSIN, ANASPAZ) 0.125 MG tablet Take 1 tablet (0.125 mg total) by mouth as needed. 180 tablet 3  . meloxicam (MOBIC) 15 MG tablet Take 1 tablet (15 mg total) by mouth daily as needed for pain (take with food.). 60 tablet 1  . ondansetron (ZOFRAN ODT) 4 MG disintegrating tablet Take 1 tablet (4 mg total) by mouth every 8 (eight) hours as needed for nausea or vomiting. 30 tablet 5  . OVER THE COUNTER MEDICATION Cleanse More- 2 capsules daily    . pantoprazole (PROTONIX) 40 MG tablet Take 1 tablet (40 mg total) by mouth daily. 90 tablet 3  . Red Yeast Rice Extract (RED YEAST RICE PO) Take by mouth. 2 caps po at bedtime.    . SUMAtriptan (IMITREX) 50 MG tablet Take 1 tablet (50 mg total) by mouth as needed for migraine. May repeat in 2 hours if headache persists or recurs. 27 tablet 2  . topiramate (TOPAMAX) 25 MG tablet Take 1 tablet (25 mg total) by mouth 2 (two) times daily. 25 mg at bedtime for 1 week then increase to 1 twice daily 60 tablet 3  . Vitamin D, Ergocalciferol, (DRISDOL) 50000 units CAPS capsule   2   No current facility-administered medications for this visit.      Allergies:  Allergies  Allergen Reactions  .  Aspirin Nausea Only    Past Medical History, Surgical history, Social history, and Family History were reviewed and updated.   Physical Exam: Blood pressure (!) 142/80, pulse 69, temperature 98.5 F (36.9 C), temperature source Oral, resp. rate 18, weight 166 lb 6.4 oz (75.5 kg), SpO2 100 %. ECOG: 0 General appearance: Well-appearing woman without distress. Head: Normocephalic, without obvious abnormality no oral thrush noted. Neck: no adenopathy Lymph nodes: Cervical, supraclavicular, and axillary nodes  normal. Heart:regular rate and rhythm, S1, S2 normal, no murmur, click, rub or gallop Lung:chest clear, no wheezing, rales, normal symmetric air entry Abdomin: soft, non-tender, without masses or organomegaly no rebound or guarding. EXT:no erythema, induration, or nodules   Lab Results: Lab Results  Component Value Date   WBC 6.9 04/19/2016   HGB 15.1 04/19/2016   HCT 45.2 04/19/2016   MCV 91.2 04/19/2016   PLT 216 04/19/2016     Chemistry      Component Value Date/Time   NA 138 04/22/2015 0809   K 4.5 04/22/2015 0809   CL 100 04/16/2014 1037   CO2 28 04/22/2015 0809   BUN 10.8 04/22/2015 0809   CREATININE 0.8 04/22/2015 0809      Component Value Date/Time   CALCIUM 10.7 (H) 04/22/2015 0809   ALKPHOS 100 04/22/2015 0809   AST 17 04/22/2015 0809   ALT 17 04/22/2015 0809   BILITOT 0.42 04/22/2015 0809       Impression and Plan:   60 year old with the following issues:  1. Iron deficiency anemia documented by a hemoglobin of 11.3 and ferritin of 4.7. He is status post IV iron replacement with her hemoglobin normalizing after the completion of infusion in February 2016.  Her hemoglobin and iron studies continues to be normal in without oral iron replacement. It appears that her iron stores are fully replete at this time and no iron supplement is needed.    2. Facial numbness and gait disturbance: Her neurological workup is unrevealing. Her MRI was reviewed and showed no acute abnormalities. She follows up with neurology regarding this issue.  3. Pulmonary nodule: She had a CT scan of the chest on 02/16/2015 showed stable nodules that are not concerning for malignancy.   4. Age-appropriate cancer screening: She is up-to-date on colon cancer screening as well as mammography.  5. Follow-up will be as needed in the future.   [facility], MD 9/12/201710:22 AM

## 2016-06-08 ENCOUNTER — Telehealth: Payer: Self-pay | Admitting: Internal Medicine

## 2016-06-08 MED ORDER — PANTOPRAZOLE SODIUM 40 MG PO TBEC: 40.0000 mg | DELAYED_RELEASE_TABLET | Freq: Every day | ORAL | 3 refills | Status: DC

## 2016-06-08 NOTE — Telephone Encounter (Signed)
Pt is having problems with reflux, she ran out of her protonix. Refill sent in and pt scheduled to see Amy Esterwood PA 06/27/16@2 :30pm. Pt aware of appt.

## 2016-06-08 NOTE — Telephone Encounter (Signed)
Left message for pt to call back  °

## 2016-06-20 ENCOUNTER — Telehealth: Payer: Self-pay | Admitting: Internal Medicine

## 2016-06-20 ENCOUNTER — Ambulatory Visit (INDEPENDENT_AMBULATORY_CARE_PROVIDER_SITE_OTHER): Payer: BLUE CROSS/BLUE SHIELD | Admitting: Sports Medicine

## 2016-06-20 ENCOUNTER — Encounter: Payer: Self-pay | Admitting: Sports Medicine

## 2016-06-20 DIAGNOSIS — M545 Low back pain: Secondary | ICD-10-CM

## 2016-06-20 MED ORDER — PANTOPRAZOLE SODIUM 40 MG PO TBEC: 40.0000 mg | DELAYED_RELEASE_TABLET | Freq: Two times a day (BID) | ORAL | 0 refills | Status: DC

## 2016-06-20 MED ORDER — KETOROLAC TROMETHAMINE 60 MG/2ML IM SOLN
60.0000 mg | Freq: Once | INTRAMUSCULAR | Status: AC
  Administered 2016-06-20: 60 mg via INTRAMUSCULAR

## 2016-06-20 MED ORDER — CYCLOBENZAPRINE HCL 10 MG PO TABS
10.0000 mg | ORAL_TABLET | Freq: Every evening | ORAL | 0 refills | Status: DC | PRN
Start: 2016-06-20 — End: 2017-08-10

## 2016-06-20 MED ORDER — METHYLPREDNISOLONE ACETATE 80 MG/ML IJ SUSP
80.0000 mg | Freq: Once | INTRAMUSCULAR | Status: AC
  Administered 2016-06-20: 80 mg via INTRAMUSCULAR

## 2016-06-20 NOTE — Telephone Encounter (Signed)
Ok with increased to BID-AC until further discussed with Nicoletta Ba, [facility] as scheduled

## 2016-06-20 NOTE — Progress Notes (Signed)
   Subjective:    Patient ID: [name], female    DOB: Jan 19, 1956, 60 y.o.   MRN: [id number]  HPI   Chief complaint: Right hip pain  Patient comes in today complaining of 3 weeks of right hip pain. She has a history of bilateral greater trochanteric bursitis but her current pain is a little different in nature than what she has experienced with that in the past. She describes an achy discomfort that is along the lateral right hip. It will radiate into the anterior thigh and, at times, into the medial aspect of her right lower leg and foot. She is also experiencing some localized numbness in the right side of her lumbar spine. Her symptoms were made worse after a long car ride to [facility]. She has been taking meloxicam for the past 3 weeks and as a result, her pain has improved but not resolved. It has been severe enough that she's had to take oxycodone as well. She's noticed some mild weakness in the right leg. She denies any groin pain. 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 Last MRI of her lumbar spine was done in 2012.  Interim medical history is reviewed Medications are reviewed Allergies are reviewed    Review of Systems As above    Objective:   Physical Exam  Well-developed, well-nourished. No acute distress. Awake alert and oriented 3. Vital signs reviewed  Right hip: Smooth painless hip range of motion with a negative logroll. There is no tenderness to palpation directly over the right greater trochanteric bursa.  Lumbar spine: Good lumbar range of motion. She has pain with forward flexion which is alleviated with extension.  Neurological exam: Reflexes are equal at the Achilles and patellar tendons bilaterally. No atrophy. Patient has a little bit of weakness with resisted plantar flexion on the right compared to the left. Otherwise, strength is 5/5. Sensation is intact to light-touch distally. She has a positive straight leg raise  on the right.      Assessment & Plan:   Right hip pain secondary to lumbar radiculopathy  Patient's history and physical exam findings suggest lumbar radiculopathy as the cause of her pain more so than greater trochanteric bursitis. We will inject her with 80 mg of Depo-Medrol IM and 60 mg of Toradol IM. She does not do well with oral prednisone. She will continue with her meloxicam for another 4-5 days. I've given her a refill on Flexeril which has helped her in the past. She also needs a new pair of green sports insoles for cushioning. She is educated on a couple of McKenzie extension exercises to start doing daily at home. She'll use moist heat as well.  She will avoid repetitive lumbar flexion and prolonged sitting as much as possible. If symptoms persist, will need to consider updated imaging of her lumbar spine. She will follow-up for ongoing or recalcitrant issues.

## 2016-06-20 NOTE — Telephone Encounter (Signed)
Patient states that she wants pantoprazole twice daily. States she was on twice daily ppi at one time. Your last note states she should take pantoprazole 40 mg daily. She is scheduled to see Amy on 06/27/16. Are you okay with me increasing to pantoprazole twice daily?

## 2016-06-20 NOTE — Telephone Encounter (Signed)
I have advised patient that per Dr Hilarie Fredrickson, she may have a prescription for twice daily pantoprazole until her appointment with Amy on 06/27/16 at which time further recommendations can be given. Patient verbalizes understanding. Rx sent to [facility].

## 2016-06-27 ENCOUNTER — Encounter: Payer: Self-pay | Admitting: Nurse Practitioner

## 2016-06-27 ENCOUNTER — Ambulatory Visit (INDEPENDENT_AMBULATORY_CARE_PROVIDER_SITE_OTHER): Payer: BLUE CROSS/BLUE SHIELD | Admitting: Physician Assistant

## 2016-06-27 ENCOUNTER — Encounter: Payer: Self-pay | Admitting: Physician Assistant

## 2016-06-27 ENCOUNTER — Ambulatory Visit (INDEPENDENT_AMBULATORY_CARE_PROVIDER_SITE_OTHER): Payer: BLUE CROSS/BLUE SHIELD | Admitting: Nurse Practitioner

## 2016-06-27 VITALS — BP 113/70 | HR 71 | Ht 66.0 in | Wt 168.2 lb

## 2016-06-27 VITALS — BP 120/70 | HR 82 | Ht 66.0 in | Wt 167.0 lb

## 2016-06-27 DIAGNOSIS — K2100 Gastro-esophageal reflux disease with esophagitis, without bleeding: Secondary | ICD-10-CM

## 2016-06-27 DIAGNOSIS — G43909 Migraine, unspecified, not intractable, without status migrainosus: Secondary | ICD-10-CM

## 2016-06-27 MED ORDER — SUMATRIPTAN SUCCINATE 100 MG PO TABS: 100.0000 mg | ORAL_TABLET | ORAL | 1 refills | Status: DC | PRN

## 2016-06-27 MED ORDER — DICYCLOMINE HCL 10 MG PO CAPS: ORAL_CAPSULE | ORAL | 3 refills | Status: DC

## 2016-06-27 MED ORDER — PANTOPRAZOLE SODIUM 40 MG PO TBEC: 40.0000 mg | DELAYED_RELEASE_TABLET | Freq: Two times a day (BID) | ORAL | 3 refills | Status: DC

## 2016-06-27 MED ORDER — TOPIRAMATE 25 MG PO TABS: 25.0000 mg | ORAL_TABLET | Freq: Every day | ORAL | 0 refills | Status: DC

## 2016-06-27 NOTE — Progress Notes (Signed)
[facility]  PATIENT: JAMANI ELEY DOB: [birth year]   REASON FOR VISIT: Follow-up for migraine HISTORY FROM: Patient    HISTORY OF PRESENT ILLNESS: HISTORY: ELLAR HAKALA is a 60 yo RH female, referred by her PCP Dr. Sherrlyn Hock for evaluation of unsteady gait, left facial numbness,  She has past medical history of migraine, depression anxiety. In November 25 2014 she woke up from overnight sleep, feeling well, but when she tried to get up, she felt unsteady gait, veered to her left side, lasting for 1 hour, she also noticed severe left-sided pounding headaches, with left facial numbness, She was evaluated by her primary care the same day, CAT scan of the brain was normal, I have personally reviewed the film Laboratory evaluation showed normal CBC, CMP, elevated LDL 183, cholesterol 278 She also had normal ultrasound of carotid artery, She reported long-standing history of migraine, much improved since she one time protein free diet since 2009.  UPDATE June 15th 2016:YYShe had nerve block for prologned severe headache in April 28th 2016 for prolonged headaches, which has been very helpful. She had 2 bad headaches since last visit in April 28th, lasting for 2-3 days, could not functions, tramadol helps her some, but only temporarily, also caused constipation I have personally reviewed MRI of the brain in Jan 03 2015 that was normal.. She still complains of daily headache, bilateral frontal area pressure headaches, few times each week, exacerbated into a more severe headaches, with large tinnitus, light noise smell sensitivity, lasting hours to days, Trigger for her headaches are stress, weather change, certain food, such as onion UPDATE August 23 2016YY:She continue has mild daily pressure headaches, couple times a week it would be exacerbated to moderate sometimes severe headaches with associated light noise sensitivity, Maxalt used to be helpful, no significant side effect,  but in the past 2 weeks, Maxalt has become less effective She also complains of depression anxiety, she is under a lot of stress, nortriptyline 20 mg has caused her mood swing, has stopping March 18 2015, she has mild improvement in her mood UPDATE 09/28/2015 CMMs. Pall, 60 year old female returns for follow-up she has a history of migraines and was last seen in the office by Dr. Krista Blue 03/31/2015. She has failed nortriptyline in the past and was placed on propranolol at her last visit however she is not longer taking the medication. Imitrex works acutely. She has about 1 headache per week or less presently. She feels her headaches are in better control. She is not aware of any migraine triggers. She returns for reevaluation UPDATE 08/21/2017CM [name], 60 year old female returns for follow-up. She has history of migraine headaches which have worsened since May. She was having about 1 headache a week when last seen in February. She claims she is having about 4-5 headaches per week. She has failed nortriptyline and Inderal in the past. She currently takes Imitrex that works acutely. She returns for reevaluation. She is not aware of any specific migraine triggers UPDATE 06/27/2016 CMMs. [name], 60 year old female returns for follow-up. She has a history of migraine headaches and is currently having one to 2 week. When last seen she was having 4-5 headaches per week. She was placed on Topamax low-dose and thinks she may have had rash on the medication she stopped it. She claims she told her primary care about this and he told her that he was not aware that Topamax caused skin problems and to restart the medication as ordered, however she has been  hesitant to do so. Imitrex works acutely. She returns for reevaluation REVIEW OF SYSTEMS: Full 14 system review of systems performed and notable only for those listed, all others are neg:  Constitutional: neg  Cardiovascular: neg Ear/Nose/Throat: Ringing in the  ears Skin: neg Eyes: Light sensitivity Respiratory: neg Gastroitestinal: neg  Hematology/Lymphatic: neg  Endocrine: Intolerance to cold Musculoskeletal: Joint pain Allergy/Immunology: Environmental allergies ,food allergies Neurological: Headache Psychiatric: Depression and anxiety Sleep : Snoring   ALLERGIES: Allergies  Allergen Reactions  . Aspirin Nausea Only  . Topamax [Topiramate] Rash    And hives     HOME MEDICATIONS: Outpatient Medications Prior to Visit  Medication Sig Dispense Refill  . clonazePAM (KLONOPIN) 0.5 MG tablet Take 1 tablet (0.5 mg total) by mouth 2 (two) times daily as needed. 180 tablet 2  . cyclobenzaprine (FLEXERIL) 10 MG tablet Take 1 tablet (10 mg total) by mouth at bedtime as needed for muscle spasms. 30 tablet 0  . escitalopram (LEXAPRO) 20 MG tablet Take 20 mg by mouth daily.      . meloxicam (MOBIC) 15 MG tablet Take 1 tablet (15 mg total) by mouth daily as needed for pain (take with food.). 60 tablet 1  . ondansetron (ZOFRAN ODT) 4 MG disintegrating tablet Take 1 tablet (4 mg total) by mouth every 8 (eight) hours as needed for nausea or vomiting. 30 tablet 5  . pantoprazole (PROTONIX) 40 MG tablet Take 1 tablet (40 mg total) by mouth daily. 90 tablet 3  . pantoprazole (PROTONIX) 40 MG tablet Take 1 tablet (40 mg total) by mouth 2 (two) times daily before a meal. 60 tablet 0  . Red Yeast Rice Extract (RED YEAST RICE PO) Take by mouth. 2 caps po at bedtime.    . SUMAtriptan (IMITREX) 50 MG tablet Take 1 tablet (50 mg total) by mouth as needed for migraine. May repeat in 2 hours if headache persists or recurs. 27 tablet 2  . Vitamin D, Ergocalciferol, (DRISDOL) 50000 units CAPS capsule   2  . topiramate (TOPAMAX) 25 MG tablet Take 1 tablet (25 mg total) by mouth 2 (two) times daily. 25 mg at bedtime for 1 week then increase to 1 twice daily (Patient not taking: Reported on 06/27/2016) 60 tablet 3   No facility-administered medications prior to  visit.     PAST MEDICAL HISTORY: Past Medical History:  Diagnosis Date  . Adenomatous colon polyp   . Allergy    SEASONAL  . Anemia   . Anxiety   . Arthritis    BILATERAL HANDS,TOES ON RT. FOOT  . Bladder disease   . Chronic interstitial cystitis   . Depressive disorder, not elsewhere classified   . Esophageal reflux   . Fundic gland polyps of stomach, benign   . GERD (gastroesophageal reflux disease)   . Hemorrhoids   . Hepatic cyst   . Hyperlipidemia   . Irritable bowel syndrome   . Migraine   . Numbness and tingling   . Pulmonary nodules   . Rosacea   . Syncope   . Unspecified constipation   . Unsteady gait    Lips and chin    PAST SURGICAL HISTORY: Past Surgical History:  Procedure Laterality Date  . COLONOSCOPY  10/08/2010   Normal--multiple   . CYSTECTOMY    . DILATION AND CURETTAGE OF UTERUS    . ESOPHAGEAL MANOMETRY N/A 10/22/2012   Procedure: ESOPHAGEAL MANOMETRY (EM);  Surgeon: Sable Feil, MD;  Location: [facility];  Service: Endoscopy;  Laterality: N/A;  . ESOPHAGOGASTRODUODENOSCOPY  10/08/2010  . LAPAROSCOPY    . milk gland removal    . OOPHORECTOMY    . VAGINAL HYSTERECTOMY      FAMILY HISTORY: Family History  Problem Relation Age of Onset  . Heart attack Mother   . Bladder Cancer Mother   . Heart disease Mother   . Bladder Cancer Father   . Pancreatic cancer Paternal Grandfather   . Diabetes Paternal Grandmother   . Stroke Paternal Grandmother   . Colon cancer Neg Hx     SOCIAL HISTORY: Social History   Social History  . Marital status: Married    Spouse name: N/A  . Number of children: 3  . Years of education: 14   Occupational History  . clerk of social services [employer]  . Smoking status: Former Smoker    Packs/day: 0.50    Years: 16.00    Types: Cigarettes    Quit date: 08/09/1987  . Smokeless tobacco: Never Used  . Alcohol use 0.0 oz/week     Comment: Occasionally  .  Drug use: No  . Sexual activity: Not on file   Other Topics Concern  . Not on file   Social History Narrative   Lives at home with husband.   Right-handed.    1 cup caffeine per day.     PHYSICAL EXAM  Vitals:   06/27/16 0906  BP: 113/70  Pulse: 71  Weight: 168 lb 3.2 oz (76.3 kg)  Height: 5\' 6"  (1.676 m)   Body mass index is 27.15 kg/m. Gen: NAD, conversant, well nourised, obese, well groomed  Cardiovascular: Regular rate rhythm,  Neck: Supple, no carotid bruit.  NEUROLOGICAL EXAM:  MENTAL STATUS: Speech:Speech is normal; fluent and spontaneous with normal comprehension.  Cognition:The patient is oriented to person, place, and time;  recent and remote memory intact;  language fluent;  normal attention, concentration,  fund of knowledge.  CRANIAL NERVES: CN II: Visual fields are full to confrontation.  Pupils were equal round reactive to light CN III, IV, VI: extraocular movement are normal. No ptosis. CN V: Facial sensation is intact to pinprick in all 3 divisions bilaterally.   CN VII: Face is symmetric with normal eye closure and smile. CN VIII: Hearing is normal to rubbing fingers CN IX, X: Palate elevates symmetrically. Phonation is normal. CN XI: Head turning and shoulder shrug are intact CN XII: Tongue is midline with normal movements and no atrophy.  MOTOR:There is no pronator drift of out-stretched arms. Muscle bulk and tone are normal. Muscle strength is normal. REFLEXES:Reflexes are 2+ and symmetric at the biceps, triceps, knees, and ankles. Plantar responses are flexor. SENSORY:Light touch, pinprick, position sense, and vibration sense are intact in fingers and toes. COORDINATION:Rapid alternating movements and fine finger movements are intact. There is no dysmetria on finger-to-nose and heel-knee-shin. There are no abnormal or extraneous movements.  GAIT/STANCE:Posture is normal. Gait is steady with normal steps, base, arm  swing, and turning. Heel and toe walking are normal. Tandem gait is normal.  Romberg is negative  DIAGNOSTIC DATA (LABS, IMAGING, TESTING) - I reviewed patient records, labs, notes, testing and imaging myself where available.  Lab Results  Component Value Date   WBC 6.9 04/19/2016   HGB 15.1 04/19/2016   HCT 45.2 04/19/2016   MCV 91.2 04/19/2016   PLT 216 04/19/2016       ASSESSMENT AND PLAN  60 y.o. year old female  has a past medical history of  chronic migraine, depression and anxiety and some daytime sleepiness. She has tried nortriptyline which caused mood swings. Propanolol was not helpful . Presently  having 2 -3 headaches per  week . The patient is a current patient of Dr. Krista Blue  who is out of the office today . This note is sent to the work in doctor.     PLAN: Topamax 25 mg at night for two  weeks then increase to 2  tablets daily stop if skin rash occurs Reviewed  most common side effects which include weight loss, kidney stone, eye pain and paresthesias. Increase water  intake with this medication Continue Imitrex will refill will increase to 100 mg dose Keep a record of your headaches and bring to the next visit Discussed migraine triggers Follow-up in 4 months Dennie Bible, [facility], [facility], [facility] [address] [place], [place] [address] [phone]

## 2016-06-27 NOTE — Patient Instructions (Signed)
We sent a prescription to [facility] for the Pantoprazole sodium 40 mg.    We sent a prescription for Bentyl ( dicyclomine) 10 mg to [facility].   We have provided you with Reflux handouts.

## 2016-06-27 NOTE — Patient Instructions (Signed)
Increase Imitrex to 100 mg acute headache Restart Topamax 25 mg for 2 weeks then 2 at bedtime Follow-up in 4 months

## 2016-06-27 NOTE — Progress Notes (Addendum)
Subjective:    Patient ID: [name], female    DOB: April 12, 1956, 60 y.o.   MRN: [id number]  HPI [name] is a 60 year old white female known to Dr. Hilarie Fredrickson with history of chronic GERD, IBS, interstitial cystitis and history of iron deficiency anemia. She has also had adenomatous colon polyps. She was last seen in our office about a year ago. She had undergone colonoscopy and EGD in March 2016. EGD was normal including negative small bowel biopsies colonoscopy pertinent only for a sessile serrated adenoma 8 mm reviewed removed from the transverse colon. She also had capsule endoscopy in March 2016 which was normal. She was referred to hematology and has had iron replacement IV. She says she has been released by Dr. Alen Blew.. Patient is maintained on chronic Protonix for GERD. She says she ran out of medication in September which flared up her symptoms. She says she woke up one night with a bad episode of reflux with bit of burning fluid up in her mouth and nose. After that her heartburn indigestion symptoms flared for quite a while. She was able to get the Protonix refilled but had to increase her dose to twice a day. She had started having more daytime symptoms as well and was having frequent nighttime reflux. As no complaints of dysphagia or odynophagia. She says she's been eating very bland and has to be very careful about eating acidic foods like tomato sauce etc. She uses Levsin on a when necessary basis for IBS symptoms and for chest discomfort and has a prescription for Bentyl which she uses very infrequently for IBS.  Review of Systems Pertinent positive and negative review of systems were noted in the above HPI section.  All other review of systems was otherwise negative.  Outpatient Encounter Prescriptions as of 06/27/2016  Medication Sig  . clonazePAM (KLONOPIN) 0.5 MG tablet Take 1 tablet (0.5 mg total) by mouth 2 (two) times daily as needed.  . cyclobenzaprine (FLEXERIL) 10 MG tablet  Take 1 tablet (10 mg total) by mouth at bedtime as needed for muscle spasms.  Marland Kitchen escitalopram (LEXAPRO) 20 MG tablet Take 20 mg by mouth daily.    . ondansetron (ZOFRAN ODT) 4 MG disintegrating tablet Take 1 tablet (4 mg total) by mouth every 8 (eight) hours as needed for nausea or vomiting.  . pantoprazole (PROTONIX) 40 MG tablet Take 1 tablet (40 mg total) by mouth 2 (two) times daily before a meal.  . Red Yeast Rice Extract (RED YEAST RICE PO) Take by mouth. 2 caps po at bedtime.  . SUMAtriptan (IMITREX) 100 MG tablet Take 1 tablet (100 mg total) by mouth every 2 (two) hours as needed for migraine. May repeat in 2 hours if headache persists or recurs.  . topiramate (TOPAMAX) 25 MG tablet Take 1 tablet (25 mg total) by mouth daily. 1 po daily for 1 week then 2 at night (Patient taking differently: Take 25 mg by mouth daily. 1 po daily for 2 weeks  then 2 at night)  . Vitamin D, Ergocalciferol, (DRISDOL) 50000 units CAPS capsule   . [DISCONTINUED] pantoprazole (PROTONIX) 40 MG tablet Take 1 tablet (40 mg total) by mouth 2 (two) times daily before a meal.  . dicyclomine (BENTYL) 10 MG capsule Take 1 tab every 6 hours as needed for cramping, abdominal spasms.  . [DISCONTINUED] meloxicam (MOBIC) 15 MG tablet Take 1 tablet (15 mg total) by mouth daily as needed for pain (take with food.).  . [DISCONTINUED] pantoprazole (PROTONIX)  40 MG tablet Take 1 tablet (40 mg total) by mouth daily.  . [DISCONTINUED] SUMAtriptan (IMITREX) 100 MG tablet Take 100 mg by mouth every 2 (two) hours as needed for migraine. May repeat in 2 hours if headache persists or recurs.  . [DISCONTINUED] topiramate (TOPAMAX) 25 MG tablet Take 25 mg by mouth daily. 1 po daily for 1 week then 2 at night   No facility-administered encounter medications on file as of 06/27/2016.    Allergies  Allergen Reactions  . Aspirin Nausea Only  . Prednisone Other (See Comments)    Extreme fatigue  . Topamax [Topiramate] Rash    And hives     Patient Active Problem List   Diagnosis Date Noted  . 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 Facet arthropathy 02/22/2016  . Migraine 09/28/2015  . Clubbed fingers 03/24/2015  . Multiple pulmonary nodules 03/23/2015  . Iron deficiency anemia 09/24/2014  . Non-celiac gluten sensitivity 04/16/2014  . Abdominal pain, unspecified site 02/12/2014  . IBS (irritable bowel syndrome) 09/05/2013  . Hair loss 04/03/2013  . Anxiety and depression 09/27/2011  . Syncope 03/09/2011  . ANEMIA, IRON DEFICIENCY 09/14/2010  . CELIAC SPRUE 11/06/2009  . OTHER CONSTIPATION 10/06/2009  . ABDOMINAL PAIN, LEFT LOWER QUADRANT 08/18/2008  . Depression 10/22/2007  . GERD 10/22/2007  . CONSTIPATION 10/22/2007  . IBS 10/22/2007  . HEPATIC CYST 10/22/2007  . INTERSTITIAL CYSTITIS 10/22/2007   Social History   Social History  . Marital status: Married    Spouse name: N/A  . Number of children: 3  . Years of education: 14   Occupational History  . 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 Smoking status: Former Smoker    Packs/day: 0.50    Years: 16.00    Types: Cigarettes    Quit date: 08/09/1987  . Smokeless tobacco: Never Used  . Alcohol use 0.0 oz/week     Comment: Occasionally  . Drug use: No  . Sexual activity: Not on file   Other Topics Concern  . Not on file   Social History Narrative   Lives at home with husband.   Right-handed.    1 cup caffeine per day.    Ms. Sauer family history includes Bladder Cancer in her father and mother; Diabetes in her paternal grandmother; Heart attack in her mother; Heart disease in her mother; Pancreatic cancer in her paternal grandfather; Stroke in her paternal grandmother.      Objective:    Vitals:   06/27/16 1418  BP: 120/70  Pulse: 82    Physical Exam well-developed older white female in no acute distress blood pressure 120/70 pulse 82, BMI 26.9. HEENT; nontraumatic normocephalic EOMI PERRLA  sclera anicteric, Cardiovascular; regular rate and rhythm with S1-S2 no murmur or gallop, Pulmonary; clear bilaterally, Abdomen; soft, nontender nondistended bowel sounds are active no palpable mass or hepatosplenomegaly, Rectal ;exam not done, Neuropsych ;mood and affect appropriate       Assessment & Plan:   #26 60 year old white female chronic GERD with prolonged exacerbation off PPI briefly. I suspect she developed reflux induced esophagitis. Symptoms are now controlled on twice a day PPI. #2 IBS # 3 history of sessile serrated adenomatous polyp-will be due for follow-up colonoscopy March 2021 #4 history of iron deficiency anemia-negative GI evaluation [name] has done IV iron replacement per hematology  Plan; discussed strict anti-reflux regimen, nothing by mouth for 3 hours prior to bed and elevation of the head of the bed  at least 45 to get better control over her nocturnal symptoms For now she will continue Protonix 40 mg by mouth twice a day, through the holidays then asked her to wean back to 40 mg every morning with Zantac at bedtime for a month or so and if tolerating well then go back to Protonix 40 mg every morning. She will follow up with Dr. Hilarie Fredrickson or myself as needed.  Amy S Esterwood PA-C 06/27/2016   Cc: [name], MD   Addendum: Reviewed and agree with management. Jerene Bears, MD

## 2016-06-28 ENCOUNTER — Ambulatory Visit: Payer: BLUE CROSS/BLUE SHIELD | Admitting: Nurse Practitioner

## 2016-10-25 ENCOUNTER — Ambulatory Visit (INDEPENDENT_AMBULATORY_CARE_PROVIDER_SITE_OTHER): Payer: BLUE CROSS/BLUE SHIELD | Admitting: Nurse Practitioner

## 2016-10-25 ENCOUNTER — Encounter: Payer: Self-pay | Admitting: Nurse Practitioner

## 2016-10-25 VITALS — BP 113/77 | HR 65 | Ht 66.0 in | Wt 160.4 lb

## 2016-10-25 DIAGNOSIS — G43909 Migraine, unspecified, not intractable, without status migrainosus: Secondary | ICD-10-CM

## 2016-10-25 NOTE — Progress Notes (Signed)
[facility]  PATIENT: [name] DOB: [birth year]   REASON FOR VISIT: Follow-up for migraine HISTORY FROM: Patient    HISTORY OF PRESENT ILLNESS: HISTORY: [name] is a 61 yo RH female, referred by her PCP Dr. Sherrlyn Hock for evaluation of unsteady gait, left facial numbness,  She has past medical history of migraine, depression anxiety. In November 25 2014 she woke up from overnight sleep, feeling well, but when she tried to get up, she felt unsteady gait, veered to her left side, lasting for 1 hour, she also noticed severe left-sided pounding headaches, with left facial numbness, She was evaluated by her primary care the same day, CAT scan of the brain was normal, I have personally reviewed the film Laboratory evaluation showed normal CBC, CMP, elevated LDL 183, cholesterol 278 She also had normal ultrasound of carotid artery, She reported long-standing history of migraine, much improved since she one time protein free diet since 2009.  UPDATE June 15th 2016:[name] had nerve block for prologned severe headache in April 28th 2016 for prolonged headaches, which has been very helpful. She had 2 bad headaches since last visit in April 28th, lasting for 2-3 days, could not functions, tramadol helps her some, but only temporarily, also caused constipation I have personally reviewed MRI of the brain in Jan 03 2015 that was normal.. She still complains of daily headache, bilateral frontal area pressure headaches, few times each week, exacerbated into a more severe headaches, with large tinnitus, light noise smell sensitivity, lasting hours to days, Trigger for her headaches are stress, weather change, certain food, such as onion UPDATE August 23 2016YY:She continue has mild daily pressure headaches, couple times a week it would be exacerbated to moderate sometimes severe headaches with associated light noise sensitivity, Maxalt used to be helpful, no significant side effect,  but in the past 2 weeks, Maxalt has become less effective She also complains of depression anxiety, she is under a lot of stress, nortriptyline 20 mg has caused her mood swing, has stopping March 18 2015, she has mild improvement in her mood UPDATE 09/28/2015 CMMs. Pall, 61 year old female returns for follow-up she has a history of migraines and was last seen in the office by Dr. Krista Blue 03/31/2015. She has failed nortriptyline in the past and was placed on propranolol at her last visit however she is not longer taking the medication. Imitrex works acutely. She has about 1 headache per week or less presently. She feels her headaches are in better control. She is not aware of any migraine triggers. She returns for reevaluation UPDATE 08/21/2017CM [name], 61 year old female returns for follow-up. She has history of migraine headaches which have worsened since May. She was having about 1 headache a week when last seen in February. She claims she is having about 4-5 headaches per week. She has failed nortriptyline and Inderal in the past. She currently takes Imitrex that works acutely. She returns for reevaluation. She is not aware of any specific migraine triggers UPDATE 06/27/2016 CMMs. [name], 61 year old female returns for follow-up. She has a history of migraine headaches and is currently having one to 2 week. When last seen she was having 4-5 headaches per week. She was placed on Topamax low-dose and thinks she may have had rash on the medication she stopped it. She claims she told her primary care about this and he told her that he was not aware that Topamax caused skin problems and to restart the medication as ordered, however she has been  hesitant to do so. Imitrex works acutely. She returns for reevaluation UPDATE 03/20/2018CM [name], 61 year old female returns for follow-up with history of migraine headaches. She was placed back on Topamax when last seen however she felt the Topamax made her migraines  worse and she took the medication for about 1 month then stopped it. She is aware of her migraine triggers, stress is a big trigger and dairy products are a trigger. Imitrex works acutely. She has failed nortriptyline in the past. She had no headaches for the much month of January and  February. One bad headache so far for the month of March. She continues to work full-time and says her job is very stressful. She does little exercise. She returns for reevaluation REVIEW OF SYSTEMS: Full 14 system review of systems performed and notable only for those listed, all others are neg:  Constitutional: neg  Cardiovascular: neg Ear/Nose/Throat: Ringing in the ears Skin: neg Eyes: Light sensitivity Respiratory: neg Gastroitestinal: Constipation Hematology/Lymphatic: neg  Endocrine: Intolerance to cold Musculoskeletal: Joint pain, back pain Allergy/Immunology: Environmental allergies ,food allergies Neurological: Headache Psychiatric: Depression and anxiety Sleep : Snoring   ALLERGIES: Allergies  Allergen Reactions  . Aspirin Nausea Only  . Prednisone Other (See Comments)    Extreme fatigue  . Topamax [Topiramate] Rash    And hives     HOME MEDICATIONS: Outpatient Medications Prior to Visit  Medication Sig Dispense Refill  . clonazePAM (KLONOPIN) 0.5 MG tablet Take 1 tablet (0.5 mg total) by mouth 2 (two) times daily as needed. 180 tablet 2  . cyclobenzaprine (FLEXERIL) 10 MG tablet Take 1 tablet (10 mg total) by mouth at bedtime as needed for muscle spasms. 30 tablet 0  . dicyclomine (BENTYL) 10 MG capsule Take 1 tab every 6 hours as needed for cramping, abdominal spasms. 40 capsule 3  . escitalopram (LEXAPRO) 20 MG tablet Take 20 mg by mouth daily.      . ondansetron (ZOFRAN ODT) 4 MG disintegrating tablet Take 1 tablet (4 mg total) by mouth every 8 (eight) hours as needed for nausea or vomiting. 30 tablet 5  . pantoprazole (PROTONIX) 40 MG tablet Take 1 tablet (40 mg total) by mouth 2  (two) times daily before a meal. 180 tablet 3  . SUMAtriptan (IMITREX) 100 MG tablet Take 1 tablet (100 mg total) by mouth every 2 (two) hours as needed for migraine. May repeat in 2 hours if headache persists or recurs. 30 tablet 1  . Vitamin D, Ergocalciferol, (DRISDOL) 50000 units CAPS capsule Take 50,000 Units by mouth every 7 (seven) days.   2  . Red Yeast Rice Extract (RED YEAST RICE PO) Take by mouth. 2 caps po at bedtime.    . topiramate (TOPAMAX) 25 MG tablet Take 1 tablet (25 mg total) by mouth daily. 1 po daily for 1 week then 2 at night (Patient not taking: Reported on 10/25/2016) 180 tablet 0   No facility-administered medications prior to visit.     PAST MEDICAL HISTORY: Past Medical History:  Diagnosis Date  . Adenomatous colon polyp   . Allergy    SEASONAL  . Anemia   . Anxiety   . Arthritis    BILATERAL HANDS,TOES ON RT. FOOT  . Bladder disease   . Chronic interstitial cystitis   . Depressive disorder, not elsewhere classified   . Esophageal reflux   . Fundic gland polyps of stomach, benign   . GERD (gastroesophageal reflux disease)   . Hemorrhoids   . Hepatic cyst   .  Hyperlipidemia   . Irritable bowel syndrome   . Migraine   . Numbness and tingling   . Pulmonary nodules   . Rosacea   . Syncope   . Unspecified constipation   . Unsteady gait    Lips and chin    PAST SURGICAL HISTORY: Past Surgical History:  Procedure Laterality Date  . COLONOSCOPY  10/08/2010   Normal--multiple   . CYSTECTOMY    . DILATION AND CURETTAGE OF UTERUS    . ESOPHAGEAL MANOMETRY N/A 10/22/2012   Procedure: ESOPHAGEAL MANOMETRY (EM);  Surgeon: Sable Feil, MD;  Location: [facility];  Service: Endoscopy;  Laterality: N/A;  . ESOPHAGOGASTRODUODENOSCOPY  10/08/2010  . LAPAROSCOPY    . milk gland removal    . OOPHORECTOMY    . VAGINAL HYSTERECTOMY      FAMILY HISTORY: Family History  Problem Relation Age of Onset  . Heart attack Mother   . Bladder Cancer Mother   .  Heart disease Mother   . Bladder Cancer Father   . Pancreatic cancer Paternal Grandfather   . Diabetes Paternal Grandmother   . Stroke Paternal Grandmother   . Colon cancer Neg Hx     SOCIAL HISTORY: Social History   Social History  . Marital status: Married    Spouse name: N/A  . Number of children: 3  . Years of education: 14   Occupational History  . 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 Smoking status: Former Smoker    Packs/day: 0.50    Years: 16.00    Types: Cigarettes    Quit date: 08/09/1987  . Smokeless tobacco: Never Used  . Alcohol use 0.0 oz/week     Comment: Occasionally  . Drug use: No  . Sexual activity: Not on file   Other Topics Concern  . Not on file   Social History Narrative   Lives at home with husband.   Right-handed.    1 cup caffeine per day.     PHYSICAL EXAM  Vitals:   10/25/16 1459  BP: 113/77  Pulse: 65  Weight: 160 lb 6.4 oz (72.8 kg)  Height: 5\' 6"  (1.676 m)   Body mass index is 25.89 kg/m.  Physical Exam General: well developed, well nourished, seated, in no evident distress Head: head normocephalic and atraumatic. Oropharynx benign Neck: supple with no carotid  bruits Cardiovascular: regular rate and rhythm, no murmurs  Neurologic Exam Mental Status: Awake and fully alert. Oriented to place and time. Follows all commands. Speech and language normal.   Cranial Nerves:  Pupils equal, briskly reactive to light. Extraocular movements full without nystagmus. Visual fields full to confrontation. Hearing intact and symmetric to finger snap. Facial sensation intact. Face, tongue, palate move normally and symmetrically. Neck flexion and extension normal.  Motor: Normal bulk and tone. Normal strength in all tested extremity muscles.No focal weakness Sensory.: intact to touch and pinprick and vibratory in the upper and lower extremities.  Coordination: Rapid alternating movements normal in  all extremities. Finger-to-nose and heel-to-shin performed accurately bilaterally. Gait and Station: Arises from chair without difficulty. Stance is normal. Gait demonstrates normal stride length and balance . Able to heel, toe and tandem walk without difficulty.  Reflexes: 2+ and symmetric. Toes downgoing.    DIAGNOSTIC DATA (LABS, IMAGING, TESTING) - I reviewed patient records, labs, notes, testing and imaging myself where available.  Lab Results  Component Value Date   WBC 6.9 04/19/2016   HGB 15.1  04/19/2016   HCT 45.2 04/19/2016   MCV 91.2 04/19/2016   PLT 216 04/19/2016   ASSESSMENT AND PLAN  61 y.o. year old female  has a past medical history of  chronic migraine, depression and anxiety and some daytime sleepiness. She has tried nortriptyline which caused mood swings. Propanolol was not helpful . Topamax caused increased headaches. Presently  having 1 to 2  headaches per  month.Marland Kitchen     PLAN: Continue Imitrex  100 mg dose does not need refills Keep a record of your headaches if they worsen  Discussed migraine triggers avoid known triggers Follow-up in 6 months I spent 15 min in total face to face time with the patient more than 50% of which was spent counseling and coordination of care, reviewing test results reviewing medications and discussing and reviewing the diagnosis of migraine  and further treatment options.If headaches increase again will try another preventive but for now things are stable. Dennie Bible, [facility], [facility], APRN  [facility] [address] [place], [place] [address] [phone]

## 2016-10-25 NOTE — Patient Instructions (Signed)
Continue Imitrex  100 mg dose Keep a record of your headaches if they worsen Avoid migraine triggers Follow-up in 6 months

## 2017-04-30 NOTE — Progress Notes (Signed)
[facility]  PATIENT: [name] DOB: [birth year]   REASON FOR VISIT: Follow-up for migraine HISTORY FROM: Patient    HISTORY OF PRESENT ILLNESS: HISTORY: [name] is a 61 yo RH female, referred by her PCP Dr. Sherrlyn Hock for evaluation of unsteady gait, left facial numbness,  She has past medical history of migraine, depression anxiety. In November 25 2014 she woke up from overnight sleep, feeling well, but when she tried to get up, she felt unsteady gait, veered to her left side, lasting for 1 hour, she also noticed severe left-sided pounding headaches, with left facial numbness, She was evaluated by her primary care the same day, CAT scan of the brain was normal, I have personally reviewed the film Laboratory evaluation showed normal CBC, CMP, elevated LDL 183, cholesterol 278 She also had normal ultrasound of carotid artery, She reported long-standing history of migraine, much improved since she one time protein free diet since 2009.  UPDATE June 15th 2016:YYShe had nerve block for prologned severe headache in April 28th 2016 for prolonged headaches, which has been very helpful. She had 2 bad headaches since last visit in April 28th, lasting for 2-3 days, could not functions, tramadol helps her some, but only temporarily, also caused constipation I have personally reviewed MRI of the brain in Jan 03 2015 that was normal.. She still complains of daily headache, bilateral frontal area pressure headaches, few times each week, exacerbated into a more severe headaches, with large tinnitus, light noise smell sensitivity, lasting hours to days, Trigger for her headaches are stress, weather change, certain food, such as onion UPDATE August 23 2016YY:She continue has mild daily pressure headaches, couple times a week it would be exacerbated to moderate sometimes severe headaches with associated light noise sensitivity, Maxalt used to be helpful, no significant side effect,  but in the past 2 weeks, Maxalt has become less effective She also complains of depression anxiety, she is under a lot of stress, nortriptyline 20 mg has caused her mood swing, has stopping March 18 2015, she has mild improvement in her mood UPDATE 09/28/2015 CMMs. Pall, 61 year old female returns for follow-up she has a history of migraines and was last seen in the office by Dr. Krista Stephens 03/31/2015. She has failed nortriptyline in the past and was placed on propranolol at her last visit however she is not longer taking the medication. Imitrex works acutely. She has about 1 headache per week or less presently. She feels her headaches are in better control. She is not aware of any migraine triggers. She returns for reevaluation UPDATE 08/21/2017CM [name], 61 year old female returns for follow-up. She has history of migraine headaches which have worsened since May. She was having about 1 headache a week when last seen in February. She claims she is having about 4-5 headaches per week. She has failed nortriptyline and Inderal in the past. She currently takes Imitrex that works acutely. She returns for reevaluation. She is not aware of any specific migraine triggers UPDATE 06/27/2016 CMMs. [name], 61 year old female returns for follow-up. She has a history of migraine headaches and is currently having one to 2 week. When last seen she was having 4-5 headaches per week. She was placed on Topamax low-dose and thinks she may have had rash on the medication she stopped it. She claims she told her primary care about this and he told her that he was not aware that Topamax caused skin problems and to restart the medication as ordered, however she has been  hesitant to do so. Imitrex works acutely. She returns for reevaluation UPDATE 03/20/2018CM [name], 61 year old female returns for follow-up with history of migraine headaches. She was placed back on Topamax when last seen however she felt the Topamax made her migraines  worse and she took the medication for about 1 month then stopped it. She is aware of her migraine triggers, stress is a big trigger and dairy products are a trigger. Imitrex works acutely. She has failed nortriptyline in the past. She had no headaches for the much month of January and  February. One bad headache so far for the month of March. She continues to work full-time and says her job is very stressful. She does little exercise. She returns for reevaluation  UPDATE 09/24/2018CM [name], 61 year old female returns for follow-up with history  of migraine headaches. When last seen she had stopped her nortriptyline but was taking Imitrex acutely however her migraines worsened and she started back on her nortriptyline taking 2 pills at night and she has only had one headache in 2 weeks. That headache was relieved with Imitrex. She avoids migraine triggers. She continues to work full-time and says her job is very stressful she gets little exercise. She has failed Topamax in the past. She returns for reevaluation REVIEW OF SYSTEMS: Full 14 system review of systems performed and notable only for those listed, all others are neg:  Constitutional: neg  Cardiovascular: neg Ear/Nose/Throat: Ringing in the ears Skin: neg Eyes: Light sensitivity Respiratory: neg Gastroitestinal: Constipation Hematology/Lymphatic: neg  Endocrine: Intolerance to cold Musculoskeletal: Joint pain, back pain Allergy/Immunology: Environmental allergies ,food allergies Neurological: Headache Psychiatric: Depression and anxiety Sleep : Snoring   ALLERGIES: Allergies  Allergen Reactions  . Aspirin Nausea Only  . Prednisone Other (See Comments)    Extreme fatigue  . Topamax [Topiramate] Rash    And hives     HOME MEDICATIONS: Outpatient Medications Prior to Visit  Medication Sig Dispense Refill  . clonazePAM (KLONOPIN) 0.5 MG tablet Take 1 tablet (0.5 mg total) by mouth 2 (two) times daily as needed. 180 tablet 2    . cyclobenzaprine (FLEXERIL) 10 MG tablet Take 1 tablet (10 mg total) by mouth at bedtime as needed for muscle spasms. 30 tablet 0  . dicyclomine (BENTYL) 10 MG capsule Take 1 tab every 6 hours as needed for cramping, abdominal spasms. 40 capsule 3  . escitalopram (LEXAPRO) 20 MG tablet Take 20 mg by mouth daily.      . ondansetron (ZOFRAN ODT) 4 MG disintegrating tablet Take 1 tablet (4 mg total) by mouth every 8 (eight) hours as needed for nausea or vomiting. 30 tablet 5  . pantoprazole (PROTONIX) 40 MG tablet Take 1 tablet (40 mg total) by mouth 2 (two) times daily before a meal. 180 tablet 3  . SUMAtriptan (IMITREX) 100 MG tablet Take 1 tablet (100 mg total) by mouth every 2 (two) hours as needed for migraine. May repeat in 2 hours if headache persists or recurs. 30 tablet 1  . Vitamin D, Ergocalciferol, (DRISDOL) 50000 units CAPS capsule Take 50,000 Units by mouth every 7 (seven) days.   2   No facility-administered medications prior to visit.     PAST MEDICAL HISTORY: Past Medical History:  Diagnosis Date  . Adenomatous colon polyp   . Allergy    SEASONAL  . Anemia   . Anxiety   . Arthritis    BILATERAL HANDS,TOES ON RT. FOOT  . Bladder disease   . Chronic interstitial cystitis   .  Depressive disorder, not elsewhere classified   . Esophageal reflux   . Fundic gland polyps of stomach, benign   . GERD (gastroesophageal reflux disease)   . Hemorrhoids   . Hepatic cyst   . Hyperlipidemia   . Irritable bowel syndrome   . Migraine   . Numbness and tingling   . Pulmonary nodules   . Rosacea   . Syncope   . Unspecified constipation   . Unsteady gait    Lips and chin    PAST SURGICAL HISTORY: Past Surgical History:  Procedure Laterality Date  . COLONOSCOPY  10/08/2010   Normal--multiple   . CYSTECTOMY    . DILATION AND CURETTAGE OF UTERUS    . ESOPHAGEAL MANOMETRY N/A 10/22/2012   Procedure: ESOPHAGEAL MANOMETRY (EM);  Surgeon: Sable Feil, MD;  Location: [facility];  Service: Endoscopy;  Laterality: N/A;  . ESOPHAGOGASTRODUODENOSCOPY  10/08/2010  . LAPAROSCOPY    . milk gland removal    . OOPHORECTOMY    . VAGINAL HYSTERECTOMY      FAMILY HISTORY: Family History  Problem Relation Age of Onset  . Heart attack Mother   . Bladder Cancer Mother   . Heart disease Mother   . Bladder Cancer Father   . Pancreatic cancer Paternal Grandfather   . Diabetes Paternal Grandmother   . Stroke Paternal Grandmother   . Colon cancer Neg Hx     SOCIAL HISTORY: Social History   Social History  . Marital status: Married    Spouse name: N/A  . Number of children: 3  . Years of education: 14   Occupational History  . 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 Smoking status: Former Smoker    Packs/day: 0.50    Years: 16.00    Types: Cigarettes    Quit date: 08/09/1987  . Smokeless tobacco: Never Used  . Alcohol use 0.0 oz/week     Comment: Occasionally  . Drug use: No  . Sexual activity: Not on file   Other Topics Concern  . Not on file   Social History Narrative   Lives at home with husband.   Right-handed.    1 cup caffeine per day.  Marland Kitchen   PHYSICAL EXAM  Vitals:   05/01/17 0840  BP: 136/81  Pulse: 63  Weight: 165 lb 3.2 oz (74.9 kg)   Body mass index is 26.66 kg/m.  Physical Exam General: well developed, well nourished, seated, in no evident distress Head: head normocephalic and atraumatic. Oropharynx benign Neck: supple  Cardiovascular: regular rate and rhythm, no murmurs  Neurologic Exam Mental Status: Awake and fully alert. Oriented to place and time. Follows all commands. Speech and language normal.   Cranial Nerves:  Pupils equal, briskly reactive to light. Extraocular movements full without nystagmus. Visual fields full to confrontation. Hearing intact and symmetric to finger snap. Facial sensation intact. Face, tongue, palate move normally and symmetrically. Neck flexion and  extension normal.  Motor: Normal bulk and tone. Normal strength in all tested extremity muscles.No focal weakness Sensory.: intact to touch in the upper and lower extremities.  Coordination: Rapid alternating movements normal in all extremities. Finger-to-nose and heel-to-shin performed accurately bilaterally. Gait and Station: Arises from chair without difficulty. Stance is normal. Gait demonstrates normal stride length and balance . Able to heel, toe and tandem walk without difficulty.  Reflexes: 2+ and symmetric. Toes downgoing.    DIAGNOSTIC DATA (LABS, IMAGING, TESTING) - I reviewed patient records,  labs, notes, testing and imaging myself where available.  Lab Results  Component Value Date   WBC 6.9 04/19/2016   HGB 15.1 04/19/2016   HCT 45.2 04/19/2016   MCV 91.2 04/19/2016   PLT 216 04/19/2016   ASSESSMENT AND PLAN  61 y.o. year old female  has a past medical history of  chronic migraine, depression and anxiety and some daytime sleepiness. She has tried nortriptyline which caused mood swings. Propanolol was not helpful . Topamax caused increased headaches. Her headaches recently worsened and she resumed her nortriptyline 10 mg at night for one week and now on 20 mg at night. She has had one headache in the last 2 weeks which was relieved with Imitrex      PLAN: Continue Imitrex  100 mg dose will refill Continue nortriptyline 20 mg at bedtime will refill Keep a record of your headaches if they worsen , migraine tracker APP Avoid  known triggers for migraine Follow-up in 8 months I spent 20 min in total face to face time with the patient more than 50% of which was spent counseling and coordination of care, reviewing test results reviewing medications and discussing and reviewing the diagnosis of migraine  and further treatment options.If headaches increase again will increase nortriptyline  but for now things are stable. Dennie Bible, [facility], [name], APRN  [facility] [address], [place], [place] [address] [phone]     ,

## 2017-05-01 ENCOUNTER — Encounter: Payer: Self-pay | Admitting: Nurse Practitioner

## 2017-05-01 ENCOUNTER — Ambulatory Visit (INDEPENDENT_AMBULATORY_CARE_PROVIDER_SITE_OTHER): Payer: BLUE CROSS/BLUE SHIELD | Admitting: Nurse Practitioner

## 2017-05-01 VITALS — BP 136/81 | HR 63 | Wt 165.2 lb

## 2017-05-01 DIAGNOSIS — G43909 Migraine, unspecified, not intractable, without status migrainosus: Secondary | ICD-10-CM

## 2017-05-01 MED ORDER — SUMATRIPTAN SUCCINATE 100 MG PO TABS: 100.0000 mg | ORAL_TABLET | ORAL | 2 refills | Status: DC | PRN

## 2017-05-01 MED ORDER — NORTRIPTYLINE HCL 10 MG PO CAPS: 20.0000 mg | ORAL_CAPSULE | Freq: Every day | ORAL | 2 refills | Status: DC

## 2017-05-01 NOTE — Progress Notes (Signed)
I have reviewed and agreed above plan. 

## 2017-05-01 NOTE — Patient Instructions (Signed)
Continue Imitrex  100 mg dose will refill Continue nortriptyline 20 mg at bedtime will refill Keep a record of your headaches if they worsen  Avoid  known triggers for migraine Follow-up in 8 months

## 2017-07-06 ENCOUNTER — Telehealth: Payer: Self-pay | Admitting: Cardiovascular Disease

## 2017-07-06 NOTE — Telephone Encounter (Signed)
Patient made aware and verbalized understanding.

## 2017-07-06 NOTE — Telephone Encounter (Signed)
Patient calling, states that she is having heart flutters and some arm pain. Patient is scheduled for Monday @11am  with Vin Bhagat.   If you cant reach patient on mobile # please try home # [phone]

## 2017-07-06 NOTE — Telephone Encounter (Signed)
Returned call to patient who states that she has had intermittent episodes of her heart racing, left arm pain, HA, and nausea. She states that she gets migraines on a regular basis. She states that yesterday at work she started to get a HA and she took Tylenol. She states that when she got home she took her Imitrex. She states that her she had a severe HA and she was nauseous. She states that she developed left arm pain that radiated into her neck and jaw. She states that she got really nervous and her heart started racing. She is unsure of how high it got. Patient states that she took a Baby ASA and took a bath and after her bath her pain was relieved. She states that she has been having these episodes intermittently for the past month. She states that yesterday was the worst. Patient states that she does not check her BP/HR at home but her most recent systolic BP at an appointment last week was 135-140s per patient. There is not recent testing on file as this patient has not been seen by cardiology since 2012 (Dr. Acie Fredrickson). She states that the episodes always begin with her getting a migraine. Patient sees a neurologist for her migraines. Per Neuro recs for her migraines patient is suppose to be taking nortriptyline 20 mg QD (patient had stopped taking this and just restarted taking it this week) as well as Imitrex 100 mg q2h prn (patient states that she has only had to take this a few times a week). Patient states that she has had a stressful year at work and states that she does get herself worked up at times. She states that she does take Klonopin 0.5 mg BID prn. Patient denies having any symptoms at this time. Patient states that she had an appointment to see Dr. Acie Fredrickson in January. Patient states that when she called she requested to be seen earlier. Patient has an appointment already scheduled with Robbie Lis, PA on Monday. Instructed patient to avoid her triggers for migraines, take her meds as directed, and  to reach out to her neurologist for further recommendation regarding the management of her migraines. Instructed the patient to let us know if her cardiac symptoms worsen prior to her appointment. Patient verbalized understanding and thanked me for the call.

## 2017-07-06 NOTE — Telephone Encounter (Signed)
Agree with note by Drue Novel.

## 2017-07-10 ENCOUNTER — Ambulatory Visit (INDEPENDENT_AMBULATORY_CARE_PROVIDER_SITE_OTHER): Payer: BLUE CROSS/BLUE SHIELD | Admitting: Cardiovascular Disease

## 2017-07-10 ENCOUNTER — Ambulatory Visit: Payer: BLUE CROSS/BLUE SHIELD | Admitting: Physician Assistant

## 2017-07-10 ENCOUNTER — Encounter: Payer: Self-pay | Admitting: Cardiovascular Disease

## 2017-07-10 ENCOUNTER — Telehealth (HOSPITAL_COMMUNITY): Payer: Self-pay | Admitting: *Deleted

## 2017-07-10 ENCOUNTER — Telehealth: Payer: Self-pay | Admitting: Physician Assistant

## 2017-07-10 VITALS — BP 132/84 | HR 64 | Ht 66.0 in | Wt 166.8 lb

## 2017-07-10 DIAGNOSIS — R0789 Other chest pain: Secondary | ICD-10-CM | POA: Diagnosis not present

## 2017-07-10 DIAGNOSIS — Z8249 Family history of ischemic heart disease and other diseases of the circulatory system: Secondary | ICD-10-CM | POA: Diagnosis not present

## 2017-07-10 DIAGNOSIS — E782 Mixed hyperlipidemia: Secondary | ICD-10-CM | POA: Diagnosis not present

## 2017-07-10 DIAGNOSIS — R002 Palpitations: Secondary | ICD-10-CM | POA: Diagnosis not present

## 2017-07-10 NOTE — Progress Notes (Signed)
Cardiology Office Note:    Date:  07/10/2017   ID:  [name], DOB [birth year], MRN [id number]  PCP:  [name], MD  Cardiologist:  Mertie Moores, MD    Referring MD: [name], MD   Problem List 1.  Hyperlipidemia 2.  Left arm pain  3.  Palpitations   Chief Complaint  Patient presents with  . Palpitations    History of Present Illness:    [name] is a 61 y.o. female with a hx of  Palpitations.  I saw [name] many years ago for palpitations  She is seen back today for eval of some left arm pain .   Left arm pain radiated up to her neck and left jaw.   She took an ASA and the pain resolved after an hour.  Described as a left arm burning. Started when she was in bed.     Stinging pain in her left side of neck and jaw.  Associated with heart fluttering.  Was slightly short of breath and was lightheaded.   Does not get much exercise.   Sits at a desk at work .    Has hx of hyperlipidemia  ( last LDL was 188) .   Tried crestor - made her feel like she had the flu.  She has not tried any other statins since that time. Mother had a MI in her 31s.   Past Medical History:  Diagnosis Date  . Adenomatous colon polyp   . Allergy    SEASONAL  . Anemia   . Anxiety   . Arthritis    BILATERAL HANDS,TOES ON RT. FOOT  . Bladder disease   . Chronic interstitial cystitis   . Depressive disorder, not elsewhere classified   . Esophageal reflux   . Fundic gland polyps of stomach, benign   . GERD (gastroesophageal reflux disease)   . Hemorrhoids   . Hepatic cyst   . Hyperlipidemia   . Irritable bowel syndrome   . Migraine   . Numbness and tingling   . Pulmonary nodules   . Rosacea   . Syncope   . Unspecified constipation   . Unsteady gait    Lips and chin    Past Surgical History:  Procedure Laterality Date  . COLONOSCOPY  10/08/2010   Normal--multiple   . CYSTECTOMY    . DILATION AND CURETTAGE OF UTERUS    . ESOPHAGEAL MANOMETRY N/A 10/22/2012   Procedure: ESOPHAGEAL MANOMETRY (EM);  Surgeon: Sable Feil, MD;  Location: [facility];  Service: Endoscopy;  Laterality: N/A;  . ESOPHAGOGASTRODUODENOSCOPY  10/08/2010  . LAPAROSCOPY    . milk gland removal    . OOPHORECTOMY    . VAGINAL HYSTERECTOMY      Current Medications: Current Meds  Medication Sig  . clonazePAM (KLONOPIN) 0.5 MG tablet Take 1 tablet (0.5 mg total) by mouth 2 (two) times daily as needed.  . cyclobenzaprine (FLEXERIL) 10 MG tablet Take 1 tablet (10 mg total) by mouth at bedtime as needed for muscle spasms.  Marland Kitchen dicyclomine (BENTYL) 10 MG capsule Take 1 tab every 6 hours as needed for cramping, abdominal spasms.  Marland Kitchen escitalopram (LEXAPRO) 20 MG tablet Take 20 mg by mouth daily.    . nortriptyline (PAMELOR) 10 MG capsule Take 2 capsules (20 mg total) by mouth at bedtime.  . ondansetron (ZOFRAN ODT) 4 MG disintegrating tablet Take 1 tablet (4 mg total) by mouth every 8 (eight) hours as needed for nausea or  vomiting.  . pantoprazole (PROTONIX) 40 MG tablet Take 1 tablet (40 mg total) by mouth 2 (two) times daily before a meal.  . SUMAtriptan (IMITREX) 100 MG tablet Take 1 tablet (100 mg total) by mouth every 2 (two) hours as needed for migraine. May repeat in 2 hours if headache persists or recurs.  . Vitamin D, Ergocalciferol, (DRISDOL) 50000 units CAPS capsule Take 50,000 Units by mouth every 7 (seven) days.      Allergies:   Aspirin; Prednisone; and Topamax [topiramate]   Social History   Socioeconomic History  . Marital status: Married    Spouse name: None  . Number of children: 3  . Years of education: 38  . Highest education level: None  Social Needs  . Financial resource strain: None  . Food insecurity - worry: None  . Food insecurity - inability: None  . Transportation needs - medical: None  . Transportation needs - non-medical: None  Occupational History  . 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 Smoking status: Former Smoker    Packs/day: 0.50    Years: 16.00    Pack years: 8.00    Types: Cigarettes    Last attempt to quit: 08/09/1987    Years since quitting: 29.9  . Smokeless tobacco: Never Used  Substance and Sexual Activity  . Alcohol use: Yes    Alcohol/week: 0.0 oz    Comment: Occasionally  . Drug use: No  . Sexual activity: None  Other Topics Concern  . None  Social History Narrative   Lives at home with husband.   Right-handed.    1 cup caffeine per day.     Family History: The patient's family history includes Bladder Cancer in her father and mother; Diabetes in her paternal grandmother; Heart attack in her mother; Heart disease in her mother; Pancreatic cancer in her paternal grandfather; Stroke in her paternal grandmother. There is no history of Colon cancer. ROS:   Please see the history of present illness.     All other systems reviewed and are negative.  EKGs/Labs/Other Studies Reviewed:    The following studies were reviewed today:   EKG:  EKG is  ordered today.  The ekg ordered today demonstrates NSR at 64.     Recent Labs: No results found for requested labs within last 8760 hours.  Recent Lipid Panel No results found for: CHOL, TRIG, HDL, CHOLHDL, VLDL, LDLCALC, LDLDIRECT  Physical Exam:    VS:  BP 132/84   Pulse 64   Ht 5\' 6"  (1.676 m)   Wt 166 lb 12.8 oz (75.7 kg)   SpO2 98%   BMI 26.92 kg/m     Wt Readings from Last 3 Encounters:  07/10/17 166 lb 12.8 oz (75.7 kg)  05/01/17 165 lb 3.2 oz (74.9 kg)  10/25/16 160 lb 6.4 oz (72.8 kg)     GEN:  Well nourished, well developed in no acute distress HEENT: Normal NECK: No JVD; No carotid bruits LYMPHATICS: No lymphadenopathy CARDIAC: RRR, no murmurs, rubs, gallops RESPIRATORY:  Clear to auscultation without rales, wheezing or rhonchi  ABDOMEN: Soft, non-tender, non-distended MUSCULOSKELETAL:  No edema; No deformity  SKIN: Warm and dry NEUROLOGIC:  Alert and oriented x  3 PSYCHIATRIC:  Normal affect   ASSESSMENT:    No diagnosis found. PLAN:    In order of problems listed above:  1. Left arm pain :  Sounds concerning for angina.  The  pain radiated up to her jaw into her neck.  It lasted for several minutes and then resolved. She has a family history of premature coronary artery disease and has a very high cholesterol level.  We will schedule her for an exercise Myoview study.  I advised her to call us or call 911 immediately if she has recurrent pain.  2.  Hyperlipidemia: Her labs were checked and September.  Her last LDL was 188.  She tried Crestor in the past but developed flulike symptoms.  Will refer to the lipid clinic for consideration of PCSK9 inhibitor .   3.  Palpitations:   intermittent .   Has not been as much of a problem recently .  Will address later if she continues to have them .    Medication Adjustments/Labs and Tests Ordered: Current medicines are reviewed at length with the patient today.  Concerns regarding medicines are outlined above.  No orders of the defined types were placed in this encounter.  No orders of the defined types were placed in this encounter.   Signed, Mertie Moores, MD  07/10/2017 11:13 AM    [place]

## 2017-07-10 NOTE — Telephone Encounter (Signed)
Patient states she needs prior auth called into [phone] at [facility]. Pt last ov with APP Amy 11.20.17. Patient also wants to know when to come in for next follow up.

## 2017-07-10 NOTE — Telephone Encounter (Signed)
Left message on voicemail in reference to upcoming appointment scheduled for 07/13/17. Phone number given for a call back so details instructions can be given. [name]

## 2017-07-10 NOTE — Patient Instructions (Addendum)
Medication Instructions:  Your physician recommends that you continue on your current medications as directed. Please refer to the Current Medication list given to you today.   Labwork: None Ordered   Testing/Procedures: Your physician has requested that you have an exercise stress myoview. For further information please visit [email or web address]. Please follow instruction sheet, as given.   Follow-Up: You have been referred to Lipid Clinic for management of your high cholesterol   Your physician recommends that you schedule a follow-up appointment in: 3 months with Dr. Acie Fredrickson   If you need a refill on your cardiac medications before your next appointment, please call your pharmacy.   Thank you for choosing [facility]! Christen Bame, RN [phone]

## 2017-07-10 NOTE — Telephone Encounter (Signed)
Called the patient and advised her I will do the Prior authorization through Cover my meds. Adivsed I will call her with the outcome.

## 2017-07-11 ENCOUNTER — Telehealth (HOSPITAL_COMMUNITY): Payer: Self-pay | Admitting: *Deleted

## 2017-07-11 NOTE — Telephone Encounter (Signed)
Patient given detailed instructions per Myocardial Perfusion Study Information Sheet for the test on 07/13/17 at 1000. Patient notified to arrive 15 minutes early and that it is imperative to arrive on time for appointment to keep from having the test rescheduled.  If you need to cancel or reschedule your appointment, please call the office within 24 hours of your appointment. . Patient verbalized understanding.[name], [name]

## 2017-07-11 NOTE — Telephone Encounter (Signed)
Patient given detailed instructions per Myocardial Perfusion Study Information Sheet for the test on 12/6 at 1000. Patient notified to arrive 15 minutes early and that it is imperative to arrive on time for appointment to keep from having the test rescheduled.  If you need to cancel or reschedule your appointment, please call the office within 24 hours of your appointment. . Patient verbalized understanding. AY

## 2017-07-12 ENCOUNTER — Telehealth: Payer: Self-pay | Admitting: Internal Medicine

## 2017-07-12 ENCOUNTER — Other Ambulatory Visit: Payer: Self-pay | Admitting: Physician Assistant

## 2017-07-12 MED ORDER — PANTOPRAZOLE SODIUM 40 MG PO TBEC: 40.0000 mg | DELAYED_RELEASE_TABLET | Freq: Two times a day (BID) | ORAL | 1 refills | Status: DC

## 2017-07-12 NOTE — Telephone Encounter (Signed)
Rx sent. Left voicemail advising patient of this.

## 2017-07-13 ENCOUNTER — Ambulatory Visit (HOSPITAL_COMMUNITY): Payer: BLUE CROSS/BLUE SHIELD | Attending: Cardiovascular Disease

## 2017-07-13 DIAGNOSIS — R0789 Other chest pain: Secondary | ICD-10-CM | POA: Diagnosis not present

## 2017-07-13 DIAGNOSIS — R002 Palpitations: Secondary | ICD-10-CM | POA: Insufficient documentation

## 2017-07-13 DIAGNOSIS — Z8249 Family history of ischemic heart disease and other diseases of the circulatory system: Secondary | ICD-10-CM | POA: Diagnosis not present

## 2017-07-13 DIAGNOSIS — E782 Mixed hyperlipidemia: Secondary | ICD-10-CM | POA: Diagnosis not present

## 2017-07-13 LAB — MYOCARDIAL PERFUSION IMAGING
Estimated workload: 11.6 METS
Exercise duration (min): 10 min
Exercise duration (sec): 0 s
LV dias vol: 65 mL (ref 46–106)
LV sys vol: 22 mL
MPHR: 157 {beats}/min
Peak HR: 141 {beats}/min
Percent HR: 89 %
RATE: 0.26
Rest HR: 73 {beats}/min
SDS: 0
SRS: 1
SSS: 1
TID: 1.04

## 2017-07-13 MED ORDER — TECHNETIUM TC 99M TETROFOSMIN IV KIT
10.6000 | PACK | Freq: Once | INTRAVENOUS | Status: AC | PRN
  Administered 2017-07-13: 10.6 via INTRAVENOUS
  Filled 2017-07-13: qty 11

## 2017-07-13 MED ORDER — TECHNETIUM TC 99M TETROFOSMIN IV KIT
32.9000 | PACK | Freq: Once | INTRAVENOUS | Status: AC | PRN
  Administered 2017-07-13: 32.9 via INTRAVENOUS
  Filled 2017-07-13: qty 33

## 2017-08-03 ENCOUNTER — Ambulatory Visit (INDEPENDENT_AMBULATORY_CARE_PROVIDER_SITE_OTHER): Payer: BLUE CROSS/BLUE SHIELD | Admitting: Pharmacist

## 2017-08-03 DIAGNOSIS — E785 Hyperlipidemia, unspecified: Secondary | ICD-10-CM | POA: Diagnosis not present

## 2017-08-03 MED ORDER — ATORVASTATIN CALCIUM 10 MG PO TABS: 10.0000 mg | ORAL_TABLET | Freq: Every day | ORAL | 3 refills | Status: DC

## 2017-08-03 NOTE — Progress Notes (Signed)
Patient ID: [name]                 DOB: 01/15/1956                    MRN: [id number]     HPI: [name] is a 61 y.o. female patient of Dr. Acie Fredrickson that presents today for lipid evaluation.  PMH includes HLD, palpitations. She was recently evaluated for anginal pain with a history of premature CAD in her family.   She states a prescription was sent for Zetia and she never picked it up because the cost was high and she does not want to take another statin medication. She is very hesitant to take another statin after taking Crestor.   Risk Factors: angina, family history LDL Goal: <70  Current Medications: none Intolerances: crestor 10mg  - made her feel like she had the flu. - improved after stopping Crestor  Diet: Eat mostly from home. Struggles with acid reflux. Has been eating noodles and mashed potatoes. She does not eat a lot of meat. She is not great about eating vegetables. She eats mostly bean soups. She does eat a lot salads usually. She drinks mostly coffee and tries to get her water in.   Exercise: She is not doing any exercise. She and her husband plan to [facility] [employer] in the new year.   Family History: Mother had a MI in her 91s. Bladder Cancer in her father and mother; Diabetes in her paternal grandmother; Heart attack in her mother; Heart disease in her mother; Pancreatic cancer in her paternal grandfather; Stroke in her paternal grandmother. There is no history of Colon cancer.  Labs: 04/24/17:  TC 278, HDL 59, TG 188, LDL 188 - no cholesterol medication   Past Medical History:  Diagnosis Date  . Adenomatous colon polyp   . Allergy    SEASONAL  . Anemia   . Anxiety   . Arthritis    BILATERAL HANDS,TOES ON RT. FOOT  . Bladder disease   . Chronic interstitial cystitis   . Depressive disorder, not elsewhere classified   . Esophageal reflux   . Fundic gland polyps of stomach, benign   . GERD (gastroesophageal reflux disease)   . Hemorrhoids   .  Hepatic cyst   . Hyperlipidemia   . Irritable bowel syndrome   . Migraine   . Numbness and tingling   . Pulmonary nodules   . Rosacea   . Syncope   . Unspecified constipation   . Unsteady gait    Lips and chin    Current Outpatient Medications on File Prior to Visit  Medication Sig Dispense Refill  . clonazePAM (KLONOPIN) 0.5 MG tablet Take 1 tablet (0.5 mg total) by mouth 2 (two) times daily as needed. 180 tablet 2  . cyclobenzaprine (FLEXERIL) 10 MG tablet Take 1 tablet (10 mg total) by mouth at bedtime as needed for muscle spasms. 30 tablet 0  . dicyclomine (BENTYL) 10 MG capsule Take 1 tab every 6 hours as needed for cramping, abdominal spasms. 40 capsule 3  . escitalopram (LEXAPRO) 20 MG tablet Take 20 mg by mouth daily.      . nortriptyline (PAMELOR) 10 MG capsule Take 2 capsules (20 mg total) by mouth at bedtime. 180 capsule 2  . ondansetron (ZOFRAN ODT) 4 MG disintegrating tablet Take 1 tablet (4 mg total) by mouth every 8 (eight) hours as needed for nausea or vomiting. 30 tablet 5  .  pantoprazole (PROTONIX) 40 MG tablet Take 1 tablet (40 mg total) by mouth 2 (two) times daily before a meal. 180 tablet 1  . pantoprazole (PROTONIX) 40 MG tablet TAKE 1 TABLET TWICE A DAY BEFORE MEALS 180 tablet 3  . SUMAtriptan (IMITREX) 100 MG tablet Take 1 tablet (100 mg total) by mouth every 2 (two) hours as needed for migraine. May repeat in 2 hours if headache persists or recurs. 30 tablet 2  . Vitamin D, Ergocalciferol, (DRISDOL) 50000 units CAPS capsule Take 50,000 Units by mouth every 7 (seven) days.   2   No current facility-administered medications on file prior to visit.     Allergies  Allergen Reactions  . Prednisone Other (See Comments)    Extreme fatigue  . Aspirin Nausea Only  . Topamax [Topiramate] Rash    And hives     Assessment/Plan: Hyperlipidemia: LDL not at goal. Will need a trial on another statin before insurance will cover PCSK9i. Patient is willing to try  atorvastatin 10mg  daily. If unable to tolerate will pursue PCSK9i therapy. She is due to have labs at her primary care office in about 2.5 months. She will have these results faxed to our office.    Thank you,  [name], [facility]  08/03/2017 4:59 AM

## 2017-08-03 NOTE — Patient Instructions (Signed)
We will start atorvastatin (Lipitor) 10mg  daily   If you develop the flu like symptoms again please call us [phone].   Please send cholesterol panel to [phone] when you have labs done with primary care.    Cholesterol Cholesterol is a fat. Your body needs a small amount of cholesterol. Cholesterol (plaque) may build up in your blood vessels (arteries). That makes you more likely to have a heart attack or stroke. You cannot feel your cholesterol level. Having a blood test is the only way to find out if your level is high. Keep your test results. Work with your doctor to keep your cholesterol at a good level. What do the results mean?  Total cholesterol is how much cholesterol is in your blood.  LDL is bad cholesterol. This is the type that can build up. Try to have low LDL.  HDL is good cholesterol. It cleans your blood vessels and carries LDL away. Try to have high HDL.  Triglycerides are fat that the body can store or burn for energy. What are good levels of cholesterol?  Total cholesterol below 200.  LDL below 100 is good for people who have health risks. LDL below 70 is good for people who have very high risks.  HDL above 40 is good. It is best to have HDL of 60 or higher.  Triglycerides below 150. How can I lower my cholesterol? Diet Follow your diet program as told by your doctor.  Choose fish, white meat chicken, or [place] that is roasted or baked. Try not to eat red meat, fried foods, sausage, or lunch meats.  Eat lots of fresh fruits and vegetables.  Choose whole grains, beans, pasta, potatoes, and cereals.  Choose olive oil, corn oil, or canola oil. Only use small amounts.  Try not to eat butter, mayonnaise, shortening, or palm kernel oils.  Try not to eat foods with trans fats.  Choose low-fat or nonfat dairy foods. ? Drink skim or nonfat milk. ? Eat low-fat or nonfat yogurt and cheeses. ? Try not to drink whole milk or cream. ? Try not to eat ice  cream, egg yolks, or full-fat cheeses.  Healthy desserts include angel food cake, ginger snaps, animal crackers, hard candy, popsicles, and low-fat or nonfat frozen yogurt. Try not to eat pastries, cakes, pies, and cookies.  Exercise Follow your exercise program as told by your doctor.  Be more active. Try gardening, walking, and taking the stairs.  Ask your doctor about ways that you can be more active.  Medicine  Take over-the-counter and prescription medicines only as told by your doctor. This information is not intended to replace advice given to you by your health care provider. Make sure you discuss any questions you have with your health care provider. Document Released: 10/21/2008 Document Revised: 02/24/2016 Document Reviewed: 02/04/2016 Elsevier Interactive Patient Education  [employer].

## 2017-08-06 ENCOUNTER — Encounter: Payer: Self-pay | Admitting: Pharmacist

## 2017-08-06 DIAGNOSIS — E785 Hyperlipidemia, unspecified: Secondary | ICD-10-CM | POA: Insufficient documentation

## 2017-08-10 ENCOUNTER — Other Ambulatory Visit: Payer: Self-pay | Admitting: *Deleted

## 2017-08-10 MED ORDER — CYCLOBENZAPRINE HCL 10 MG PO TABS: 10.0000 mg | ORAL_TABLET | Freq: Every evening | ORAL | 0 refills | Status: DC | PRN

## 2017-08-24 ENCOUNTER — Ambulatory Visit: Payer: BLUE CROSS/BLUE SHIELD | Admitting: Cardiovascular Disease

## 2017-09-07 ENCOUNTER — Telehealth: Payer: Self-pay | Admitting: Pharmacist

## 2017-09-07 NOTE — Telephone Encounter (Signed)
Received fax with Lipid panel results as below:  09/04/17 TC 206 HDL 67 TG 171 LDL 110   Pt LDL did come down from 188 after just one month of atorvastatin 10mg  daily. Will need to discuss tolerability and consider increase to 20mg  daily if able.

## 2017-09-07 NOTE — Telephone Encounter (Signed)
Spoke with patient and she reports that she has been tired and had some muscle pains since starting atorvastatin. She is unsure if this is related to the medication. She does feel like she is tolerating atorvastatin much better than the rosuvastatin. For now she would like to continue on atorvastatin 10mg  for another month to see how she does.   We did discuss increasing atorvastatin or adding Zetia, but patient would like to defer for now. Will plan to call her in 4-6 weeks to follow up on tolerability and discuss dose increase again.

## 2017-09-26 ENCOUNTER — Other Ambulatory Visit: Payer: Self-pay

## 2017-09-26 MED ORDER — CYCLOBENZAPRINE HCL 10 MG PO TABS: 10.0000 mg | ORAL_TABLET | Freq: Every evening | ORAL | 0 refills | Status: DC | PRN

## 2017-10-03 ENCOUNTER — Other Ambulatory Visit: Payer: Self-pay | Admitting: Cardiovascular Disease

## 2017-10-03 MED ORDER — ATORVASTATIN CALCIUM 10 MG PO TABS: 10.0000 mg | ORAL_TABLET | Freq: Every day | ORAL | 2 refills | Status: DC

## 2017-10-03 NOTE — Telephone Encounter (Signed)
Pt's medication was sent to pt's pharmacy as requested. Confirmation received.  °

## 2017-10-03 NOTE — Telephone Encounter (Signed)
New message     *STAT* If patient is at the pharmacy, call can be transferred to refill team.   1. Which medications need to be refilled? (please list name of each medication and dose if known) atorvastatin (LIPITOR) 10 MG tablet  2. Which pharmacy/location (including street and city if local pharmacy) is medication to be sent to? [facility], [place]  3. Do they need a 30 day or 90 day supply? [place]

## 2017-10-05 ENCOUNTER — Telehealth: Payer: Self-pay | Admitting: Pharmacist

## 2017-10-05 DIAGNOSIS — E785 Hyperlipidemia, unspecified: Secondary | ICD-10-CM

## 2017-10-05 NOTE — Telephone Encounter (Signed)
Spoke with patient and she reported that she is tolerating the atorvastatin 10mg  well. She would like to see how her LDL is doing before increasing to 20mg  daily.   Have entered orders for her to have labs drawn on 10/16/17 and will determine need to titrate based on this.

## 2017-10-16 ENCOUNTER — Other Ambulatory Visit: Payer: BLUE CROSS/BLUE SHIELD | Admitting: *Deleted

## 2017-10-16 DIAGNOSIS — E785 Hyperlipidemia, unspecified: Secondary | ICD-10-CM

## 2017-10-16 LAB — HEPATIC FUNCTION PANEL
ALT: 30 IU/L (ref 0–32)
AST: 25 IU/L (ref 0–40)
Albumin: 4.4 g/dL (ref 3.6–4.8)
Alkaline Phosphatase: 92 IU/L (ref 39–117)
BILIRUBIN TOTAL: 0.3 mg/dL (ref 0.0–1.2)
Bilirubin, Direct: 0.11 mg/dL (ref 0.00–0.40)
TOTAL PROTEIN: 6.7 g/dL (ref 6.0–8.5)

## 2017-10-16 LAB — LIPID PANEL
CHOL/HDL RATIO: 3.4 ratio (ref 0.0–4.4)
Cholesterol, Total: 201 mg/dL — ABNORMAL HIGH (ref 100–199)
HDL: 59 mg/dL (ref >39)
LDL Calculated: 116 mg/dL — ABNORMAL HIGH (ref 0–99)
TRIGLYCERIDES: 130 mg/dL (ref 0–149)
VLDL Cholesterol Cal: 26 mg/dL (ref 5–40)

## 2017-10-23 ENCOUNTER — Ambulatory Visit: Payer: BLUE CROSS/BLUE SHIELD | Admitting: Cardiovascular Disease

## 2017-10-25 ENCOUNTER — Other Ambulatory Visit: Payer: Self-pay

## 2017-10-25 MED ORDER — CYCLOBENZAPRINE HCL 10 MG PO TABS: 10.0000 mg | ORAL_TABLET | Freq: Every evening | ORAL | 0 refills | Status: AC | PRN

## 2017-10-30 ENCOUNTER — Telehealth: Payer: Self-pay | Admitting: Cardiovascular Disease

## 2017-10-30 NOTE — Telephone Encounter (Signed)
New Message   Mickel Baas at [facility] is trying to get a copy of pt's labs and wants to know what was done. Please call Fax: [phone]

## 2017-10-30 NOTE — Telephone Encounter (Signed)
Called [name] at [facility] in [place], patient's PCP is Seabron Spates and Dr. Tawny Asal and they are requesting most recent lab results be faxed to them. I verified fax number and advised that I will be sending hepatic function and lipid panel. [name] thanked me for the call.  Copies placed in HIM to be faxed to PCP

## 2017-11-07 ENCOUNTER — Other Ambulatory Visit: Payer: Self-pay

## 2017-11-07 DIAGNOSIS — I739 Peripheral vascular disease, unspecified: Secondary | ICD-10-CM

## 2017-12-05 ENCOUNTER — Encounter: Payer: Self-pay | Admitting: Cardiovascular Disease

## 2017-12-06 ENCOUNTER — Ambulatory Visit (INDEPENDENT_AMBULATORY_CARE_PROVIDER_SITE_OTHER): Payer: BLUE CROSS/BLUE SHIELD | Admitting: Vascular Surgery

## 2017-12-06 ENCOUNTER — Ambulatory Visit (HOSPITAL_COMMUNITY)
Admission: RE | Admit: 2017-12-06 | Discharge: 2017-12-06 | Disposition: A | Discharge status: Home / Self Care | Payer: BLUE CROSS/BLUE SHIELD | Source: Ambulatory Visit | Attending: Vascular Surgery | Admitting: Vascular Surgery

## 2017-12-06 ENCOUNTER — Encounter: Payer: Self-pay | Admitting: Vascular Surgery

## 2017-12-06 ENCOUNTER — Encounter

## 2017-12-06 DIAGNOSIS — I7300 Raynaud's syndrome without gangrene: Secondary | ICD-10-CM | POA: Diagnosis not present

## 2017-12-06 DIAGNOSIS — I872 Venous insufficiency (chronic) (peripheral): Secondary | ICD-10-CM | POA: Diagnosis not present

## 2017-12-06 DIAGNOSIS — I739 Peripheral vascular disease, unspecified: Secondary | ICD-10-CM | POA: Diagnosis not present

## 2017-12-06 NOTE — Progress Notes (Addendum)
Requested by:  [name], [place] [address]  Reason for consultation: black toes    History of Present Illness   [name] is a 62 y.o. (1955/09/20) female who presents with cc: black toes during winter.  This patient has prior history consistent with Buerger's disease with associated hand symptoms while actively smoking.  Patient had numbness and color changes to her distal fingers.  This resolved with smoking cessation.  This patient has been told previously she has Raynaud's disease.  Her toes do not change colors in the classic pattern.  Pt notes cold temperature appears to trigger change in her toe color and numbness toes in both feet.  The patient notes some extreme tenderness to the tips of her left 2nd and 3rd toe tip.  The patient denies any further smoking.  She denies any history of neuropathy or spinal disease.  She has been tried on Norvasc in the past without success.   Past Medical History:  Diagnosis Date  . Adenomatous colon polyp   . Allergy    SEASONAL  . Anemia   . Anxiety   . Arthritis    BILATERAL HANDS,TOES ON RT. FOOT  . Bladder disease   . Chronic interstitial cystitis   . Depressive disorder, not elsewhere classified   . Esophageal reflux   . Fundic gland polyps of stomach, benign   . GERD (gastroesophageal reflux disease)   . Hemorrhoids   . Hepatic cyst   . Hyperlipidemia   . Irritable bowel syndrome   . Migraine   . Numbness and tingling   . Pulmonary nodules   . Rosacea   . Syncope   . Unspecified constipation   . Unsteady gait    Lips and chin    Past Surgical History:  Procedure Laterality Date  . COLONOSCOPY  10/08/2010   Normal--multiple   . CYSTECTOMY    . DILATION AND CURETTAGE OF UTERUS    . ESOPHAGEAL MANOMETRY N/A 10/22/2012   Procedure: ESOPHAGEAL MANOMETRY (EM);  Surgeon: Sable Feil, MD;  Location: [facility];  Service: Endoscopy;  Laterality: N/A;  . ESOPHAGOGASTRODUODENOSCOPY  10/08/2010   . LAPAROSCOPY    . milk gland removal    . OOPHORECTOMY    . VAGINAL HYSTERECTOMY       Social History   Socioeconomic History  . Marital status: Married    Spouse name: Not on file  . Number of children: 3  . Years of education: 28  . Highest education level: Not on file  Occupational History  . 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 Financial resource strain: Not on file  . Food insecurity:    Worry: Not on file    Inability: Not on file  . Transportation needs:    Medical: Not on file    Non-medical: Not on file  Tobacco Use  . Smoking status: Former Smoker    Packs/day: 0.50    Years: 16.00    Pack years: 8.00    Types: Cigarettes    Last attempt to quit: 08/09/1987    Years since quitting: 30.3  . Smokeless tobacco: Never Used  Substance and Sexual Activity  . Alcohol use: Yes    Alcohol/week: 0.0 oz    Comment: Occasionally  . Drug use: No  . Sexual activity: Not on file  Lifestyle  . Physical activity:    Days per week: Not  on file    Minutes per session: Not on file  . Stress: Not on file  Relationships  . Social connections:    Talks on phone: Not on file    Gets together: Not on file    Attends religious service: Not on file    Active member of club or organization: Not on file    Attends meetings of clubs or organizations: Not on file    Relationship status: Not on file  . Intimate partner violence:    Fear of current or ex partner: Not on file    Emotionally abused: Not on file    Physically abused: Not on file    Forced sexual activity: Not on file  Other Topics Concern  . Not on file  Social History Narrative   Lives at home with husband.   Right-handed.    1 cup caffeine per day.    Family History  Problem Relation Age of Onset  . Heart attack Mother   . Bladder Cancer Mother   . Heart disease Mother   . Bladder Cancer Father   . Pancreatic cancer Paternal Grandfather   .  Diabetes Paternal Grandmother   . Stroke Paternal Grandmother   . Colon cancer Neg Hx     Current Outpatient Medications  Medication Sig Dispense Refill  . atorvastatin (LIPITOR) 10 MG tablet Take 1 tablet (10 mg total) by mouth daily. 90 tablet 2  . clonazePAM (KLONOPIN) 0.5 MG tablet Take 1 tablet (0.5 mg total) by mouth 2 (two) times daily as needed. 180 tablet 2  . cyclobenzaprine (FLEXERIL) 10 MG tablet Take 1 tablet (10 mg total) by mouth at bedtime as needed for muscle spasms. 20 tablet 0  . dicyclomine (BENTYL) 10 MG capsule Take 1 tab every 6 hours as needed for cramping, abdominal spasms. 40 capsule 3  . escitalopram (LEXAPRO) 20 MG tablet Take 20 mg by mouth daily.      . nortriptyline (PAMELOR) 10 MG capsule Take 2 capsules (20 mg total) by mouth at bedtime. 180 capsule 2  . ondansetron (ZOFRAN ODT) 4 MG disintegrating tablet Take 1 tablet (4 mg total) by mouth every 8 (eight) hours as needed for nausea or vomiting. 30 tablet 5  . pantoprazole (PROTONIX) 40 MG tablet Take 1 tablet (40 mg total) by mouth 2 (two) times daily before a meal. 180 tablet 1  . SUMAtriptan (IMITREX) 100 MG tablet Take 1 tablet (100 mg total) by mouth every 2 (two) hours as needed for migraine. May repeat in 2 hours if headache persists or recurs. 30 tablet 2  . Vitamin D, Cholecalciferol, 1000 units CAPS Take 5,000 Units by mouth daily.    . Vitamin D, Ergocalciferol, (DRISDOL) 50000 units CAPS capsule Take 50,000 Units by mouth every 7 (seven) days.   2   No current facility-administered medications for this visit.     Allergies  Allergen Reactions  . Prednisone Other (See Comments)    Extreme fatigue  . Aspirin Nausea Only  . Topamax [Topiramate] Rash    And hives     REVIEW OF SYSTEMS (negative unless checked):   Cardiac:  [x]  Chest pain or chest pressure? []  Shortness of breath upon activity? []  Shortness of breath when lying flat? []  Irregular heart rhythm?  Vascular:  []  Pain in  calf, thigh, or hip brought on by walking? []  Pain in feet at night that wakes you up from your sleep? []  Blood clot in your veins? []  Leg swelling?  Pulmonary:  []  Oxygen at home? []  Productive cough? []  Wheezing?  Neurologic:  [x]  Sudden weakness in arms or legs? []  Sudden numbness in arms or legs? []  Sudden onset of difficult speaking or slurred speech? []  Temporary loss of vision in one eye? []  Problems with dizziness?  Gastrointestinal:  []  Blood in stool? []  Vomited blood?  Genitourinary:  []  Burning when urinating? []  Blood in urine?  Psychiatric:  []  Major depression  Hematologic:  []  Bleeding problems? []  Problems with blood clotting?  Dermatologic:  []  Rashes or ulcers?  Constitutional:  []  Fever or chills?  Ear/Nose/Throat:  []  Change in hearing? []  Nose bleeds? []  Sore throat?  Musculoskeletal:  []  Back pain? []  Joint pain? []  Muscle pain?   Physical Examination     Vitals:   12/06/17 1138  BP: 137/84  Pulse: 67  Resp: 18  Temp: 97.6 F (36.4 C)  TempSrc: Oral  SpO2: 99%  Weight: 174 lb (78.9 kg)  Height: 5\' 6"  (1.676 m)   Body mass index is 28.08 kg/m.  General Alert, O x 3, WD, NAD  [place]/AT,    Ear/Nose/ Throat Hearing grossly intact, nares without erythema or drainage, oropharynx without Erythema or Exudate, Mallampati score: 3,   Eyes PERRLA, EOMI,    Neck Supple, mid-line trachea,    Pulmonary Sym exp, good B air movt, CTA B  Cardiac RRR, Nl S1, S2, no Murmurs, No rubs, No S3,S4  Vascular Vessel Right Left  Radial Palpable Palpable  Brachial Palpable Palpable  Carotid Palpable, No Bruit Palpable, No Bruit  Aorta Not palpable N/A  Femoral Palpable Palpable  Popliteal Not palpable Not palpable  PT Palpable Palpable  DP Palpable Palpable    Gastro- intestinal soft, non-distended, non-tender to palpation, No guarding or rebound, no HSM, no masses, no CVAT B, No palpable prominent aortic pulse,    Musculo- skeletal  M/S 5/5 throughout  , Extremities without ischemic changes  , No edema present, Varicosities present: L > R (small to moderate), No Lipodermatosclerosis present, L 2nd and 3rd toe cyanosis  Neurologic Cranial nerves 2-12 intact , Pain and light touch intact in extremities , Motor exam as listed above  Psychiatric Judgement intact, Mood & affect appropriate for pt's clinical situation  Dermatologic See M/S exam for extremity exam, No rashes otherwise noted  Lymphatic  Palpable lymph nodes: None    Non-invasive Vascular Imaging     ABI (12/06/2017)  R:   ABI: 1.24,   PT: tri  DP: tri  TBI:  0.83  Normal waveforms in toes except 3rd and 4th toes  L:   ABI: 1.29,   PT: tri  DP: bi  TBI: 0.86  Normal waveforms in toes   Outside Studies/Documentation   10 pages of outside documents were reviewed including: outpatient PCP records.   Medical Decision Making   SIBONEY REQUEJO is a 62 y.o. female who presents with: history of Buerger's disease, no evidence of PAD, possible Raynaud's syndrome   Today's screening test demonstrate no arterial insufficiency.  Waveform in all digits appear normal with dampening in the R 3rd and 4th toe.  The L 2nd and 3rd toe are the cyanotic ones on exam today.  This suggestive the color change in her feet are likely multifactorial as the temperature outside is current 85 degree F.  Evaluation of valve function in both legs might be beneficial.  Based on the patient's vascular studies and examination, I have offered the patient: BLE venous reflux  duplex.  I suspect draining screening labs for secondary Raynaud's is likely to be low yield as the patient is currently asx.  Labwork +/- angiography while actively sx would be of greater yield.  I discussed in depth with the patient the nature of atherosclerosis, and emphasized the importance of maximal medical management including strict control of blood pressure, blood glucose, and lipid levels,  obtaining regular exercise, antiplatelet agents, and cessation of smoking.    The patient is currently on a statin: Lipitor.   The patient is currently not on ASA due to intolerance,  The patient is aware that without maximal medical management the underlying atherosclerotic disease process will progress, limiting the benefit of any interventions.  Thank you for allowing [place] to participate in this patient's care.   Adele Barthel, MD, FACS [facility] Office: [phone] Pager: [phone]  12/06/2017, 12:14 PM

## 2017-12-07 ENCOUNTER — Other Ambulatory Visit: Payer: Self-pay

## 2017-12-07 DIAGNOSIS — I7300 Raynaud's syndrome without gangrene: Secondary | ICD-10-CM

## 2017-12-07 DIAGNOSIS — I739 Peripheral vascular disease, unspecified: Secondary | ICD-10-CM

## 2017-12-07 DIAGNOSIS — I872 Venous insufficiency (chronic) (peripheral): Secondary | ICD-10-CM

## 2017-12-25 ENCOUNTER — Encounter: Payer: Self-pay | Admitting: Cardiovascular Disease

## 2017-12-25 ENCOUNTER — Ambulatory Visit (INDEPENDENT_AMBULATORY_CARE_PROVIDER_SITE_OTHER): Payer: BLUE CROSS/BLUE SHIELD | Admitting: Cardiovascular Disease

## 2017-12-25 VITALS — BP 130/86 | HR 71 | Ht 66.0 in | Wt 174.1 lb

## 2017-12-25 DIAGNOSIS — E782 Mixed hyperlipidemia: Secondary | ICD-10-CM | POA: Diagnosis not present

## 2017-12-25 DIAGNOSIS — R0789 Other chest pain: Secondary | ICD-10-CM

## 2017-12-25 NOTE — Progress Notes (Signed)
Cardiology Office Note:    Date:  12/25/2017   ID:  [name], DOB [birth year], MRN [id number]  PCP:  [name], MD  Cardiologist:  Mertie Moores, MD    Referring MD: [name], MD   Problem List 1.  Hyperlipidemia 2.  Left arm pain  3.  Palpitations   Chief Complaint  Patient presents with  . Palpitations       [name] is a 62 y.o. female with a hx of  Palpitations.  I saw [name] many years ago for palpitations  She is seen back today for eval of some left arm pain .   Left arm pain radiated up to her neck and left jaw.   She took an ASA and the pain resolved after an hour.  Described as a left arm burning. Started when she was in bed.     Stinging pain in her left side of neck and jaw.  Associated with heart fluttering.  Was slightly short of breath and was lightheaded.   Does not get much exercise.   Sits at a desk at work .    Has hx of hyperlipidemia  ( last LDL was 188) .   Tried crestor - made her feel like she had the flu.  She has not tried any other statins since that time. Mother had a MI in her 60s.   Dec 25, 2017: [name]  is seen today for follow-up of her palpitations and episode of chest discomfort.  Stress Myoview study on July 13, 2017 was normal.  She has no ischemia and has normal left ventricular systolic function. She had been having some leg discomfort. Dr. Gertie Fey ordered vascular studies   ankle brachial indexes were normal. Still eating more salt than she should . BP is a bit elevated.  No palpitation Lipids were a bit elevated.  - total chol 201,  LDL is 116. we   Past Medical History:  Diagnosis Date  . Adenomatous colon polyp   . Allergy    SEASONAL  . Anemia   . Anxiety   . Arthritis    BILATERAL HANDS,TOES ON RT. FOOT  . Bladder disease   . Chronic interstitial cystitis   . Depressive disorder, not elsewhere classified   . Esophageal reflux   . Fundic gland polyps of stomach, benign   . GERD  (gastroesophageal reflux disease)   . Hemorrhoids   . Hepatic cyst   . Hyperlipidemia   . Irritable bowel syndrome   . Migraine   . Numbness and tingling   . Pulmonary nodules   . Rosacea   . Syncope   . Unspecified constipation   . Unsteady gait    Lips and chin    Past Surgical History:  Procedure Laterality Date  . COLONOSCOPY  10/08/2010   Normal--multiple   . CYSTECTOMY    . DILATION AND CURETTAGE OF UTERUS    . ESOPHAGEAL MANOMETRY N/A 10/22/2012   Procedure: ESOPHAGEAL MANOMETRY (EM);  Surgeon: Sable Feil, MD;  Location: [facility];  Service: Endoscopy;  Laterality: N/A;  . ESOPHAGOGASTRODUODENOSCOPY  10/08/2010  . LAPAROSCOPY    . milk gland removal    . OOPHORECTOMY    . VAGINAL HYSTERECTOMY      Current Medications: Current Meds  Medication Sig  . atorvastatin (LIPITOR) 10 MG tablet Take 1 tablet (10 mg total) by mouth daily.  . clonazePAM (KLONOPIN) 0.5 MG tablet Take 1 tablet (0.5 mg total) by mouth  2 (two) times daily as needed.  . cyclobenzaprine (FLEXERIL) 10 MG tablet Take 1 tablet (10 mg total) by mouth at bedtime as needed for muscle spasms.  Marland Kitchen dicyclomine (BENTYL) 10 MG capsule Take 1 tab every 6 hours as needed for cramping, abdominal spasms.  Marland Kitchen escitalopram (LEXAPRO) 20 MG tablet Take 20 mg by mouth daily.    . meloxicam (MOBIC) 15 MG tablet Take 1 tablet by mouth daily.  . nortriptyline (PAMELOR) 10 MG capsule Take 2 capsules (20 mg total) by mouth at bedtime.  . pantoprazole (PROTONIX) 40 MG tablet Take 1 tablet (40 mg total) by mouth 2 (two) times daily before a meal.  . SUMAtriptan (IMITREX) 100 MG tablet Take 1 tablet (100 mg total) by mouth every 2 (two) hours as needed for migraine. May repeat in 2 hours if headache persists or recurs.  . Vitamin D, Cholecalciferol, 1000 units CAPS Take 5,000 Units by mouth daily.  . Vitamin D, Ergocalciferol, (DRISDOL) 50000 units CAPS capsule Take 50,000 Units by mouth every 7 (seven) days.       Allergies:   Prednisone; Aspirin; and Topamax [topiramate]   Social History   Socioeconomic History  . Marital status: Married    Spouse name: Not on file  . Number of children: 3  . Years of education: 72  . Highest education level: Not on file  Occupational History  . 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 Financial resource strain: Not on file  . Food insecurity:    Worry: Not on file    Inability: Not on file  . Transportation needs:    Medical: Not on file    Non-medical: Not on file  Tobacco Use  . Smoking status: Former Smoker    Packs/day: 0.50    Years: 16.00    Pack years: 8.00    Types: Cigarettes    Last attempt to quit: 08/09/1987    Years since quitting: 30.4  . Smokeless tobacco: Never Used  Substance and Sexual Activity  . Alcohol use: Yes    Alcohol/week: 0.0 oz    Comment: Occasionally  . Drug use: No  . Sexual activity: Not on file  Lifestyle  . Physical activity:    Days per week: Not on file    Minutes per session: Not on file  . Stress: Not on file  Relationships  . Social connections:    Talks on phone: Not on file    Gets together: Not on file    Attends religious service: Not on file    Active member of club or organization: Not on file    Attends meetings of clubs or organizations: Not on file    Relationship status: Not on file  Other Topics Concern  . Not on file  Social History Narrative   Lives at home with husband.   Right-handed.    1 cup caffeine per day.     Family History: The patient's family history includes Bladder Cancer in her father and mother; Diabetes in her paternal grandmother; Heart attack in her mother; Heart disease in her mother; Pancreatic cancer in her paternal grandfather; Stroke in her paternal grandmother. There is no history of Colon cancer. ROS:   Noted in current hx   EKGs/Labs/Other Studies Reviewed:    The following studies were reviewed  today:   EKG:  EKG is  ordered today.  The ekg ordered today demonstrates NSR at 64.  Recent Labs: 10/16/2017: ALT 30  Recent Lipid Panel    Component Value Date/Time   CHOL 201 (H) 10/16/2017 0918   TRIG 130 10/16/2017 0918   HDL 59 10/16/2017 0918   CHOLHDL 3.4 10/16/2017 0918   LDLCALC 116 (H) 10/16/2017 0918    Physical Exam:    Physical Exam: Blood pressure 130/86, pulse 71, height 5\' 6"  (1.676 m), weight 174 lb 1.9 oz (79 kg), SpO2 97 %.  GEN:  Well nourished, well developed in no acute distress HEENT: Normal NECK: No JVD; No carotid bruits LYMPHATICS: No lymphadenopathy CARDIAC: RR, no murmurs, rubs, gallops RESPIRATORY:  Clear to auscultation without rales, wheezing or rhonchi  ABDOMEN: Soft, non-tender, non-distended MUSCULOSKELETAL:  No edema; No deformity  SKIN: Warm and dry NEUROLOGIC:  Alert and oriented x 3   ASSESSMENT:    No diagnosis found. PLAN:    In order of problems listed above:  Left arm pain : She had a Myoview study which was low risk.  There is no ischemia and she has normal left ventricular function..  2.  Hyperlipidemia: Lipid levels were still mildly elevated.  She did not want to increase the dose of her atorvastatin.  She would like to try diet, exercise, and weight loss.  She will be retiring in several weeks and plans on joining the gym and greatly increasing her exercise.  3.  Palpitations: No recent palpitations.   Medication Adjustments/Labs and Tests Ordered: Current medicines are reviewed at length with the patient today.  Concerns regarding medicines are outlined above.  No orders of the defined types were placed in this encounter.  No orders of the defined types were placed in this encounter.   Signed, Mertie Moores, MD  12/25/2017 9:15 AM    [place]

## 2017-12-25 NOTE — Patient Instructions (Signed)
Medication Instructions:  Your physician recommends that you continue on your current medications as directed. Please refer to the Current Medication list given to you today.   Labwork: None Ordered   Testing/Procedures: None Ordered   Follow-Up: Your physician wants you to follow-up in: 1 year with Dr. Nahser.  You will receive a reminder letter in the mail two months in advance. If you don't receive a letter, please call our office to schedule the follow-up appointment.   If you need a refill on your cardiac medications before your next appointment, please call your pharmacy.   Thank you for choosing [facility]! Justine Cossin, RN [phone]    

## 2017-12-27 ENCOUNTER — Encounter: Payer: BLUE CROSS/BLUE SHIELD | Admitting: Vascular Surgery

## 2017-12-27 ENCOUNTER — Encounter (HOSPITAL_COMMUNITY): Payer: BLUE CROSS/BLUE SHIELD

## 2017-12-29 ENCOUNTER — Ambulatory Visit: Payer: BLUE CROSS/BLUE SHIELD | Admitting: Nurse Practitioner

## 2018-01-17 NOTE — Progress Notes (Signed)
[facility]  PATIENT: [name] DOB: 08-08-1956   REASON FOR VISIT: Follow-up for migraine HISTORY FROM: Patient    HISTORY OF PRESENT ILLNESS: HISTORY: [name] is a 62 yo RH female, referred by her PCP Dr. Sherrlyn Hock for evaluation of unsteady gait, left facial numbness,  She has past medical history of migraine, depression anxiety. In November 25 2014 she woke up from overnight sleep, feeling well, but when she tried to get up, she felt unsteady gait, veered to her left side, lasting for 1 hour, she also noticed severe left-sided pounding headaches, with left facial numbness, She was evaluated by her primary care the same day, CAT scan of the brain was normal, I have personally reviewed the film Laboratory evaluation showed normal CBC, CMP, elevated LDL 183, cholesterol 278 She also had normal ultrasound of carotid artery, She reported long-standing history of migraine, much improved since she one time protein free diet since 2009.  UPDATE June 15th 2016:YYShe had nerve block for prologned severe headache in April 28th 2016 for prolonged headaches, which has been very helpful. She had 2 bad headaches since last visit in April 28th, lasting for 2-3 days, could not functions, tramadol helps her some, but only temporarily, also caused constipation I have personally reviewed MRI of the brain in Jan 03 2015 that was normal.. She still complains of daily headache, bilateral frontal area pressure headaches, few times each week, exacerbated into a more severe headaches, with large tinnitus, light noise smell sensitivity, lasting hours to days, Trigger for her headaches are stress, weather change, certain food, such as onion UPDATE August 23 2016YY:She continue has mild daily pressure headaches, couple times a week it would be exacerbated to moderate sometimes severe headaches with associated light noise sensitivity, Maxalt used to be helpful, no significant side effect,  but in the past 2 weeks, Maxalt has become less effective She also complains of depression anxiety, she is under a lot of stress, nortriptyline 20 mg has caused her mood swing, has stopping March 18 2015, she has mild improvement in her mood UPDATE 09/28/2015 CMMs. [name], 62 year old female returns for follow-up she has a history of migraines and was last seen in the office by Dr. Krista Blue 03/31/2015. She has failed nortriptyline in the past and was placed on propranolol at her last visit however she is not longer taking the medication. Imitrex works acutely. She has about 1 headache per week or less presently. She feels her headaches are in better control. She is not aware of any migraine triggers. She returns for reevaluation UPDATE 08/21/2017CM [name], 62 year old female returns for follow-up. She has history of migraine headaches which have worsened since May. She was having about 1 headache a week when last seen in February. She claims she is having about 4-5 headaches per week. She has failed nortriptyline and Inderal in the past. She currently takes Imitrex that works acutely. She returns for reevaluation. She is not aware of any specific migraine triggers UPDATE 06/27/2016 CMMs. [name], 62 year old female returns for follow-up. She has a history of migraine headaches and is currently having one to 2 week. When last seen she was having 4-5 headaches per week. She was placed on Topamax low-dose and thinks she may have had rash on the medication she stopped it. She claims she told her primary care about this and he told her that he was not aware that Topamax caused skin problems and to restart the medication as ordered, however she has been  hesitant to do so. Imitrex works acutely. She returns for reevaluation UPDATE 03/20/2018CM [name], 62 year old female returns for follow-up with history of migraine headaches. She was placed back on Topamax when last seen however she felt the Topamax made her migraines  worse and she took the medication for about 1 month then stopped it. She is aware of her migraine triggers, stress is a big trigger and dairy products are a trigger. Imitrex works acutely. She has failed nortriptyline in the past. She had no headaches for the much month of January and  February. One bad headache so far for the month of March. She continues to work full-time and says her job is very stressful. She does little exercise. She returns for reevaluation  UPDATE 09/24/2018CM [name], 62 year old female returns for follow-up with history  of migraine headaches. When last seen she had stopped her nortriptyline but was taking Imitrex acutely however her migraines worsened and she started back on her nortriptyline taking 2 pills at night and she has only had one headache in 2 weeks. That headache was relieved with Imitrex. She avoids migraine triggers. She continues to work full-time and says her job is very stressful she gets little exercise. She has failed Topamax in the past. She returns for reevaluation UPDATE 6/13/2019CM [name], 62 year old female returns for follow-up with history of migraine headaches.  She has had one headache in the last month.  She is currently on nortriptyline 20 mg at bedtime and takes Imitrex acutely with relief.  She recently retired.  She returns for reevaluation and refills  REVIEW OF SYSTEMS: Full 14 system review of systems performed and notable only for those listed, all others are neg:  Constitutional: neg  Cardiovascular: neg Ear/Nose/Throat: Ringing in the ears Skin: neg Eyes: Light sensitivity, redness Respiratory: neg Gastroitestinal: Constipation Hematology/Lymphatic: neg  Endocrine: Intolerance to cold Musculoskeletal: Joint pain, back pain Allergy/Immunology: Environmental allergies ,food allergies Neurological: Headache Psychiatric: Depression and anxiety Sleep : Snoring   ALLERGIES: Allergies  Allergen Reactions  . Prednisone Other (See  Comments)    Extreme fatigue  . Aspirin Nausea Only  . Topamax [Topiramate] Rash    And hives     HOME MEDICATIONS: Outpatient Medications Prior to Visit  Medication Sig Dispense Refill  . atorvastatin (LIPITOR) 10 MG tablet Take 1 tablet (10 mg total) by mouth daily. 90 tablet 2  . clonazePAM (KLONOPIN) 0.5 MG tablet Take 1 tablet (0.5 mg total) by mouth 2 (two) times daily as needed. 180 tablet 2  . cyclobenzaprine (FLEXERIL) 10 MG tablet Take 1 tablet (10 mg total) by mouth at bedtime as needed for muscle spasms. 20 tablet 0  . dicyclomine (BENTYL) 10 MG capsule Take 1 tab every 6 hours as needed for cramping, abdominal spasms. 40 capsule 3  . escitalopram (LEXAPRO) 20 MG tablet Take 20 mg by mouth daily.      . meloxicam (MOBIC) 15 MG tablet Take 1 tablet by mouth daily.    . nortriptyline (PAMELOR) 10 MG capsule Take 2 capsules (20 mg total) by mouth at bedtime. 180 capsule 2  . pantoprazole (PROTONIX) 40 MG tablet Take 1 tablet (40 mg total) by mouth 2 (two) times daily before a meal. 180 tablet 1  . SUMAtriptan (IMITREX) 100 MG tablet Take 1 tablet (100 mg total) by mouth every 2 (two) hours as needed for migraine. May repeat in 2 hours if headache persists or recurs. 30 tablet 2  . Vitamin D, Cholecalciferol, 1000 units CAPS Take  5,000 Units by mouth daily.    . Vitamin D, Ergocalciferol, (DRISDOL) 50000 units CAPS capsule Take 50,000 Units by mouth every 7 (seven) days.   2   No facility-administered medications prior to visit.     PAST MEDICAL HISTORY: Past Medical History:  Diagnosis Date  . Adenomatous colon polyp   . Allergy    SEASONAL  . Anemia   . Anxiety   . Arthritis    BILATERAL HANDS,TOES ON RT. FOOT  . Bladder disease   . Chronic interstitial cystitis   . Depressive disorder, not elsewhere classified   . Esophageal reflux   . Fundic gland polyps of stomach, benign   . GERD (gastroesophageal reflux disease)   . Hemorrhoids   . Hepatic cyst   .  Hyperlipidemia   . Irritable bowel syndrome   . Migraine   . Numbness and tingling   . Pulmonary nodules   . Rosacea   . Syncope   . Unspecified constipation   . Unsteady gait    Lips and chin    PAST SURGICAL HISTORY: Past Surgical History:  Procedure Laterality Date  . COLONOSCOPY  10/08/2010   Normal--multiple   . CYSTECTOMY    . DILATION AND CURETTAGE OF UTERUS    . ESOPHAGEAL MANOMETRY N/A 10/22/2012   Procedure: ESOPHAGEAL MANOMETRY (EM);  Surgeon: Sable Feil, MD;  Location: [facility];  Service: Endoscopy;  Laterality: N/A;  . ESOPHAGOGASTRODUODENOSCOPY  10/08/2010  . LAPAROSCOPY    . milk gland removal    . OOPHORECTOMY    . VAGINAL HYSTERECTOMY      FAMILY HISTORY: Family History  Problem Relation Age of Onset  . Heart attack Mother   . Bladder Cancer Mother   . Heart disease Mother   . Bladder Cancer Father   . Pancreatic cancer Paternal Grandfather   . Diabetes Paternal Grandmother   . Stroke Paternal Grandmother   . Colon cancer Neg Hx     SOCIAL HISTORY: Social History   Socioeconomic History  . Marital status: Married    Spouse name: Not on file  . Number of children: 3  . Years of education: 78  . Highest education level: Not on file  Occupational History  . 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 Financial resource strain: Not on file  . Food insecurity:    Worry: Not on file    Inability: Not on file  . Transportation needs:    Medical: Not on file    Non-medical: Not on file  Tobacco Use  . Smoking status: Former Smoker    Packs/day: 0.50    Years: 16.00    Pack years: 8.00    Types: Cigarettes    Last attempt to quit: 08/09/1987    Years since quitting: 30.4  . Smokeless tobacco: Never Used  Substance and Sexual Activity  . Alcohol use: Yes    Alcohol/week: 0.0 oz    Comment: Occasionally  . Drug use: No  . Sexual activity: Not on file  Lifestyle  . Physical activity:      Days per week: Not on file    Minutes per session: Not on file  . Stress: Not on file  Relationships  . Social connections:    Talks on phone: Not on file    Gets together: Not on file    Attends religious service: Not on file    Active member of club or organization:  Not on file    Attends meetings of clubs or organizations: Not on file    Relationship status: Not on file  . Intimate partner violence:    Fear of current or ex partner: Not on file    Emotionally abused: Not on file    Physically abused: Not on file    Forced sexual activity: Not on file  Other Topics Concern  . Not on file  Social History Narrative   Lives at home with husband.   Right-handed.    1 cup caffeine per day.  Marland Kitchen   PHYSICAL EXAM  Vitals:   01/18/18 0823  BP: (!) 154/89  Pulse: 63  Weight: 173 lb (78.5 kg)  Height: 5\' 6"  (1.676 m)   Body mass index is 27.92 kg/m.  Physical Exam General: well developed, well nourished, seated, in no evident distress Head: head normocephalic and atraumatic. Oropharynx benign Neck: supple  Cardiovascular: regular rate and rhythm, no murmurs  Neurologic Exam Mental Status: Awake and fully alert. Oriented to place and time. Follows all commands. Speech and language normal.   Cranial Nerves:  Pupils equal, briskly reactive to light. Extraocular movements full without nystagmus. Visual fields full to confrontation. Hearing intact and symmetric to finger snap. Facial sensation intact. Face, tongue, palate move normally and symmetrically. Neck flexion and extension normal.  Motor: Normal bulk and tone. Normal strength in all tested extremity muscles.No focal weakness Sensory.: intact to touch in the upper and lower extremities.  Coordination: Rapid alternating movements normal in all extremities. Finger-to-nose and heel-to-shin performed accurately bilaterally. Gait and Station: Arises from chair without difficulty. Stance is normal. Gait demonstrates normal stride  length and balance . Able to heel, toe and tandem walk without difficulty.  Reflexes: 2+ and symmetric. Toes downgoing.    DIAGNOSTIC DATA (LABS, IMAGING, TESTING) - I reviewed patient records, labs, notes, testing and imaging myself where available.  Lab Results  Component Value Date   WBC 6.9 04/19/2016   HGB 15.1 04/19/2016   HCT 45.2 04/19/2016   MCV 91.2 04/19/2016   PLT 216 04/19/2016   ASSESSMENT AND PLAN  62 y.o. year old female  has a past medical history of  chronic migraine, depression and anxiety and some daytime sleepiness.  Propanolol was not helpful . Topamax caused increased headaches.  She is currently on nortriptyline 20 mg at night with one migraine in the last month relieved with Imitrex      PLAN: Continue Imitrex  100 mg dose will refill Continue nortriptyline 20 mg at bedtime will refill Avoid  known triggers for migraine Follow-up yearly  I spent 20 min in total face to face time with the patient more than 50% of which was spent counseling and coordination of care, reviewing test results reviewing medications and discussing and reviewing the diagnosis of migraine  and further treatment options.If headaches increase again will increase nortriptyline  but for now things are stable. Dennie Bible, [facility], [facility], APRN  [facility] [address] [place], [place] [address] [phone]     ,

## 2018-01-18 ENCOUNTER — Ambulatory Visit (INDEPENDENT_AMBULATORY_CARE_PROVIDER_SITE_OTHER): Payer: BLUE CROSS/BLUE SHIELD | Admitting: Nurse Practitioner

## 2018-01-18 ENCOUNTER — Telehealth: Payer: Self-pay | Admitting: Nurse Practitioner

## 2018-01-18 ENCOUNTER — Encounter: Payer: Self-pay | Admitting: Nurse Practitioner

## 2018-01-18 ENCOUNTER — Telehealth: Payer: Self-pay | Admitting: Pharmacist

## 2018-01-18 VITALS — BP 154/89 | HR 63 | Ht 66.0 in | Wt 173.0 lb

## 2018-01-18 DIAGNOSIS — G43909 Migraine, unspecified, not intractable, without status migrainosus: Secondary | ICD-10-CM

## 2018-01-18 MED ORDER — SUMATRIPTAN SUCCINATE 100 MG PO TABS: 100.0000 mg | ORAL_TABLET | ORAL | 3 refills | Status: AC | PRN

## 2018-01-18 MED ORDER — NORTRIPTYLINE HCL 10 MG PO CAPS: 20.0000 mg | ORAL_CAPSULE | Freq: Every day | ORAL | 3 refills | Status: AC

## 2018-01-18 MED ORDER — ATORVASTATIN CALCIUM 20 MG PO TABS: 20.0000 mg | ORAL_TABLET | Freq: Every day | ORAL | 3 refills | Status: DC

## 2018-01-18 NOTE — Telephone Encounter (Signed)
Patient states she will call us to schedule her one year follow up per Hassell Done, NP.

## 2018-01-18 NOTE — Telephone Encounter (Signed)
Pt called clinic and reported she was willing to try increasing her atorvastatin from 10 to 20mg  daily after discussing with Dr Acie Fredrickson at her most recent office visit. New rx sent to mail order pharmacy for atorvastatin 20mg  daily. Pt reports her PCP will check her cholesterol in 3 months.

## 2018-01-18 NOTE — Patient Instructions (Signed)
Continue Imitrex  100 mg dose will refill Continue nortriptyline 20 mg at bedtime will refill Avoid  known triggers for migraine Follow-up yearly

## 2018-01-19 NOTE — Progress Notes (Signed)
I have reviewed and agreed above plan. 

## 2018-01-24 ENCOUNTER — Encounter: Payer: Self-pay | Admitting: Vascular Surgery

## 2018-01-24 ENCOUNTER — Ambulatory Visit (INDEPENDENT_AMBULATORY_CARE_PROVIDER_SITE_OTHER): Payer: BLUE CROSS/BLUE SHIELD | Admitting: Vascular Surgery

## 2018-01-24 ENCOUNTER — Ambulatory Visit (HOSPITAL_COMMUNITY)
Admission: RE | Admit: 2018-01-24 | Discharge: 2018-01-24 | Disposition: A | Discharge status: Home / Self Care | Payer: BLUE CROSS/BLUE SHIELD | Source: Ambulatory Visit | Attending: Vascular Surgery | Admitting: Vascular Surgery

## 2018-01-24 VITALS — BP 156/90 | HR 60 | Temp 97.3°F | Resp 20 | Ht 66.0 in | Wt 172.0 lb

## 2018-01-24 DIAGNOSIS — I872 Venous insufficiency (chronic) (peripheral): Secondary | ICD-10-CM | POA: Diagnosis not present

## 2018-01-24 DIAGNOSIS — I7300 Raynaud's syndrome without gangrene: Secondary | ICD-10-CM

## 2018-01-24 DIAGNOSIS — I739 Peripheral vascular disease, unspecified: Secondary | ICD-10-CM

## 2018-01-24 NOTE — Progress Notes (Signed)
Established Venous Insufficiency   History of Present Illness   [name] is a 62 y.o. (1956/04/04) female who presents with chief complaint: none.  Pt was evaluated previously for arterial insufficiency: ABI was normal.  Pt still has some limited cyanosis in L 2nd and 3rd toe cyanosis.  There was some evidence of varicosities in both legs.  The patient's symptoms have NOT progressed.  The patient's symptoms are: cold trigger toe color changes with numbness.  The patient notes cyanosis in both feet with any numbness currently.  The patient's PMH, PSH, SH, and FamHx were reviewed on 01/24/18 are unchanged from 12/06/17.  Current Outpatient Medications  Medication Sig Dispense Refill  . atorvastatin (LIPITOR) 20 MG tablet Take 1 tablet (20 mg total) by mouth daily. 90 tablet 3  . clonazePAM (KLONOPIN) 0.5 MG tablet Take 1 tablet (0.5 mg total) by mouth 2 (two) times daily as needed. 180 tablet 2  . cyclobenzaprine (FLEXERIL) 10 MG tablet Take 1 tablet (10 mg total) by mouth at bedtime as needed for muscle spasms. 20 tablet 0  . dicyclomine (BENTYL) 10 MG capsule Take 1 tab every 6 hours as needed for cramping, abdominal spasms. 40 capsule 3  . escitalopram (LEXAPRO) 20 MG tablet Take 20 mg by mouth daily.      . meloxicam (MOBIC) 15 MG tablet Take 1 tablet by mouth daily.    . nortriptyline (PAMELOR) 10 MG capsule Take 2 capsules (20 mg total) by mouth at bedtime. 180 capsule 3  . pantoprazole (PROTONIX) 40 MG tablet Take 1 tablet (40 mg total) by mouth 2 (two) times daily before a meal. 180 tablet 1  . SUMAtriptan (IMITREX) 100 MG tablet Take 1 tablet (100 mg total) by mouth every 2 (two) hours as needed for migraine. May repeat in 2 hours if headache persists or recurs. 30 tablet 3  . Vitamin D, Cholecalciferol, 1000 units CAPS Take 5,000 Units by mouth daily.    . Vitamin D, Ergocalciferol, (DRISDOL) 50000 units CAPS capsule Take 50,000 Units by mouth every 7 (seven) days.   2   No  current facility-administered medications for this visit.     Allergies  Allergen Reactions  . Prednisone Other (See Comments)    Extreme fatigue  . Aspirin Nausea Only  . Topamax [Topiramate] Rash    And hives     On ROS today: no paraesthesia or anesthesia in feet or hands, no swelling in feet   Physical Examination   Vitals:   01/24/18 1209  BP: (!) 156/90  Pulse: 60  Resp: 20  Temp: (!) 97.3 F (36.3 C)  TempSrc: Oral  SpO2: 97%  Weight: 172 lb (78 kg)  Height: 5\' 6"  (1.676 m)   Body mass index is 27.76 kg/m.  General Alert, O x 3, WD, NAD  Pulmonary Sym exp, good B air movt, CTA B  Cardiac RRR, Nl S1, S2, no Murmurs, No rubs, No S3,S4  Vascular Vessel Right Left  Radial Palpable Palpable  Brachial Palpable Palpable  Carotid Palpable, No Bruit Palpable, No Bruit  Aorta Not palpable N/A  Femoral Palpable Palpable  Popliteal Not palpable Not palpable  PT Palpable Palpable  DP Palpable Palpable    Gastro- intestinal soft, non-distended, non-tender to palpation, No guarding or rebound, no HSM, no masses, no CVAT B, No palpable prominent aortic pulse,    Musculo- skeletal M/S 5/5 throughout  , Extremities without ischemic changes  , No edema present, Varicosities present: L >  R, No Lipodermatosclerosis present, both feet with cyanosis that resolves with elevation  Neurologic Cranial nerves grossly intact , Pain and light touch intact in extremities , Motor exam as listed above     Non-Invasive Vascular Imaging   BLE Venous Insufficiency Duplex (01/24/2018):   RLE:   No DVT and SVT,   + GSV reflux: 1.7-5.0 mm  no SSV reflux,  no deep venous reflux  LLE:  no DVT and SVT,   + GSV reflux: 1.3-6.3 mm  no SSV reflux,  no deep venous reflux   Medical Decision Making   KIONDRA CAICEDO is a 62 y.o. female who presents with: history of Buerger's disease, no evidence of PAD, possible Raynaud's syndrome, limited B GSV chronic venous insufficiency     Given the limited sx the patient has from her CVI, I doubt EVLA would improve her sx which are likely unrelated to her CVI  We discussed use of compression stockings if she develop leg swelling.  We discussed again that any lab or angiographic studies would have more yield when done when patient is actively having her "Raynaud" labeled sx.  Thank you for allowing [place] to participate in this patient's care.   Adele Barthel, MD, FACS [facility] Office: [phone] Pager: [phone]

## 2018-04-15 IMAGING — CT CT CHEST W/O CM
2 of 3 series · 15 of 36 positions shown, 18 images · non-contrast
Comparison: 02/16/2015

CLINICAL DATA: Followup indeterminate pulmonary nodules.

EXAM:
CT CHEST WITHOUT CONTRAST
TECHNIQUE: Multidetector CT imaging of the chest was performed following the
standard protocol without IV contrast.

[Series 2: thorax · axial · 0.67mm/px · z∈[-288,-30]mm · 12 of 153 slices shown, 15 images]
[im 12/153  mediastinal]
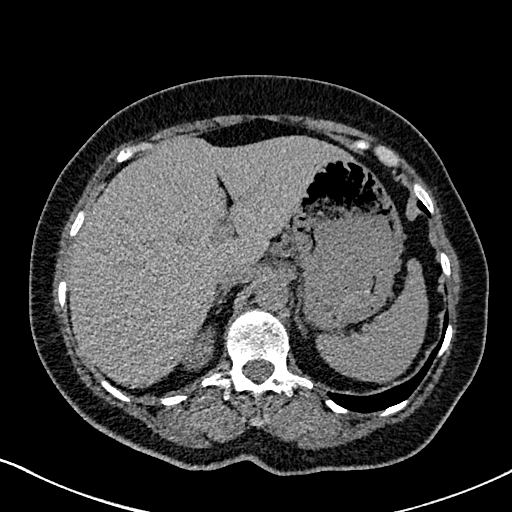
[im 12/153  lung]
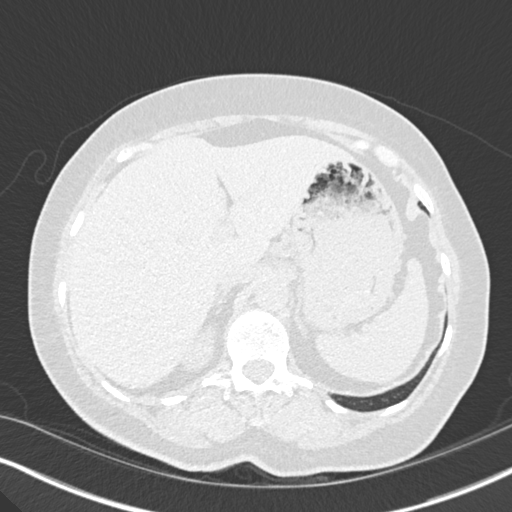
[im 23/153  lung]
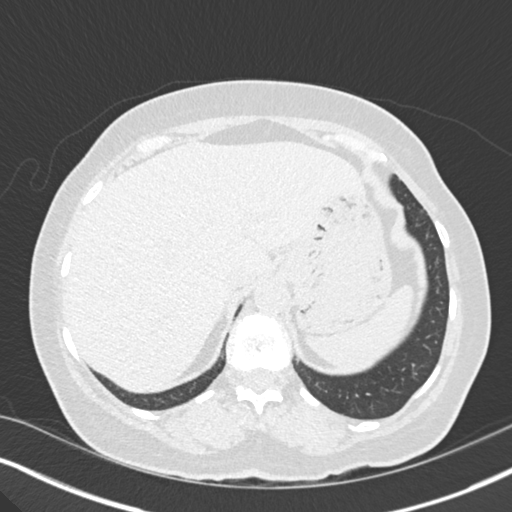
[im 34/153  lung]
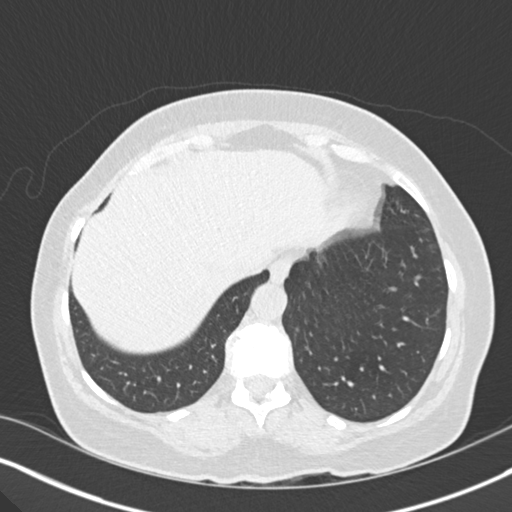
[im 46/153  lung]
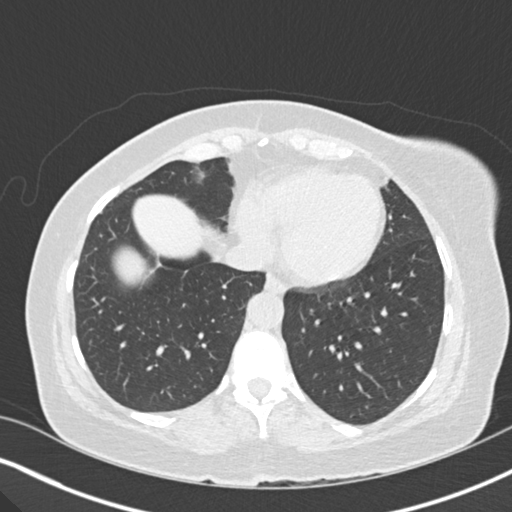
[im 57/153  mediastinal]
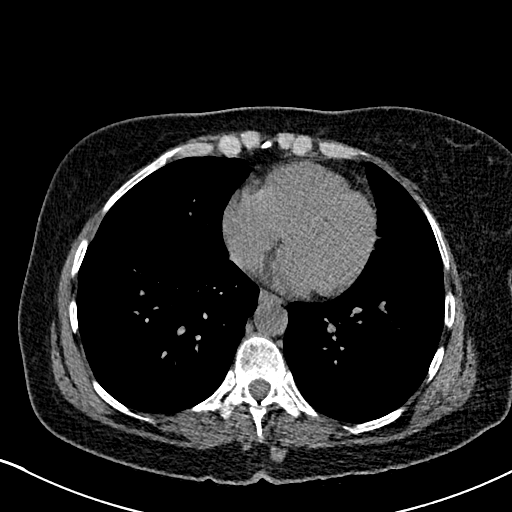
[im 57/153  lung]
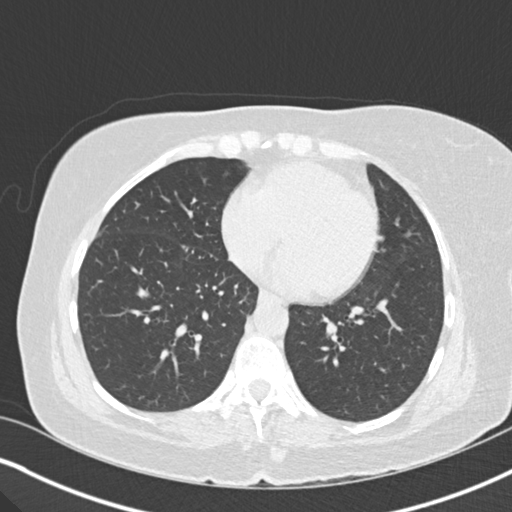
[im 68/153  lung]
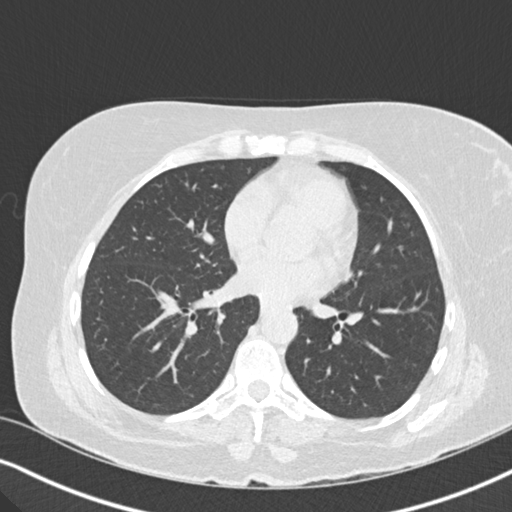
[im 85/153  lung]
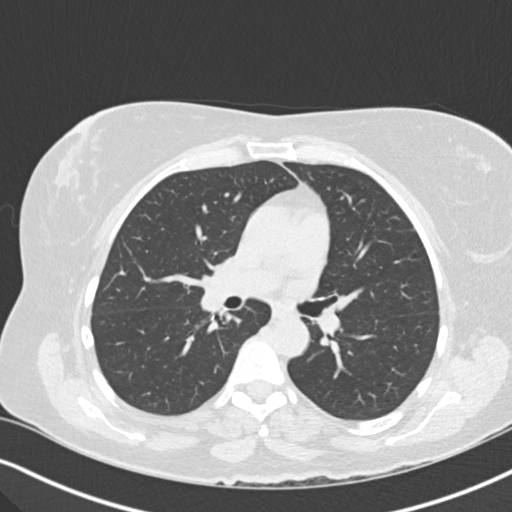
[im 96/153  lung]
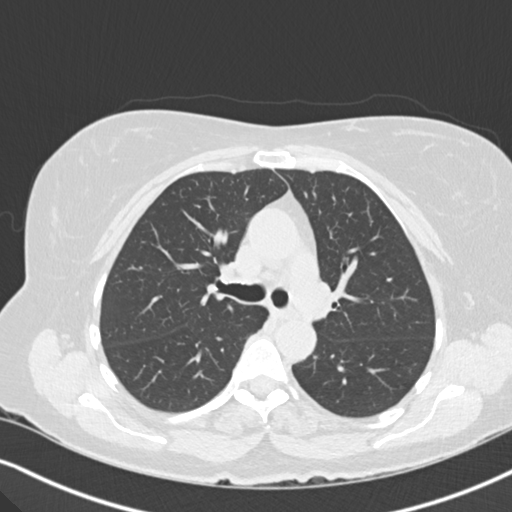
[im 107/153  mediastinal]
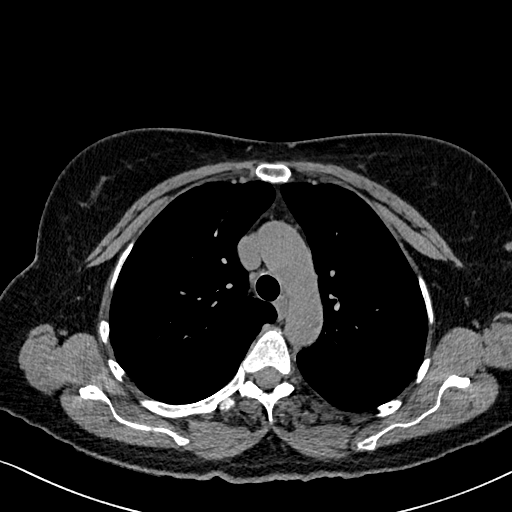
[im 107/153  lung]
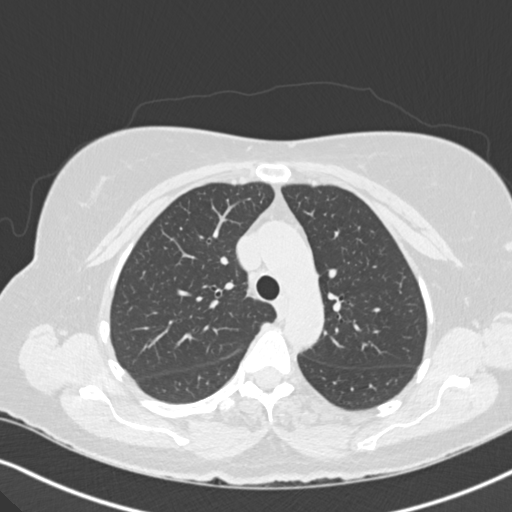
[im 119/153  lung]
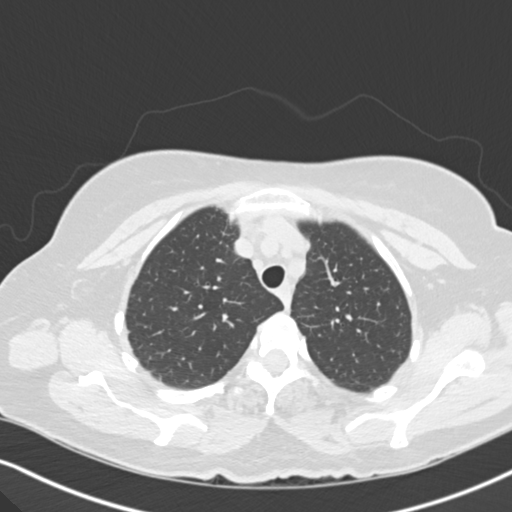
[im 130/153  lung]
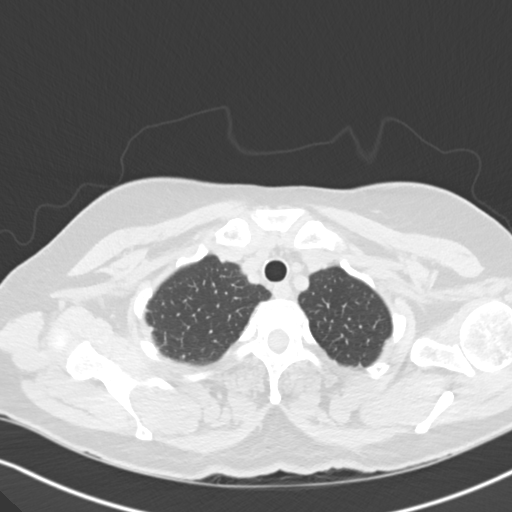
[im 141/153  lung]
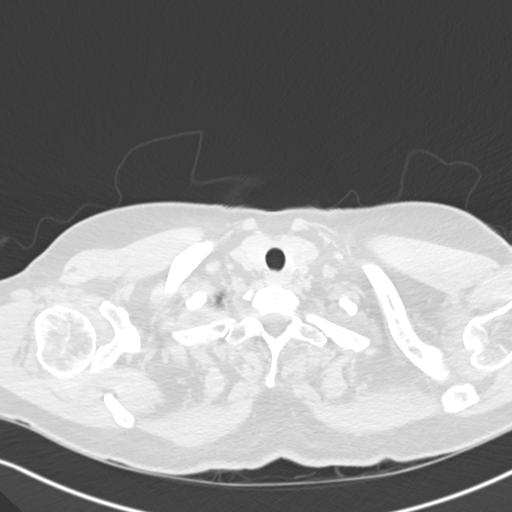

[Series 5: coronal · coronal · 0.54mm/px · 3 of 113 slices shown]
[im 23/113  lung]
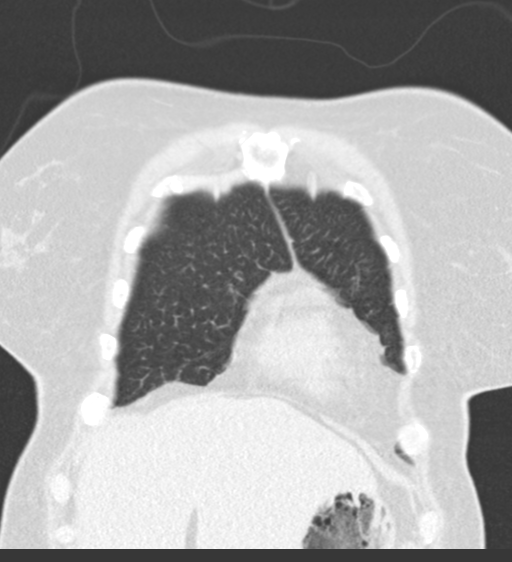
[im 45/113  lung]
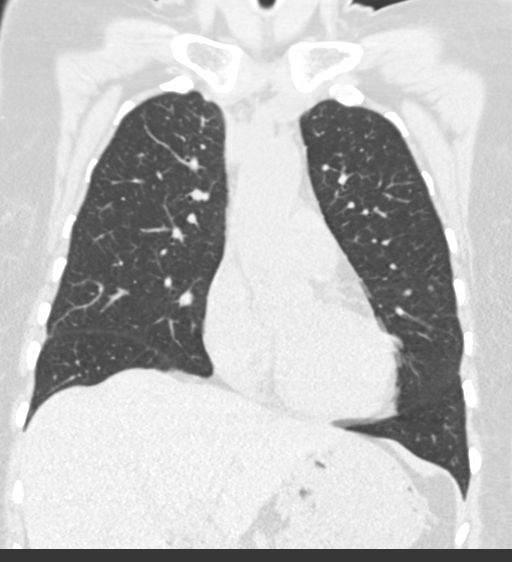
[im 68/113  lung]
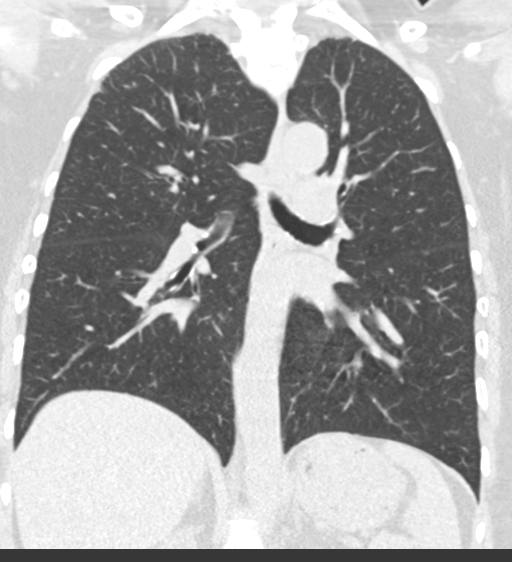

[15 of 36 positions shown; findings below may reference images not displayed]

FINDINGS: Cardiovascular:  Normal heart size.  Aortic atherosclerosis noted.

Mediastinum/Lymph Nodes: No masses or pathologically enlarged lymph
nodes identified on this unenhanced exam. Stable sub-cm left thyroid
lobe nodule again noted.

Lungs/Pleura: 3 mm pulmonary nodules in the right middle lobe and
both lower lobes remain stable since previous study. No new or
enlarging pulmonary nodules or masses are identified. No evidence of
pulmonary infiltrate or pleural effusion.

Musculoskeletal/Soft Tissues: No suspicious bone lesions or other
significant chest wall abnormality.

Upper Abdomen:  Unremarkable.
IMPRESSION: Stable sub-cm bilateral pulmonary nodules, consistent with benign
etiology. No further imaging followup required. This recommendation
follows the consensus statement: Guidelines for Management of Small
Pulmonary Nodules Detected on CT Images: From the [HOSPITAL]
5761; published online before print (10.1148/radiol.2378777752).

Aortic atherosclerosis noted.

## 2019-01-29 ENCOUNTER — Telehealth: Payer: Self-pay | Admitting: Internal Medicine

## 2019-01-30 NOTE — Telephone Encounter (Signed)
Yes, she would need to be seen. She has not been seen since 06/27/16, 2.5 years ago. She can be scheduled with an APP if needed since Dr Hilarie Fredrickson has no openings at this time. In the meantime, she may purchase an OTC PPI if needed.

## 2019-01-30 NOTE — Telephone Encounter (Signed)
appt scheduled w/Paula

## 2019-02-14 ENCOUNTER — Ambulatory Visit: Payer: Medicare PPO | Admitting: Nurse Practitioner

## 2019-02-14 ENCOUNTER — Encounter: Payer: Self-pay | Admitting: Nurse Practitioner

## 2019-02-14 ENCOUNTER — Telehealth: Payer: Self-pay

## 2019-02-14 ENCOUNTER — Other Ambulatory Visit: Payer: Self-pay

## 2019-02-14 VITALS — BP 126/74 | HR 69 | Temp 98.1°F | Ht 66.0 in | Wt 163.2 lb

## 2019-02-14 DIAGNOSIS — K219 Gastro-esophageal reflux disease without esophagitis: Secondary | ICD-10-CM | POA: Diagnosis not present

## 2019-02-14 MED ORDER — PANTOPRAZOLE SODIUM 40 MG PO TBEC: 40.0000 mg | DELAYED_RELEASE_TABLET | Freq: Two times a day (BID) | ORAL | 0 refills | Status: DC

## 2019-02-14 MED ORDER — PANTOPRAZOLE SODIUM 40 MG PO TBEC: 40.0000 mg | DELAYED_RELEASE_TABLET | Freq: Two times a day (BID) | ORAL | 3 refills | Status: DC

## 2019-02-14 NOTE — Telephone Encounter (Signed)
Covid-19 screening questions   Do you now or have you had a fever in the last 14 days? No  Do you have any respiratory symptoms of shortness of breath or cough now or in the last 14 days? No  Do you have any family members or close contacts with diagnosed or suspected Covid-19 in the past 14 days? No  Have you been tested for Covid-19 and found to be positive? No        

## 2019-02-14 NOTE — Patient Instructions (Signed)
We have sent the following medications to your pharmacy for you to pick up at your convenience:pantoprazole.

## 2019-02-14 NOTE — Progress Notes (Addendum)
Chief Complaint:    GERD follow up   IMPRESSION and PLAN:    63 year old female with chronic GERD, IBS, history of colon polyps.  1.  GERD-symptoms fairly well controlled on Protonix which she takes 1-2 times a day.  Need for second dose typically depends on what she eats (spicy foods, certain vegetables such as peppers).  -Refill Protonix 40 mg. Per her request will give a 90-day supply for mail out pharmacy.  Will provide one year's worth of refill  2. Hx of adenomatous colon polyps - surveillance colonoscopy due Marc 2021.   25 minutes spent with the patient today. Greater than 50% was spent in counseling and coordination of care with the patient   Addendum: Reviewed and agree with assessment and management plan. Jerene Bears, MD    HPI:     Patient is a 63 year old female known to Dr. Hilarie Fredrickson for history of chronic GERD, IBS, adenomatous colon polyps , fundic gland polyps and iron deficiency anemia.  She had an EGD March 2016 for evaluation of iron deficiency anemia. Exam was normal,  no esophagitis,  Barrett's or other findings.   [name] has been doing well on Protonix.  She takes it anywhere from 1-2 times a day.  The need for a second dose usually depends on what she eats, spicy foods are particularly problematic resulting in heartburn.  Some vegetables like peppers and onions cause LUQ so has to be particularly careful with some vegetables.  She is requesting a 90-day supply of Protonix for mail out pharmacy.  She has no GI complaints  Review of systems:     No chest pain, no SOB, no fevers, no urinary sx   Past Medical History:  Diagnosis Date  . Adenomatous colon polyp   . Allergy    SEASONAL  . Anemia   . Anxiety   . Arthritis    BILATERAL HANDS,TOES ON RT. FOOT  . Bladder disease   . Chronic interstitial cystitis   . Depressive disorder, not elsewhere classified   . Esophageal reflux   . Fundic gland polyps of stomach, benign   . GERD  (gastroesophageal reflux disease)   . Hemorrhoids   . Hepatic cyst   . Hyperlipidemia   . Irritable bowel syndrome   . Migraine   . Numbness and tingling   . Pulmonary nodules   . Rosacea   . Unspecified constipation   . Unsteady gait    Lips and chin    Patient's surgical history, family medical history, social history, medications and allergies were all reviewed in Epic   Creatinine clearance cannot be calculated (Patient's most recent lab result is older than the maximum 21 days allowed.)  Current Outpatient Medications  Medication Sig Dispense Refill  . AMBULATORY NON FORMULARY MEDICATION 1 tablet daily. [facility]    . CALCIUM-VITAMIN D PO Take 1 tablet by mouth daily. Calcium 250mg  Vitamin d3 100 units magnesium 50mg     . clonazePAM (KLONOPIN) 0.5 MG tablet Take 1 tablet (0.5 mg total) by mouth 2 (two) times daily as needed. 180 tablet 2  . cyclobenzaprine (FLEXERIL) 10 MG tablet Take 1 tablet (10 mg total) by mouth at bedtime as needed for muscle spasms. 20 tablet 0  . dicyclomine (BENTYL) 10 MG capsule Take 1 tab every 6 hours as needed for cramping, abdominal spasms. (Patient taking differently: Take 10 mg by mouth as needed. Take 1 tab every 6 hours as needed for cramping, abdominal  spasms.) 40 capsule 3  . escitalopram (LEXAPRO) 20 MG tablet Take 20 mg by mouth daily.      . meloxicam (MOBIC) 15 MG tablet Take 1 tablet by mouth as needed.     . Multiple Vitamins-Minerals (MULTIVITAMIN WOMENS 50+ ADV PO) Take 1 tablet by mouth daily.    . nortriptyline (PAMELOR) 10 MG capsule Take 2 capsules (20 mg total) by mouth at bedtime. 180 capsule 3  . pantoprazole (PROTONIX) 40 MG tablet Take 1 tablet (40 mg total) by mouth 2 (two) times daily before a meal. (Patient taking differently: Take 40 mg by mouth daily. ) 180 tablet 1  . SUMAtriptan (IMITREX) 100 MG tablet Take 1 tablet (100 mg total) by mouth every 2 (two) hours as needed for migraine. May repeat in 2 hours if  headache persists or recurs. 30 tablet 3  . atorvastatin (LIPITOR) 20 MG tablet Take 1 tablet (20 mg total) by mouth daily. (Patient not taking: Reported on 02/14/2019) 90 tablet 3  . Vitamin D, Cholecalciferol, 1000 units CAPS Take 5,000 Units by mouth daily.     No current facility-administered medications for this visit.     Physical Exam:     BP 126/74   Pulse 69   Temp 98.1 F (36.7 C)   Ht 5\' 6"  (1.676 m)   Wt 163 lb 4 oz (74 kg)   BMI 26.35 kg/m   GENERAL:  Pleasant female in NAD PSYCH: : Cooperative, normal affect EENT:  conjunctiva pink, mucous membranes moist, neck supple without masses CARDIAC:  RRR, murmur heard, no peripheral edema PULM: Normal respiratory effort, lungs CTA bilaterally, no wheezing ABDOMEN:  Nondistended, soft, nontender. No obvious masses, no hepatomegaly,  normal bowel sounds SKIN:  turgor, no lesions seen Musculoskeletal:  Normal muscle tone, normal strength NEURO: Alert and oriented x 3, no focal neurologic deficits   Tye Savoy , NP 02/14/2019, 2:41 PM

## 2019-12-04 ENCOUNTER — Encounter: Payer: Medicare PPO | Admitting: Internal Medicine

## 2019-12-09 ENCOUNTER — Ambulatory Visit: Payer: Medicare PPO | Admitting: Orthopedic Surgery

## 2019-12-09 ENCOUNTER — Other Ambulatory Visit: Payer: Self-pay

## 2019-12-09 ENCOUNTER — Encounter: Payer: Self-pay | Admitting: Orthopedic Surgery

## 2019-12-09 ENCOUNTER — Ambulatory Visit (INDEPENDENT_AMBULATORY_CARE_PROVIDER_SITE_OTHER): Payer: Medicare PPO

## 2019-12-09 VITALS — Ht 66.0 in | Wt 163.0 lb

## 2019-12-09 DIAGNOSIS — M79671 Pain in right foot: Secondary | ICD-10-CM

## 2019-12-09 NOTE — Progress Notes (Signed)
Office Visit Note   Patient: [name]           Date of Birth: 06-Mar-1956           MRN: [id number] Visit Date: 12/09/2019              Requested by: [name], MD [address] [place] [address] PCP: [name], MD  Chief Complaint  Patient presents with  . Right Foot - Pain      HPI: Patient is a 64 year old woman who presents complaining of 5-year history of pain right great toe MTP joint.  She has tried shoewear modifications without relief she states she now only can wear open toed shoes secondary to pain.  Patient states she has not smoked in 30 years.  Assessment & Plan: Visit Diagnoses:  1. Pain in right foot     Plan: Discussed operative versus nonoperative interventions.  With the complete joint space collapse and large osteophytic bone spurs discussed that her best option would be to proceed with MTP fusion.  Risks and benefits of surgery were discussed postoperative care was discussed patient states she would like to proceed with surgery at this time.  We will plan for outpatient surgery at [facility] day [facility].  Follow-Up Instructions: Return in about 1 week (around 12/16/2019) for Follow-up 1 week postoperatively.Manson Passey Exam  Patient is alert, oriented, no adenopathy, well-dressed, normal affect, normal respiratory effort. Examination patient has a good dorsalis pedis pulse there is no redness no cellulitis she does have prominent bony spurs and swelling around the MTP joint.  She has essentially no range of motion of the toe and pain with attempted range of motion.  Axial compression is also painful.  Patient is developing some numbness distal to the MTP joint secondary to nerve impingement.  Imaging: XR Foot 2 Views Right  Result Date: 12/09/2019 2 view radiographs of the right foot shows complete joint space collapse of the MTP joint with dorsal osteophytic bone spurs with advanced hallux rigidus.  No images are attached to the  encounter.  Labs: Lab Results  Component Value Date   ESRSEDRATE 34 (H) 09/27/2011   ESRSEDRATE 34 (H) 10/06/2009   ESRSEDRATE 28 (H) 09/08/2009     Lab Results  Component Value Date   ALBUMIN 4.4 10/16/2017   ALBUMIN 4.0 04/22/2015   ALBUMIN 4.0 12/24/2014    Lab Results  Component Value Date   MG 2.0 10/04/2012   MG 2.0 09/14/2010   MG 2.3 10/06/2009   Lab Results  Component Value Date   VD25OH 30.31 04/16/2014   VD25OH 21 (L) 10/04/2012   VD25OH 35 09/27/2011    No results found for: PREALBUMIN CBC EXTENDED Latest Ref Rng & Units 04/19/2016 10/20/2015 04/22/2015  WBC 3.9 - 10.3 10e3/uL 6.9 6.8 7.1  RBC 3.70 - 5.45 10e6/uL 4.96 5.06 4.73  HGB 11.6 - 15.9 g/dL 15.1 15.3 14.7  HCT 34.8 - 46.6 % 45.2 46.5 43.8  PLT 145 - 400 10e3/uL 216 216 202  NEUTROABS 1.5 - 6.5 10e3/uL 4.4 3.4 4.0  LYMPHSABS 0.9 - 3.3 10e3/uL 1.8 2.6 2.3     Body mass index is 26.31 kg/m.  Orders:  Orders Placed This Encounter  Procedures  . XR Foot 2 Views Right   No orders of the defined types were placed in this encounter.    Procedures: No procedures performed  Clinical Data: No additional findings.  ROS:  All other systems negative,  except as noted in the HPI. Review of Systems  Objective: Vital Signs: Ht 5\' 6"  (1.676 m)   Wt 163 lb (73.9 kg)   BMI 26.31 kg/m   Specialty Comments:  No specialty comments available.  PMFS History: Patient Active Problem List   Diagnosis Date Noted  . Chronic venous insufficiency 12/06/2017  . Raynauds syndrome 12/06/2017  . Hyperlipidemia 08/06/2017  . 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 Facet arthropathy 02/22/2016  . Migraine 09/28/2015  . Clubbed fingers 03/24/2015  . Multiple pulmonary nodules 03/23/2015  . Iron deficiency anemia 09/24/2014  . Non-celiac gluten sensitivity 04/16/2014  . Abdominal pain, unspecified site 02/12/2014  . IBS (irritable bowel syndrome) 09/05/2013  . Hair loss 04/03/2013  . Anxiety and  depression 09/27/2011  . Syncope 03/09/2011  . ANEMIA, IRON DEFICIENCY 09/14/2010  . CELIAC SPRUE 11/06/2009  . OTHER CONSTIPATION 10/06/2009  . ABDOMINAL PAIN, LEFT LOWER QUADRANT 08/18/2008  . Depression 10/22/2007  . GERD 10/22/2007  . CONSTIPATION 10/22/2007  . IBS 10/22/2007  . HEPATIC CYST 10/22/2007  . INTERSTITIAL CYSTITIS 10/22/2007   Past Medical History:  Diagnosis Date  . Adenomatous colon polyp   . Allergy    SEASONAL  . Anemia   . Anxiety   . Arthritis    BILATERAL HANDS,TOES ON RT. FOOT  . Bladder disease   . Chronic interstitial cystitis   . Depressive disorder, not elsewhere classified   . Esophageal reflux   . Fundic gland polyps of stomach, benign   . GERD (gastroesophageal reflux disease)   . Hemorrhoids   . Hepatic cyst   . Hyperlipidemia   . Irritable bowel syndrome   . Migraine   . Numbness and tingling   . Pulmonary nodules   . Rosacea   . Syncope   . Unspecified constipation   . Unsteady gait    Lips and chin    Family History  Problem Relation Age of Onset  . Heart attack Mother   . Bladder Cancer Mother   . Heart disease Mother   . Bladder Cancer Father   . Heart disease Father   . Pancreatic cancer Paternal Grandfather   . Diabetes Paternal Grandmother   . Stroke Paternal Grandmother   . Colon cancer Neg Hx     Past Surgical History:  Procedure Laterality Date  . COLONOSCOPY  10/08/2010   Normal--multiple   . CYSTECTOMY    . DILATION AND CURETTAGE OF UTERUS    . ESOPHAGEAL MANOMETRY N/A 10/22/2012   Procedure: ESOPHAGEAL MANOMETRY (EM);  Surgeon: Sable Feil, MD;  Location: [facility];  Service: Endoscopy;  Laterality: N/A;  . ESOPHAGOGASTRODUODENOSCOPY  10/08/2010  . LAPAROSCOPY    . milk gland removal    . OOPHORECTOMY    . VAGINAL HYSTERECTOMY     Social History   Occupational History  . 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 Smoking status: Former Smoker     Packs/day: 0.50    Years: 16.00    Pack years: 8.00    Types: Cigarettes    Quit date: 08/09/1987    Years since quitting: 32.3  . Smokeless tobacco: Never Used  Substance and Sexual Activity  . Alcohol use: Yes    Alcohol/week: 0.0 standard drinks    Comment: Occasionally  . Drug use: No  . Sexual activity: Not on file

## 2019-12-10 ENCOUNTER — Other Ambulatory Visit: Payer: Self-pay

## 2019-12-12 ENCOUNTER — Other Ambulatory Visit: Payer: Self-pay

## 2019-12-12 ENCOUNTER — Other Ambulatory Visit: Payer: Self-pay | Admitting: Physician Assistant

## 2019-12-12 ENCOUNTER — Telehealth: Payer: Self-pay | Admitting: Orthopedic Surgery

## 2019-12-12 ENCOUNTER — Encounter (HOSPITAL_BASED_OUTPATIENT_CLINIC_OR_DEPARTMENT_OTHER): Payer: Self-pay | Admitting: Orthopedic Surgery

## 2019-12-12 NOTE — Telephone Encounter (Signed)
Ms. Hajdu is scheduled for a right GT MTP fusion next Tuesday 12/17/19.  She would like to use a rolling walker/knee scooter post operatively.  She spoke with [facility] in [place] and they advised her that her insurance would cover it if Dr. Sharol Given would write an order for it.  She would like [place] to contact [facility] and discuss.  Their phone # is [phone].

## 2019-12-13 ENCOUNTER — Other Ambulatory Visit (HOSPITAL_COMMUNITY)
Admission: RE | Admit: 2019-12-13 | Discharge: 2019-12-13 | Disposition: A | Discharge status: Home / Self Care | Payer: Medicare PPO | Source: Ambulatory Visit | Attending: Orthopedic Surgery | Admitting: Orthopedic Surgery

## 2019-12-13 DIAGNOSIS — Z20822 Contact with and (suspected) exposure to covid-19: Secondary | ICD-10-CM | POA: Insufficient documentation

## 2019-12-13 DIAGNOSIS — Z01812 Encounter for preprocedural laboratory examination: Secondary | ICD-10-CM | POA: Diagnosis present

## 2019-12-13 NOTE — Telephone Encounter (Signed)
I called the number provided by pt and sw a representative "Shirlee Limerick" and she advised that they do not file insurance for the knee scooter and that the out of pocket on this would be 150$ a month or 215$ full price. I called the pt to advise and she was very upset stating that she had been advised by [facility] that this would be covered and I advised the pt that she may need to submit  for reimbursement if the DME company would not file insurance. She voiced understanding and will call with any questions.

## 2019-12-14 LAB — SARS CORONAVIRUS 2 (TAT 6-24 HRS): SARS Coronavirus 2: NEGATIVE

## 2019-12-17 ENCOUNTER — Encounter (HOSPITAL_BASED_OUTPATIENT_CLINIC_OR_DEPARTMENT_OTHER)
Admission: RE | Disposition: A | Discharge status: Home / Self Care | Payer: Self-pay | Source: Home / Self Care | Attending: Orthopedic Surgery

## 2019-12-17 ENCOUNTER — Encounter (HOSPITAL_BASED_OUTPATIENT_CLINIC_OR_DEPARTMENT_OTHER): Payer: Self-pay | Admitting: Orthopedic Surgery

## 2019-12-17 ENCOUNTER — Ambulatory Visit (HOSPITAL_BASED_OUTPATIENT_CLINIC_OR_DEPARTMENT_OTHER): Payer: Medicare PPO | Admitting: Certified Registered Nurse Anesthetist

## 2019-12-17 ENCOUNTER — Ambulatory Visit (HOSPITAL_BASED_OUTPATIENT_CLINIC_OR_DEPARTMENT_OTHER)
Admission: RE | Admit: 2019-12-17 | Discharge: 2019-12-17 | Disposition: A | Discharge status: Home / Self Care | Payer: Medicare PPO | Attending: Orthopedic Surgery | Admitting: Orthopedic Surgery

## 2019-12-17 ENCOUNTER — Other Ambulatory Visit: Payer: Self-pay

## 2019-12-17 DIAGNOSIS — Z87891 Personal history of nicotine dependence: Secondary | ICD-10-CM | POA: Insufficient documentation

## 2019-12-17 DIAGNOSIS — M2021 Hallux rigidus, right foot: Secondary | ICD-10-CM | POA: Insufficient documentation

## 2019-12-17 HISTORY — PX: ARTHRODESIS METATARSALPHALANGEAL JOINT (MTPJ): SHX6566

## 2019-12-17 SURGERY — FUSION, JOINT, GREAT TOE
Anesthesia: Monitor Anesthesia Care | Site: Toe | Laterality: Right

## 2019-12-17 MED ORDER — CEFAZOLIN SODIUM-DEXTROSE 2-4 GM/100ML-% IV SOLN
INTRAVENOUS | Status: AC
  Filled 2019-12-17: qty 100

## 2019-12-17 MED ORDER — OXYCODONE-ACETAMINOPHEN 5-325 MG PO TABS
1.0000 | ORAL_TABLET | ORAL | 0 refills | Status: DC | PRN
Start: 2019-12-17 — End: 2019-12-19

## 2019-12-17 MED ORDER — FENTANYL CITRATE (PF) 100 MCG/2ML IJ SOLN
INTRAMUSCULAR | Status: AC
  Filled 2019-12-17: qty 2

## 2019-12-17 MED ORDER — PROPOFOL 10 MG/ML IV BOLUS
INTRAVENOUS | Status: DC | PRN
  Administered 2019-12-17: 30 mg via INTRAVENOUS

## 2019-12-17 MED ORDER — ROPIVACAINE HCL 5 MG/ML IJ SOLN
INTRAMUSCULAR | Status: DC | PRN
  Administered 2019-12-17: 15 mL via PERINEURAL
  Administered 2019-12-17: 25 mL via PERINEURAL

## 2019-12-17 MED ORDER — MIDAZOLAM HCL 2 MG/2ML IJ SOLN
1.0000 mg | INTRAMUSCULAR | Status: DC | PRN
  Administered 2019-12-17: 2 mg via INTRAVENOUS

## 2019-12-17 MED ORDER — ONDANSETRON HCL 4 MG/2ML IJ SOLN: 4.0000 mg | Freq: Once | INTRAMUSCULAR | Status: DC | PRN

## 2019-12-17 MED ORDER — CEFAZOLIN SODIUM-DEXTROSE 2-4 GM/100ML-% IV SOLN
2.0000 g | INTRAVENOUS | Status: AC
  Administered 2019-12-17: 2 g via INTRAVENOUS

## 2019-12-17 MED ORDER — ONDANSETRON HCL 4 MG/2ML IJ SOLN
INTRAMUSCULAR | Status: DC | PRN
  Administered 2019-12-17: 4 mg via INTRAVENOUS

## 2019-12-17 MED ORDER — OXYCODONE HCL 5 MG PO TABS: 5.0000 mg | ORAL_TABLET | Freq: Once | ORAL | Status: DC | PRN

## 2019-12-17 MED ORDER — EPHEDRINE 5 MG/ML INJ
INTRAVENOUS | Status: AC
  Filled 2019-12-17: qty 10

## 2019-12-17 MED ORDER — MIDAZOLAM HCL 2 MG/2ML IJ SOLN
INTRAMUSCULAR | Status: AC
  Filled 2019-12-17: qty 2

## 2019-12-17 MED ORDER — ONDANSETRON HCL 4 MG/2ML IJ SOLN
INTRAMUSCULAR | Status: AC
  Filled 2019-12-17: qty 2

## 2019-12-17 MED ORDER — FENTANYL CITRATE (PF) 100 MCG/2ML IJ SOLN
50.0000 ug | INTRAMUSCULAR | Status: DC | PRN
  Administered 2019-12-17: 50 ug via INTRAVENOUS

## 2019-12-17 MED ORDER — FENTANYL CITRATE (PF) 100 MCG/2ML IJ SOLN: 25.0000 ug | INTRAMUSCULAR | Status: DC | PRN

## 2019-12-17 MED ORDER — LACTATED RINGERS IV SOLN: INTRAVENOUS | Status: DC

## 2019-12-17 MED ORDER — PROPOFOL 500 MG/50ML IV EMUL
INTRAVENOUS | Status: DC | PRN
  Administered 2019-12-17: 75 ug/kg/min via INTRAVENOUS

## 2019-12-17 MED ORDER — LIDOCAINE 2% (20 MG/ML) 5 ML SYRINGE
INTRAMUSCULAR | Status: AC
  Filled 2019-12-17: qty 5

## 2019-12-17 MED ORDER — PROPOFOL 500 MG/50ML IV EMUL
INTRAVENOUS | Status: AC
  Filled 2019-12-17: qty 50

## 2019-12-17 MED ORDER — LIDOCAINE 2% (20 MG/ML) 5 ML SYRINGE
INTRAMUSCULAR | Status: DC | PRN
  Administered 2019-12-17: 40 mg via INTRAVENOUS

## 2019-12-17 MED ORDER — OXYCODONE HCL 5 MG/5ML PO SOLN: 5.0000 mg | Freq: Once | ORAL | Status: DC | PRN

## 2019-12-17 SURGICAL SUPPLY — 49 items
BIT DRILL QC 2X125 (BIT) ×1 IMPLANT
BLADE OSC/SAG .038X5.5 CUT EDG (BLADE) IMPLANT
BLADE SURG 15 STRL LF DISP TIS (BLADE) ×2 IMPLANT
BLADE SURG 15 STRL SS (BLADE) ×4
BNDG COHESIVE 4X5 TAN STRL (GAUZE/BANDAGES/DRESSINGS) ×2 IMPLANT
BNDG ESMARK 4X9 LF (GAUZE/BANDAGES/DRESSINGS) ×2 IMPLANT
BNDG GAUZE ELAST 4 BULKY (GAUZE/BANDAGES/DRESSINGS) ×2 IMPLANT
COVER BACK TABLE 60X90IN (DRAPES) ×2 IMPLANT
COVER WAND RF STERILE (DRAPES) IMPLANT
DECANTER SPIKE VIAL GLASS SM (MISCELLANEOUS) IMPLANT
DRAPE EXTREMITY T 121X128X90 (DISPOSABLE) ×2 IMPLANT
DRAPE OEC MINIVIEW 54X84 (DRAPES) ×2 IMPLANT
DRAPE U-SHAPE 47X51 STRL (DRAPES) IMPLANT
DRILL BIT QC 2X125 (BIT) ×1
DRSG EMULSION OIL 3X3 NADH (GAUZE/BANDAGES/DRESSINGS) ×2 IMPLANT
DURAPREP 26ML APPLICATOR (WOUND CARE) ×2 IMPLANT
ELECT REM PT RETURN 9FT ADLT (ELECTROSURGICAL) ×2
ELECTRODE REM PT RTRN 9FT ADLT (ELECTROSURGICAL) ×1 IMPLANT
GAUZE 4X4 16PLY RFD (DISPOSABLE) IMPLANT
GAUZE SPONGE 4X4 12PLY STRL (GAUZE/BANDAGES/DRESSINGS) ×2 IMPLANT
GLOVE BIOGEL PI IND STRL 7.0 (GLOVE) ×1 IMPLANT
GLOVE BIOGEL PI IND STRL 9 (GLOVE) ×1 IMPLANT
GLOVE BIOGEL PI INDICATOR 7.0 (GLOVE) ×1
GLOVE BIOGEL PI INDICATOR 9 (GLOVE) ×1
GLOVE SURG ORTHO 9.0 STRL STRW (GLOVE) ×2 IMPLANT
GLOVE SURG SS PI 7.0 STRL IVOR (GLOVE) ×2 IMPLANT
GOWN STRL REUS W/ TWL LRG LVL3 (GOWN DISPOSABLE) ×1 IMPLANT
GOWN STRL REUS W/TWL LRG LVL3 (GOWN DISPOSABLE) ×2
GOWN STRL REUS W/TWL XL LVL3 (GOWN DISPOSABLE) ×2 IMPLANT
NEEDLE HYPO 25X1 1.5 SAFETY (NEEDLE) IMPLANT
NS IRRIG 1000ML POUR BTL (IV SOLUTION) ×2 IMPLANT
PAD CAST 4YDX4 CTTN HI CHSV (CAST SUPPLIES) ×1 IMPLANT
PADDING CAST COTTON 4X4 STRL (CAST SUPPLIES) ×2
PENCIL SMOKE EVACUATOR (MISCELLANEOUS) ×2 IMPLANT
PLATE COMP LOCK SM 42 (Plate) ×2 IMPLANT
SCREW CORTEX 2.7 SLF-TPNG 16MM (Screw) ×2 IMPLANT
SCREW LOCK VA ST 2.7X14 (Screw) ×6 IMPLANT
SCREW LOCKING 2.7X16MM VA (Screw) ×6 IMPLANT
SET BASIN DAY SURGERY F.S. (CUSTOM PROCEDURE TRAY) ×2 IMPLANT
SPONGE LAP 18X18 RF (DISPOSABLE) ×2 IMPLANT
STOCKINETTE 6  STRL (DRAPES) ×2
STOCKINETTE 6 STRL (DRAPES) ×1 IMPLANT
SUT ETHILON 2 0 FSLX (SUTURE) ×2 IMPLANT
SUT ETHILON 3 0 FSL (SUTURE) ×2 IMPLANT
SUT VIC AB 2-0 CT1 27 (SUTURE) ×2
SUT VIC AB 2-0 CT1 TAPERPNT 27 (SUTURE) ×1 IMPLANT
SYR BULB EAR ULCER 3OZ GRN STR (SYRINGE) ×2 IMPLANT
SYR CONTROL 10ML LL (SYRINGE) IMPLANT
TOWEL GREEN STERILE FF (TOWEL DISPOSABLE) ×2 IMPLANT

## 2019-12-17 NOTE — Op Note (Signed)
12/17/2019  10:31 AM  PATIENT:  [name]    PRE-OPERATIVE DIAGNOSIS:  Hallux Rigidus Right Foot  POST-OPERATIVE DIAGNOSIS:  Same  PROCEDURE:  ARTHRODESIS METATARSALPHALANGEAL JOINT (MTPJ) RIGHT GREAT TOE C-arm fluoroscopy to verify alignment and fusion.  SURGEON:  Newt Minion, MD  PHYSICIAN ASSISTANT:None ANESTHESIA:   General  PREOPERATIVE INDICATIONS:  [name] is a  64 y.o. female with a diagnosis of Hallux Rigidus Right Foot who failed conservative measures and elected for surgical management.    The risks benefits and alternatives were discussed with the patient preoperatively including but not limited to the risks of infection, bleeding, nerve injury, cardiopulmonary complications, the need for revision surgery, among others, and the patient was willing to proceed.  OPERATIVE IMPLANTS: Synthes MTP fusion plate 10 degrees locked proximally and distally x3  @ENCIMAGES @  OPERATIVE FINDINGS: C-arm fluoroscopy postoperatively showed stable alignment in both AP and lateral planes.  OPERATIVE PROCEDURE: Patient was brought the operating room after undergoing a regional block.  The right lower extremity was then prepped using DuraPrep draped into a sterile field a timeout was called.  A medial longitudinal incision was made this was carried down to the MTP joint.  The capsule was elevated dorsally and plantarly.  Osteophytic bone spurs were removed with a rondure and a oscillating saw.  A guidewire was inserted down the shaft of the first metatarsal.  The cup reamer 20 mm was then used to ream to bleeding subchondral bone.  Osteophytic bone spurs were again removed with the saw and the rondure.  A guidewire was then inserted down the shaft of the proximal phalanx the cone reamer was then used 20 mm to debride down to bleeding viable subchondral bone.  The wound was irrigated with normal saline the joint was reduced a space was left in the first webspace and the plate was  applied with a compression screw for initial alignment compression screws were then placed distally x3 proximally x2 the compression screw was removed and this was substituted for a locking screw.  The wound was irrigated normal saline the retinaculum was closed using 2-0 Vicryl skin was closed using 2-0 nylon a sterile dressing was applied patient was taken the PACU in stable condition.   DISCHARGE PLANNING:  Antibiotic duration: Preoperative antibiotics  Weightbearing: Ideally nonweightbearing on the right.  Pain medication: Prescription for Percocet  Dressing care/ Wound VAC: Follow-up in the office 1 week to change the dressing  Ambulatory devices: Crutches, walker, kneeling scooter  Discharge to: Home.  Follow-up: In the office 1 week post operative.

## 2019-12-17 NOTE — Anesthesia Procedure Notes (Signed)
Anesthesia Regional Block: Adductor canal block   Pre-Anesthetic Checklist: ,, timeout performed, Correct Patient, Correct Site, Correct Laterality, Correct Procedure, Correct Position, site marked, Risks and benefits discussed,  Surgical consent,  Pre-op evaluation,  At surgeon's request and post-op pain management  Laterality: Right  Prep: chloraprep       Needles:  Injection technique: Single-shot  Needle Type: Echogenic Stimulator Needle     Needle Length: 9cm  Needle Gauge: 21     Additional Needles:   Procedures:,,,, ultrasound used (permanent image in chart),,,,  Narrative:  Start time: 12/17/2019 9:03 AM End time: 12/17/2019 9:06 AM Injection made incrementally with aspirations every 5 mL.  Performed by: Personally  Anesthesiologist: Lidia Collum, MD  Additional Notes: Monitors applied. Injection made in 5cc increments. No resistance to injection. Good needle visualization. Patient tolerated procedure well.

## 2019-12-17 NOTE — Transfer of Care (Signed)
Immediate Anesthesia Transfer of Care Note  Patient: [name]  Procedure(s) Performed: ARTHRODESIS METATARSALPHALANGEAL JOINT (MTPJ) RIGHT GREAT TOE (Right Toe)  Patient Location: PACU  Anesthesia Type:MAC combined with regional for post-op pain  Level of Consciousness: awake, alert  and oriented  Airway & Oxygen Therapy: Patient Spontanous Breathing and Patient connected to face mask oxygen  Post-op Assessment: Report given to RN and Post -op Vital signs reviewed and stable  Post vital signs: Reviewed and stable  Last Vitals:  Vitals Value Taken Time  BP 141/77 12/17/19 1016  Temp    Pulse 60 12/17/19 1024  Resp 19 12/17/19 1024  SpO2 100 % 12/17/19 1024  Vitals shown include unvalidated device data.  Last Pain:  Vitals:   12/17/19 0848  TempSrc: Temporal  PainSc: 8       Patients Stated Pain Goal: 4 (42/87/68 1157)  Complications: No apparent anesthesia complications

## 2019-12-17 NOTE — Anesthesia Postprocedure Evaluation (Signed)
Anesthesia Post Note  Patient: [name]  Procedure(s) Performed: ARTHRODESIS METATARSALPHALANGEAL JOINT (MTPJ) RIGHT GREAT TOE (Right Toe)     Patient location during evaluation: PACU Anesthesia Type: Regional Level of consciousness: awake and alert Pain management: pain level controlled Vital Signs Assessment: post-procedure vital signs reviewed and stable Respiratory status: spontaneous breathing, nonlabored ventilation and respiratory function stable Cardiovascular status: blood pressure returned to baseline and stable Postop Assessment: no apparent nausea or vomiting Anesthetic complications: no    Last Vitals:  Vitals:   12/17/19 1030 12/17/19 1130  BP: 122/83 (!) 155/91  Pulse: (!) 57 65  Resp: 18 16  Temp:  36.6 C  SpO2: 100% 100%    Last Pain:  Vitals:   12/17/19 1130  TempSrc:   PainSc: 0-No pain                 Lidia Collum

## 2019-12-17 NOTE — Anesthesia Procedure Notes (Signed)
Anesthesia Regional Block: Popliteal block   Pre-Anesthetic Checklist: ,, timeout performed, Correct Patient, Correct Site, Correct Laterality, Correct Procedure, Correct Position, site marked, Risks and benefits discussed,  Surgical consent,  Pre-op evaluation,  At surgeon's request and post-op pain management  Laterality: Left  Prep: chloraprep       Needles:  Injection technique: Single-shot  Needle Type: Echogenic Stimulator Needle     Needle Length: 9cm  Needle Gauge: 21     Additional Needles:   Procedures:,,,, ultrasound used (permanent image in chart),,,,  Narrative:  Start time: 12/17/2019 9:06 AM End time: 12/17/2019 9:09 AM Injection made incrementally with aspirations every 5 mL.  Performed by: Personally  Anesthesiologist: Lidia Collum, MD  Additional Notes: Monitors applied. Injection made in 5cc increments. No resistance to injection. Good needle visualization. Patient tolerated procedure well.

## 2019-12-17 NOTE — Discharge Instructions (Signed)

## 2019-12-17 NOTE — Anesthesia Preprocedure Evaluation (Signed)
Anesthesia Evaluation  Patient identified by MRN, date of birth, ID band Patient awake    Reviewed: Allergy & Precautions, NPO status , Patient's Chart, lab work & pertinent test results  History of Anesthesia Complications Negative for: history of anesthetic complications  Airway Mallampati: II  TM Distance: >3 FB Neck ROM: Full    Dental  (+) Teeth Intact   Pulmonary neg pulmonary ROS, former smoker,    Pulmonary exam normal        Cardiovascular negative cardio ROS Normal cardiovascular exam     Neuro/Psych  Headaches, PSYCHIATRIC DISORDERS Anxiety Depression    GI/Hepatic Neg liver ROS, GERD  ,  Endo/Other  negative endocrine ROS  Renal/GU negative Renal ROS  negative genitourinary   Musculoskeletal negative musculoskeletal ROS (+)   Abdominal   Peds  Hematology negative hematology ROS (+)   Anesthesia Other Findings  HLD  Reproductive/Obstetrics                           Anesthesia Physical Anesthesia Plan  ASA: II  Anesthesia Plan: MAC and Regional   Post-op Pain Management:  Regional for Post-op pain   Induction: Intravenous  PONV Risk Score and Plan: 2 and Propofol infusion, TIVA and Treatment may vary due to age or medical condition  Airway Management Planned: Natural Airway, Nasal Cannula and Simple Face Mask  Additional Equipment: None  Intra-op Plan:   Post-operative Plan:   Informed Consent: I have reviewed the patients History and Physical, chart, labs and discussed the procedure including the risks, benefits and alternatives for the proposed anesthesia with the patient or authorized representative who has indicated his/her understanding and acceptance.       Plan Discussed with:   Anesthesia Plan Comments:         Anesthesia Quick Evaluation

## 2019-12-17 NOTE — H&P (Signed)
[name] is an 64 y.o. female.   Chief Complaint: Right great toe pain HPI:  Patient is a 64 year old woman who presents complaining of 5-year history of pain right great toe MTP joint.  She has tried shoewear modifications without relief she states she now only can wear open toed shoes secondary to pain.  Patient states she has not smoked in 30 years. Past Medical History:  Diagnosis Date  . Adenomatous colon polyp   . Allergy    SEASONAL  . Anemia   . Anxiety   . Arthritis    BILATERAL HANDS,TOES ON RT. FOOT  . Bladder disease   . Chronic interstitial cystitis   . Depressive disorder, not elsewhere classified   . Esophageal reflux   . Fundic gland polyps of stomach, benign   . GERD (gastroesophageal reflux disease)   . Hemorrhoids   . Hepatic cyst   . Hyperlipidemia   . Irritable bowel syndrome   . Migraine   . Numbness and tingling   . Pulmonary nodules   . Rosacea   . Syncope   . Unspecified constipation   . Unsteady gait    Lips and chin    Past Surgical History:  Procedure Laterality Date  . COLONOSCOPY  10/08/2010   Normal--multiple   . CYSTECTOMY    . DILATION AND CURETTAGE OF UTERUS    . ESOPHAGEAL MANOMETRY N/A 10/22/2012   Procedure: ESOPHAGEAL MANOMETRY (EM);  Surgeon: Sable Feil, MD;  Location: [facility];  Service: Endoscopy;  Laterality: N/A;  . ESOPHAGOGASTRODUODENOSCOPY  10/08/2010  . LAPAROSCOPY    . milk gland removal    . OOPHORECTOMY    . VAGINAL HYSTERECTOMY      Family History  Problem Relation Age of Onset  . Heart attack Mother   . Bladder Cancer Mother   . Heart disease Mother   . Bladder Cancer Father   . Heart disease Father   . Pancreatic cancer Paternal Grandfather   . Diabetes Paternal Grandmother   . Stroke Paternal Grandmother   . Colon cancer Neg Hx    Social History:  reports that she quit smoking about 32 years ago. Her smoking use included cigarettes. She has a 8.00 pack-year smoking history. She has never used  smokeless tobacco. She reports current alcohol use. She reports that she does not use drugs.  Allergies:  Allergies  Allergen Reactions  . Prednisone Other (See Comments)    Extreme fatigue  . Aspirin Nausea Only  . Topamax [Topiramate] Rash    And hives     No medications prior to admission.    No results found for this or any previous visit (from the past 48 hour(s)). No results found.  Review of Systems  All other systems reviewed and are negative.   Height 5\' 6"  (1.676 m), weight 73.5 kg. Physical Exam   Patient is alert, oriented, no adenopathy, well-dressed, normal affect, normal respiratory effort. Examination patient has a good dorsalis pedis pulse there is no redness no cellulitis she does have prominent bony spurs and swelling around the MTP joint.  She has essentially no range of motion of the toe and pain with attempted range of motion.  Axial compression is also painful.  Patient is developing some numbness distal to the MTP joint secondary to nerve impingement.Lungs Clear Heart RRR Assessment/Plan  1. Pain in right foot     Plan: Discussed operative versus nonoperative interventions.  With the complete joint space collapse and large osteophytic bone  spurs discussed that her best option would be to proceed with MTP fusion.  Risks and benefits of surgery were discussed postoperative care was discussed patient states she would like to proceed with surgery at this time.  We will plan for outpatient surgery at [facility] day hospital.  Bevely Palmer Tess Potts, PA 12/17/2019, 6:35 AM

## 2019-12-17 NOTE — Progress Notes (Signed)
Assisted Dr. Christella Hartigan with right, ultrasound guided, popliteal/saphenous block. Side rails up, monitors on throughout procedure. See vital signs in flow sheet. Tolerated Procedure well.

## 2019-12-18 ENCOUNTER — Telehealth: Payer: Self-pay | Admitting: Orthopedic Surgery

## 2019-12-18 NOTE — Telephone Encounter (Signed)
I called pt to advise that we can write the order for her and she can pick this up on the day of her post op appt and then she can mail it or fax it to her insurance company. Pt voiced understanding and will call with questions.

## 2019-12-18 NOTE — Telephone Encounter (Signed)
Patient called.   She needs the order for a knee scooter sent to her insurance company. I asked is she could provide a fax but she was unable to.   [employer] Provider Number: [phone]  Call back: [phone]

## 2019-12-19 ENCOUNTER — Telehealth: Payer: Self-pay | Admitting: Orthopedic Surgery

## 2019-12-19 ENCOUNTER — Other Ambulatory Visit: Payer: Self-pay | Admitting: Orthopedic Surgery

## 2019-12-19 MED ORDER — OXYCODONE-ACETAMINOPHEN 5-325 MG PO TABS: 1.0000 | ORAL_TABLET | Freq: Three times a day (TID) | ORAL | 0 refills | Status: DC | PRN

## 2019-12-19 NOTE — Telephone Encounter (Signed)
Pt is s/p MTP fusion 12/17/19 requesting refill on Oxycodone 5/325

## 2019-12-19 NOTE — Telephone Encounter (Signed)
Rx written.

## 2019-12-19 NOTE — Telephone Encounter (Signed)
Patient called. She says she will be out of oxycodone on 12/20/2019. She would like a refill for the weekend. Her call back number is [phone]

## 2019-12-20 ENCOUNTER — Ambulatory Visit: Payer: Medicare PPO | Admitting: Podiatry

## 2019-12-24 ENCOUNTER — Encounter: Payer: Self-pay | Admitting: Physician Assistant

## 2019-12-24 ENCOUNTER — Other Ambulatory Visit: Payer: Self-pay

## 2019-12-24 ENCOUNTER — Ambulatory Visit (INDEPENDENT_AMBULATORY_CARE_PROVIDER_SITE_OTHER): Payer: Medicare PPO | Admitting: Physician Assistant

## 2019-12-24 VITALS — Ht 66.0 in | Wt 163.0 lb

## 2019-12-24 DIAGNOSIS — M2021 Hallux rigidus, right foot: Secondary | ICD-10-CM

## 2019-12-24 MED ORDER — OXYCODONE-ACETAMINOPHEN 5-325 MG PO TABS: 1.0000 | ORAL_TABLET | Freq: Three times a day (TID) | ORAL | 0 refills | Status: DC | PRN

## 2019-12-24 NOTE — Addendum Note (Signed)
Addended by: Dondra Prader R on: 12/24/2019 10:56 AM   Modules accepted: Orders

## 2019-12-24 NOTE — Progress Notes (Signed)
Post-Op Visit Note   Patient: [name]           Date of Birth: 22-Aug-1955           MRN: [id number] Visit Date: 12/24/2019 PCP: [name], MD  Chief Complaint:  Chief Complaint  Patient presents with  . Right Foot - Routine Post Op    12/17/19 right foot GT MTP fusion     HPI:  HPI The patient is a 64 year old woman seen today 1 week status post right great toe metatarsophalangeal joint fusion.  She has been nonweightbearing on a kneeling scooter.  Sutures remain in place.  She complains of some throbbing pain but unfortunately she has bumped her foot on things in the home since surgery. Ortho Exam Incision is well approximated sutures there is no gaping no drainage there is very mild edema of the forefoot.  There is no sign of infection  Visit Diagnoses:  1. Hallux rigidus, right foot     Plan: She will begin daily Dial soap cleansing.  Dry dressing changes.  Continue nonweightbearing.  Given a postop shoe today.  She will continue her kneeling scooter, given an order for her kneeling scooter today  Follow-Up Instructions: Return in about 1 week (around 12/31/2019).   Imaging: No results found.  Orders:  No orders of the defined types were placed in this encounter.  No orders of the defined types were placed in this encounter.    PMFS History: Patient Active Problem List   Diagnosis Date Noted  . Hallux rigidus, right foot   . Chronic venous insufficiency 12/06/2017  . Raynauds syndrome 12/06/2017  . Hyperlipidemia 08/06/2017  . 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 Facet arthropathy 02/22/2016  . Migraine 09/28/2015  . Clubbed fingers 03/24/2015  . Multiple pulmonary nodules 03/23/2015  . Iron deficiency anemia 09/24/2014  . Non-celiac gluten sensitivity 04/16/2014  . Abdominal pain, unspecified site 02/12/2014  . IBS (irritable bowel syndrome) 09/05/2013  . Hair loss 04/03/2013  . Anxiety and depression 09/27/2011  . Syncope 03/09/2011  .  ANEMIA, IRON DEFICIENCY 09/14/2010  . CELIAC SPRUE 11/06/2009  . OTHER CONSTIPATION 10/06/2009  . ABDOMINAL PAIN, LEFT LOWER QUADRANT 08/18/2008  . Depression 10/22/2007  . GERD 10/22/2007  . CONSTIPATION 10/22/2007  . IBS 10/22/2007  . HEPATIC CYST 10/22/2007  . INTERSTITIAL CYSTITIS 10/22/2007   Past Medical History:  Diagnosis Date  . Adenomatous colon polyp   . Allergy    SEASONAL  . Anemia   . Anxiety   . Arthritis    BILATERAL HANDS,TOES ON RT. FOOT  . Bladder disease   . Chronic interstitial cystitis   . Depressive disorder, not elsewhere classified   . Esophageal reflux   . Fundic gland polyps of stomach, benign   . GERD (gastroesophageal reflux disease)   . Hemorrhoids   . Hepatic cyst   . Hyperlipidemia   . Irritable bowel syndrome   . Migraine   . Numbness and tingling   . Pulmonary nodules   . Rosacea   . Syncope   . Unspecified constipation   . Unsteady gait    Lips and chin    Family History  Problem Relation Age of Onset  . Heart attack Mother   . Bladder Cancer Mother   . Heart disease Mother   . Bladder Cancer Father   . Heart disease Father   . Pancreatic cancer Paternal Grandfather   . Diabetes Paternal Grandmother   . Stroke Paternal Grandmother   .  Colon cancer Neg Hx     Past Surgical History:  Procedure Laterality Date  . COLONOSCOPY  10/08/2010   Normal--multiple   . CYSTECTOMY    . DILATION AND CURETTAGE OF UTERUS    . ESOPHAGEAL MANOMETRY N/A 10/22/2012   Procedure: ESOPHAGEAL MANOMETRY (EM);  Surgeon: Sable Feil, MD;  Location: [facility];  Service: Endoscopy;  Laterality: N/A;  . ESOPHAGOGASTRODUODENOSCOPY  10/08/2010  . LAPAROSCOPY    . milk gland removal    . OOPHORECTOMY    . VAGINAL HYSTERECTOMY     Social History   Occupational History  . 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 Smoking status: Former Smoker    Packs/day: 0.50    Years: 16.00    Pack years:  8.00    Types: Cigarettes    Quit date: 08/09/1987    Years since quitting: 32.3  . Smokeless tobacco: Never Used  Substance and Sexual Activity  . Alcohol use: Yes    Alcohol/week: 0.0 standard drinks    Comment: Occasionally  . Drug use: No  . Sexual activity: Not on file

## 2019-12-25 ENCOUNTER — Encounter: Payer: Self-pay | Admitting: *Deleted

## 2019-12-31 ENCOUNTER — Ambulatory Visit (INDEPENDENT_AMBULATORY_CARE_PROVIDER_SITE_OTHER): Payer: Medicare PPO | Admitting: Physician Assistant

## 2019-12-31 ENCOUNTER — Other Ambulatory Visit: Payer: Self-pay

## 2019-12-31 ENCOUNTER — Encounter: Payer: Self-pay | Admitting: Physician Assistant

## 2019-12-31 ENCOUNTER — Ambulatory Visit (INDEPENDENT_AMBULATORY_CARE_PROVIDER_SITE_OTHER): Payer: Medicare PPO

## 2019-12-31 VITALS — Ht 66.0 in | Wt 163.0 lb

## 2019-12-31 DIAGNOSIS — M2021 Hallux rigidus, right foot: Secondary | ICD-10-CM

## 2019-12-31 NOTE — Progress Notes (Signed)
Office Visit Note   Patient: [name]           Date of Birth: 1956/02/13           MRN: [id number] Visit Date: 12/31/2019              Requested by: [name], MD [address] [place] [address] PCP: [name], MD  Chief Complaint  Patient presents with  . Right Foot - Routine Post Op    12/17/19 right GT MTP joint fusion       HPI: The patient is 2 weeks status post right first MTP arthrodesis she is doing well.  She does get some nerve pain at night.  She takes just an oxycodone at night to help her sleep.  She denies any other pain such as calf pain.  She also notices that her foot gets cold occasionally she does have a history of Raynaud's Assessment & Plan: Visit Diagnoses:  1. Hallux rigidus, right foot     Plan: Sutures were harvested today.  She will continue to be just heel weightbearing for the next 2 weeks.  Follow-up at that time.  X-rays of her foot should be taken.  Follow-Up Instructions: No follow-ups on file.   Ortho Exam  Patient is alert, oriented, no adenopathy, well-dressed, normal affect, normal respiratory effort. Right foot demonstrates well-healed surgical incision swelling is well controlled pulses are intact.  She has brisk capillary refill in her toes.  No erythema no cellulitis  Imaging: No results found. No images are attached to the encounter.  Labs: Lab Results  Component Value Date   ESRSEDRATE 34 (H) 09/27/2011   ESRSEDRATE 34 (H) 10/06/2009   ESRSEDRATE 28 (H) 09/08/2009     Lab Results  Component Value Date   ALBUMIN 4.4 10/16/2017   ALBUMIN 4.0 04/22/2015   ALBUMIN 4.0 12/24/2014    Lab Results  Component Value Date   MG 2.0 10/04/2012   MG 2.0 09/14/2010   MG 2.3 10/06/2009   Lab Results  Component Value Date   VD25OH 30.31 04/16/2014   VD25OH 21 (L) 10/04/2012   VD25OH 35 09/27/2011    No results found for: PREALBUMIN CBC EXTENDED Latest Ref Rng & Units 04/19/2016 10/20/2015 04/22/2015  WBC  3.9 - 10.3 10e3/uL 6.9 6.8 7.1  RBC 3.70 - 5.45 10e6/uL 4.96 5.06 4.73  HGB 11.6 - 15.9 g/dL 15.1 15.3 14.7  HCT 34.8 - 46.6 % 45.2 46.5 43.8  PLT 145 - 400 10e3/uL 216 216 202  NEUTROABS 1.5 - 6.5 10e3/uL 4.4 3.4 4.0  LYMPHSABS 0.9 - 3.3 10e3/uL 1.8 2.6 2.3     Body mass index is 26.31 kg/m.  Orders:  Orders Placed This Encounter  Procedures  . XR Foot 2 Views Right   No orders of the defined types were placed in this encounter.    Procedures: No procedures performed  Clinical Data: No additional findings.  ROS:  All other systems negative, except as noted in the HPI. Review of Systems  Objective: Vital Signs: Ht 5\' 6"  (1.676 m)   Wt 163 lb (73.9 kg)   BMI 26.31 kg/m   Specialty Comments:  No specialty comments available.  PMFS History: Patient Active Problem List   Diagnosis Date Noted  . Hallux rigidus, right foot   . Chronic venous insufficiency 12/06/2017  . Raynauds syndrome 12/06/2017  . Hyperlipidemia 08/06/2017  . 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 Facet arthropathy 02/22/2016  . Migraine  09/28/2015  . Clubbed fingers 03/24/2015  . Multiple pulmonary nodules 03/23/2015  . Iron deficiency anemia 09/24/2014  . Non-celiac gluten sensitivity 04/16/2014  . Abdominal pain, unspecified site 02/12/2014  . IBS (irritable bowel syndrome) 09/05/2013  . Hair loss 04/03/2013  . Anxiety and depression 09/27/2011  . Syncope 03/09/2011  . ANEMIA, IRON DEFICIENCY 09/14/2010  . CELIAC SPRUE 11/06/2009  . OTHER CONSTIPATION 10/06/2009  . ABDOMINAL PAIN, LEFT LOWER QUADRANT 08/18/2008  . Depression 10/22/2007  . GERD 10/22/2007  . CONSTIPATION 10/22/2007  . IBS 10/22/2007  . HEPATIC CYST 10/22/2007  . INTERSTITIAL CYSTITIS 10/22/2007   Past Medical History:  Diagnosis Date  . Adenomatous colon polyp   . Allergy    SEASONAL  . Anemia   . Anxiety   . Arthritis    BILATERAL HANDS,TOES ON RT. FOOT  . Bladder disease   . Chronic interstitial  cystitis   . Depressive disorder, not elsewhere classified   . Esophageal reflux   . Fundic gland polyps of stomach, benign   . GERD (gastroesophageal reflux disease)   . Hemorrhoids   . Hepatic cyst   . Hyperlipidemia   . Irritable bowel syndrome   . Migraine   . Numbness and tingling   . Pulmonary nodules   . Rosacea   . Syncope   . Unspecified constipation   . Unsteady gait    Lips and chin    Family History  Problem Relation Age of Onset  . Heart attack Mother   . Bladder Cancer Mother   . Heart disease Mother   . Bladder Cancer Father   . Heart disease Father   . Pancreatic cancer Paternal Grandfather   . Diabetes Paternal Grandmother   . Stroke Paternal Grandmother   . Colon cancer Neg Hx     Past Surgical History:  Procedure Laterality Date  . ARTHRODESIS METATARSALPHALANGEAL JOINT (MTPJ) Right 12/17/2019   Procedure: ARTHRODESIS METATARSALPHALANGEAL JOINT (MTPJ) RIGHT GREAT TOE;  Surgeon: Newt Minion, MD;  Location: [facility];  Service: Orthopedics;  Laterality: Right;  block in preop  . COLONOSCOPY  10/08/2010   Normal--multiple   . CYSTECTOMY    . DILATION AND CURETTAGE OF UTERUS    . ESOPHAGEAL MANOMETRY N/A 10/22/2012   Procedure: ESOPHAGEAL MANOMETRY (EM);  Surgeon: Sable Feil, MD;  Location: [facility];  Service: Endoscopy;  Laterality: N/A;  . ESOPHAGOGASTRODUODENOSCOPY  10/08/2010  . LAPAROSCOPY    . milk gland removal    . OOPHORECTOMY    . VAGINAL HYSTERECTOMY     Social History   Occupational History  . 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 Smoking status: Former Smoker    Packs/day: 0.50    Years: 16.00    Pack years: 8.00    Types: Cigarettes    Quit date: 08/09/1987    Years since quitting: 32.4  . Smokeless tobacco: Never Used  Substance and Sexual Activity  . Alcohol use: Yes    Alcohol/week: 0.0 standard drinks    Comment: Occasionally  . Drug use: No  .  Sexual activity: Not on file

## 2020-01-14 ENCOUNTER — Ambulatory Visit (INDEPENDENT_AMBULATORY_CARE_PROVIDER_SITE_OTHER): Payer: Medicare PPO

## 2020-01-14 ENCOUNTER — Ambulatory Visit (INDEPENDENT_AMBULATORY_CARE_PROVIDER_SITE_OTHER): Payer: Medicare PPO | Admitting: Physician Assistant

## 2020-01-14 ENCOUNTER — Encounter: Payer: Self-pay | Admitting: Physician Assistant

## 2020-01-14 ENCOUNTER — Other Ambulatory Visit: Payer: Self-pay

## 2020-01-14 VITALS — Ht 66.0 in | Wt 163.0 lb

## 2020-01-14 DIAGNOSIS — M79671 Pain in right foot: Secondary | ICD-10-CM | POA: Diagnosis not present

## 2020-01-14 MED ORDER — OXYCODONE-ACETAMINOPHEN 5-325 MG PO TABS: 1.0000 | ORAL_TABLET | Freq: Three times a day (TID) | ORAL | 0 refills | Status: AC | PRN

## 2020-01-14 NOTE — Progress Notes (Signed)
Post-Op Visit Note   Patient: [name]           Date of Birth: October 24, 1955           MRN: [id number] Visit Date: 01/14/2020 PCP: [name], MD  Chief Complaint:  Chief Complaint  Patient presents with  . Right Foot - Routine Post Op    12/17/19 right GT MTP joint fusion     HPI:  HPI The patient is a 64 year old woman seen today status post right great toe fusion on May 11.  She has been using scooter nonweightbearing.  She continues to have pain especially throbbing pain at night she is able to get some sleep when she takes her oxycodone. Ortho Exam Incision is well-healed there is very mild swelling no erythema no skin break no tenderness  Visit Diagnoses:  1. Pain in right foot     Plan: She may discontinue her kneeling scooter.  Advance her weightbearing as tolerated in a postop shoe she will follow-up in the office in 2 weeks.  Follow-Up Instructions: Return in about 2 weeks (around 01/28/2020).   Imaging: No results found.  Orders:  Orders Placed This Encounter  Procedures  . XR Foot 2 Views Right   Meds ordered this encounter  Medications  . oxyCODONE-acetaminophen (PERCOCET) 5-325 MG tablet    Sig: Take 1 tablet by mouth every 8 (eight) hours as needed for severe pain.    Dispense:  20 tablet    Refill:  0     PMFS History: Patient Active Problem List   Diagnosis Date Noted  . Hallux rigidus, right foot   . Chronic venous insufficiency 12/06/2017  . Raynauds syndrome 12/06/2017  . Hyperlipidemia 08/06/2017  . 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 Facet arthropathy 02/22/2016  . Migraine 09/28/2015  . Clubbed fingers 03/24/2015  . Multiple pulmonary nodules 03/23/2015  . Iron deficiency anemia 09/24/2014  . Non-celiac gluten sensitivity 04/16/2014  . Abdominal pain, unspecified site 02/12/2014  . IBS (irritable bowel syndrome) 09/05/2013  . Hair loss 04/03/2013  . Anxiety and depression 09/27/2011  . Syncope 03/09/2011  . ANEMIA, IRON  DEFICIENCY 09/14/2010  . CELIAC SPRUE 11/06/2009  . OTHER CONSTIPATION 10/06/2009  . ABDOMINAL PAIN, LEFT LOWER QUADRANT 08/18/2008  . Depression 10/22/2007  . GERD 10/22/2007  . CONSTIPATION 10/22/2007  . IBS 10/22/2007  . HEPATIC CYST 10/22/2007  . INTERSTITIAL CYSTITIS 10/22/2007   Past Medical History:  Diagnosis Date  . Adenomatous colon polyp   . Allergy    SEASONAL  . Anemia   . Anxiety   . Arthritis    BILATERAL HANDS,TOES ON RT. FOOT  . Bladder disease   . Chronic interstitial cystitis   . Depressive disorder, not elsewhere classified   . Esophageal reflux   . Fundic gland polyps of stomach, benign   . GERD (gastroesophageal reflux disease)   . Hemorrhoids   . Hepatic cyst   . Hyperlipidemia   . Irritable bowel syndrome   . Migraine   . Numbness and tingling   . Pulmonary nodules   . Rosacea   . Syncope   . Unspecified constipation   . Unsteady gait    Lips and chin    Family History  Problem Relation Age of Onset  . Heart attack Mother   . Bladder Cancer Mother   . Heart disease Mother   . Bladder Cancer Father   . Heart disease Father   . Pancreatic cancer Paternal Grandfather   .  Diabetes Paternal Grandmother   . Stroke Paternal Grandmother   . Colon cancer Neg Hx     Past Surgical History:  Procedure Laterality Date  . ARTHRODESIS METATARSALPHALANGEAL JOINT (MTPJ) Right 12/17/2019   Procedure: ARTHRODESIS METATARSALPHALANGEAL JOINT (MTPJ) RIGHT GREAT TOE;  Surgeon: Newt Minion, MD;  Location: [facility];  Service: Orthopedics;  Laterality: Right;  block in preop  . COLONOSCOPY  10/08/2010   Normal--multiple   . CYSTECTOMY    . DILATION AND CURETTAGE OF UTERUS    . ESOPHAGEAL MANOMETRY N/A 10/22/2012   Procedure: ESOPHAGEAL MANOMETRY (EM);  Surgeon: Sable Feil, MD;  Location: [facility];  Service: Endoscopy;  Laterality: N/A;  . ESOPHAGOGASTRODUODENOSCOPY  10/08/2010  . LAPAROSCOPY    . milk gland removal    .  OOPHORECTOMY    . VAGINAL HYSTERECTOMY     Social History   Occupational History  . 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 Smoking status: Former Smoker    Packs/day: 0.50    Years: 16.00    Pack years: 8.00    Types: Cigarettes    Quit date: 08/09/1987    Years since quitting: 32.4  . Smokeless tobacco: Never Used  Substance and Sexual Activity  . Alcohol use: Yes    Alcohol/week: 0.0 standard drinks    Comment: Occasionally  . Drug use: No  . Sexual activity: Not on file

## 2020-01-27 ENCOUNTER — Telehealth: Payer: Self-pay | Admitting: Internal Medicine

## 2020-01-28 ENCOUNTER — Other Ambulatory Visit: Payer: Self-pay

## 2020-01-28 ENCOUNTER — Encounter: Payer: Medicare PPO | Admitting: Internal Medicine

## 2020-01-28 ENCOUNTER — Ambulatory Visit (INDEPENDENT_AMBULATORY_CARE_PROVIDER_SITE_OTHER): Payer: Medicare PPO | Admitting: Physician Assistant

## 2020-01-28 ENCOUNTER — Encounter: Payer: Self-pay | Admitting: Physician Assistant

## 2020-01-28 VITALS — Ht 66.0 in | Wt 163.0 lb

## 2020-01-28 DIAGNOSIS — M79671 Pain in right foot: Secondary | ICD-10-CM

## 2020-01-28 NOTE — Progress Notes (Signed)
Office Visit Note   Patient: [name]           Date of Birth: 01-Jan-1956           MRN: [id number] Visit Date: 01/28/2020              Requested by: [name], MD [address] [place] [address] PCP: [name], MD  Chief Complaint  Patient presents with  . Right Foot - Routine Post Op    12/17/19 right GT MTP joint fusion       HPI: This is a pleasant 64 year old woman who is 6 weeks status post right great toe MTP arthrodesis.  She is wearing to postop shoes as she did not like the feeling of being on even and it was bothering her back.  Overall she is doing well but she says she cannot tolerate a soft on her foot.  She has not been doing much in the way of desensitization  Assessment & Plan: Visit Diagnoses: No diagnosis found.  Plan: She will follow up in 1 month.  I talked to her about transitioning to a supportive shoe such as a [place]oka.  We also discussed working on desensitization.  She may transition to her shoe as she feels comfortable  Follow-Up Instructions: No follow-ups on file.   Ortho Exam  Patient is alert, oriented, no adenopathy, well-dressed, normal affect, normal respiratory effort. Focused examination demonstrates well-healed surgical incision.  Mild soft tissue swelling.  MTP motion is rigid she does have good motion through the IP joint.  She does have some shininess to her skin.  No cellulitis she does have some sensitivity to touch  Imaging: No results found. No images are attached to the encounter.  Labs: Lab Results  Component Value Date   ESRSEDRATE 34 (H) 09/27/2011   ESRSEDRATE 34 (H) 10/06/2009   ESRSEDRATE 28 (H) 09/08/2009     Lab Results  Component Value Date   ALBUMIN 4.4 10/16/2017   ALBUMIN 4.0 04/22/2015   ALBUMIN 4.0 12/24/2014    Lab Results  Component Value Date   MG 2.0 10/04/2012   MG 2.0 09/14/2010   MG 2.3 10/06/2009   Lab Results  Component Value Date   VD25OH 30.31 04/16/2014   VD25OH 21  (L) 10/04/2012   VD25OH 35 09/27/2011    No results found for: PREALBUMIN CBC EXTENDED Latest Ref Rng & Units 04/19/2016 10/20/2015 04/22/2015  WBC 3.9 - 10.3 10e3/uL 6.9 6.8 7.1  RBC 3.70 - 5.45 10e6/uL 4.96 5.06 4.73  HGB 11.6 - 15.9 g/dL 15.1 15.3 14.7  HCT 34 - 46 % 45.2 46.5 43.8  PLT 145 - 400 10e3/uL 216 216 202  NEUTROABS 1.5 - 6.5 10e3/uL 4.4 3.4 4.0  LYMPHSABS 0.9 - 3.3 10e3/uL 1.8 2.6 2.3     Body mass index is 26.31 kg/m.  Orders:  No orders of the defined types were placed in this encounter.  No orders of the defined types were placed in this encounter.    Procedures: No procedures performed  Clinical Data: No additional findings.  ROS:  All other systems negative, except as noted in the HPI. Review of Systems  Objective: Vital Signs: Ht 5\' 6"  (1.676 m)   Wt 163 lb (73.9 kg)   BMI 26.31 kg/m   Specialty Comments:  No specialty comments available.  PMFS History: Patient Active Problem List   Diagnosis Date Noted  . Hallux rigidus, right foot   . Chronic  venous insufficiency 12/06/2017  . Raynauds syndrome 12/06/2017  . Hyperlipidemia 08/06/2017  . 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 Facet arthropathy 02/22/2016  . Migraine 09/28/2015  . Clubbed fingers 03/24/2015  . Multiple pulmonary nodules 03/23/2015  . Iron deficiency anemia 09/24/2014  . Non-celiac gluten sensitivity 04/16/2014  . Abdominal pain, unspecified site 02/12/2014  . IBS (irritable bowel syndrome) 09/05/2013  . Hair loss 04/03/2013  . Anxiety and depression 09/27/2011  . Syncope 03/09/2011  . ANEMIA, IRON DEFICIENCY 09/14/2010  . CELIAC SPRUE 11/06/2009  . OTHER CONSTIPATION 10/06/2009  . ABDOMINAL PAIN, LEFT LOWER QUADRANT 08/18/2008  . Depression 10/22/2007  . GERD 10/22/2007  . CONSTIPATION 10/22/2007  . IBS 10/22/2007  . HEPATIC CYST 10/22/2007  . INTERSTITIAL CYSTITIS 10/22/2007   Past Medical History:  Diagnosis Date  . Adenomatous colon polyp   . Allergy      SEASONAL  . Anemia   . Anxiety   . Arthritis    BILATERAL HANDS,TOES ON RT. FOOT  . Bladder disease   . Chronic interstitial cystitis   . Depressive disorder, not elsewhere classified   . Esophageal reflux   . Fundic gland polyps of stomach, benign   . GERD (gastroesophageal reflux disease)   . Hemorrhoids   . Hepatic cyst   . Hyperlipidemia   . Irritable bowel syndrome   . Migraine   . Numbness and tingling   . Pulmonary nodules   . Rosacea   . Syncope   . Unspecified constipation   . Unsteady gait    Lips and chin    Family History  Problem Relation Age of Onset  . Heart attack Mother   . Bladder Cancer Mother   . Heart disease Mother   . Bladder Cancer Father   . Heart disease Father   . Pancreatic cancer Paternal Grandfather   . Diabetes Paternal Grandmother   . Stroke Paternal Grandmother   . Colon cancer Neg Hx     Past Surgical History:  Procedure Laterality Date  . ARTHRODESIS METATARSALPHALANGEAL JOINT (MTPJ) Right 12/17/2019   Procedure: ARTHRODESIS METATARSALPHALANGEAL JOINT (MTPJ) RIGHT GREAT TOE;  Surgeon: Newt Minion, MD;  Location: [facility];  Service: Orthopedics;  Laterality: Right;  block in preop  . COLONOSCOPY  10/08/2010   Normal--multiple   . CYSTECTOMY    . DILATION AND CURETTAGE OF UTERUS    . ESOPHAGEAL MANOMETRY N/A 10/22/2012   Procedure: ESOPHAGEAL MANOMETRY (EM);  Surgeon: Sable Feil, MD;  Location: [facility];  Service: Endoscopy;  Laterality: N/A;  . ESOPHAGOGASTRODUODENOSCOPY  10/08/2010  . LAPAROSCOPY    . milk gland removal    . OOPHORECTOMY    . VAGINAL HYSTERECTOMY     Social History   Occupational History  . 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 Smoking status: Former Smoker    Packs/day: 0.50    Years: 16.00    Pack years: 8.00    Types: Cigarettes    Quit date: 08/09/1987    Years since quitting: 32.4  . Smokeless tobacco: Never Used   Vaping Use  . Vaping Use: Never used  Substance and Sexual Activity  . Alcohol use: Yes    Alcohol/week: 0.0 standard drinks    Comment: Occasionally  . Drug use: No  . Sexual activity: Not on file

## 2020-01-28 NOTE — Telephone Encounter (Signed)
Egd added to appt and pt aware.

## 2020-01-28 NOTE — Telephone Encounter (Signed)
Pt states she is having some discomfort when she swallows. Even reports it is uncomfortable when she swallows liquids. Pt wonders if Dr. Norman Herrlich feels she needs an EGD. Please advise.

## 2020-01-28 NOTE — Telephone Encounter (Signed)
She is scheduled for colon on 03/24/20 and EGD can be added to this procedure day (please block the 10 am slot which is currently open to allow for double) EGD for odynophagia and hx of GERD If this worsens she may need EGD before Aug JMP

## 2020-02-25 ENCOUNTER — Ambulatory Visit: Payer: Medicare PPO | Admitting: Physician Assistant

## 2020-02-26 ENCOUNTER — Ambulatory Visit: Payer: Medicare PPO | Admitting: Physician Assistant

## 2020-03-09 ENCOUNTER — Encounter: Payer: Self-pay | Admitting: Cardiovascular Disease

## 2020-03-09 NOTE — Progress Notes (Signed)
Cardiology Office Note:    Date:  03/10/2020   ID:  [name], DOB May 22, 1956, MRN [id number]  PCP:  [name], MD  Cardiologist:  Mertie Moores, MD    Referring MD: [name], MD   Problem List 1.  Hyperlipidemia 2.  Left arm pain  3.  Palpitations   Chief Complaint  Patient presents with  . Palpitations  . Hyperlipidemia       [name] is a 64 y.o. female with a hx of  Palpitations.  I saw [name] many years ago for palpitations  She is seen back today for eval of some left arm pain .   Left arm pain radiated up to her neck and left jaw.   She took an ASA and the pain resolved after an hour.  Described as a left arm burning. Started when she was in bed.     Stinging pain in her left side of neck and jaw.  Associated with heart fluttering.  Was slightly short of breath and was lightheaded.   Does not get much exercise.   Sits at a desk at work .    Has hx of hyperlipidemia  ( last LDL was 188) .   Tried crestor - made her feel like she had the flu.  She has not tried any other statins since that time. Mother had a MI in her 58s.   Dec 25, 2017: [name]  is seen today for follow-up of her palpitations and episode of chest discomfort.  Stress Myoview study on July 13, 2017 was normal.  She has no ischemia and has normal left ventricular systolic function. She had been having some leg discomfort. Dr. Gertie Fey ordered vascular studies   ankle brachial indexes were normal. Still eating more salt than she should . BP is a bit elevated.  No palpitation Lipids were a bit elevated.  - total chol 201,  LDL is 116. we  March 10, 2020: [name]  is seen today for follow-up visit.  She has a history of palpitations and also chest discomfort.  myoview study  in December, 2018 was normal.  She was last seen over 2 years ago. No further episodes of CP , Has a migraine HR fo rhte past several days   Had foot surgery in May . ( Dr. Sharol Given)  Still not able to get  out much  BP looks good today  Still eating too much salt according to her   Past Medical History:  Diagnosis Date  . Adenomatous colon polyp   . Allergy    SEASONAL  . Anemia    hx of  . Anxiety   . Arthritis    BILATERAL HANDS,TOES ON RT. FOOT  . Bladder disease   . Cataract   . Chronic interstitial cystitis   . Depressive disorder, not elsewhere classified   . Esophageal reflux   . Fundic gland polyps of stomach, benign   . GERD (gastroesophageal reflux disease)   . Hemorrhoids   . Hepatic cyst   . Hyperlipidemia   . Irritable bowel syndrome   . Migraine   . Numbness and tingling   . Pulmonary nodules   . Rosacea   . Syncope   . Unspecified constipation   . Unsteady gait    Lips and chin    Past Surgical History:  Procedure Laterality Date  . ARTHRODESIS METATARSALPHALANGEAL JOINT (MTPJ) Right 12/17/2019   Procedure: ARTHRODESIS METATARSALPHALANGEAL JOINT (MTPJ) RIGHT GREAT TOE;  Surgeon:  Newt Minion, MD;  Location: [facility];  Service: Orthopedics;  Laterality: Right;  block in preop  . COLONOSCOPY  10/08/2010   Normal--multiple   . CYSTECTOMY    . DILATION AND CURETTAGE OF UTERUS    . ESOPHAGEAL MANOMETRY N/A 10/22/2012   Procedure: ESOPHAGEAL MANOMETRY (EM);  Surgeon: Sable Feil, MD;  Location: [facility];  Service: Endoscopy;  Laterality: N/A;  . ESOPHAGOGASTRODUODENOSCOPY  10/08/2010  . LAPAROSCOPY    . milk gland removal    . OOPHORECTOMY    . UPPER GASTROINTESTINAL ENDOSCOPY    . VAGINAL HYSTERECTOMY      Current Medications: Current Meds  Medication Sig  . AMBULATORY NON FORMULARY MEDICATION 1 tablet daily. Wilberth Damon's Colon Health  . atorvastatin (LIPITOR) 20 MG tablet Take 1 tablet (20 mg total) by mouth daily.  Marland Kitchen CALCIUM-VITAMIN D PO Take 1 tablet by mouth daily. Calcium 24m Vitamin d3 100 units magnesium 53m . clonazePAM (KLONOPIN) 0.5 MG tablet Take 1 tablet (0.5 mg total) by mouth 2 (two) times daily as needed.  .  cyclobenzaprine (FLEXERIL) 10 MG tablet Take 1 tablet (10 mg total) by mouth at bedtime as needed for muscle spasms.  . Marland Kitchenicyclomine (BENTYL) 10 MG capsule Take 1 tab every 6 hours as needed for cramping, abdominal spasms.  . Marland Kitchenscitalopram (LEXAPRO) 20 MG tablet Take 20 mg by mouth daily.    . meloxicam (MOBIC) 15 MG tablet Take 1 tablet by mouth as needed. Takes 7.5 mg  . Multiple Vitamins-Minerals (MULTIVITAMIN WOMENS 50+ ADV PO) Take 1 tablet by mouth daily.  . Na Sulfate-K Sulfate-Mg Sulf (SUPREP BOWEL PREP KIT) 17.5-3.13-1.6 GM/177ML SOLN Take 1 kit by mouth once for 1 dose. Apply Coupon=BIN: [id number]: CN GROUP: : [id number]: : [id number]. nortriptyline (PAMELOR) 10 MG capsule Take 2 capsules (20 mg total) by mouth at bedtime.  . Marland KitchenxyCODONE-acetaminophen (PERCOCET) 5-325 MG tablet Take 1 tablet by mouth every 8 (eight) hours as needed for severe pain.  . pantoprazole (PROTONIX) 40 MG tablet Take 1 tablet (40 mg total) by mouth 2 (two) times daily before a meal.  . SUMAtriptan (IMITREX) 100 MG tablet Take 1 tablet (100 mg total) by mouth every 2 (two) hours as needed for migraine. May repeat in 2 hours if headache persists or recurs.  . Vitamin D, Cholecalciferol, 1000 units CAPS Take 5,000 Units by mouth daily.      Allergies:   Prednisone, Aspirin, and Topamax [topiramate]   Social History   Socioeconomic History  . Marital status: Married    Spouse name: Not on file  . Number of children: 3  . Years of education: 1465. Highest education level: Not on file  Occupational History  . 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 Smoking status: Former Smoker    Packs/day: 0.50    Years: 16.00    Pack years: 8.00    Types: Cigarettes    Quit date: 08/09/1987    Years since quitting: 32.6  . Smokeless tobacco: Never Used  Vaping Use  . Vaping Use: Never used  Substance and Sexual Activity  . Alcohol use: Yes    Alcohol/week: 0.0  standard drinks    Comment: Occasionally  . Drug use: No  . Sexual activity: Not on file  Other Topics Concern  . Not on file  Social History Narrative   Lives at home with husband.   Right-handed.  1 cup caffeine per day.   Social Determinants of Health   Financial Resource Strain:   . Difficulty of Paying Living Expenses:   Food Insecurity:   . Worried About Charity fundraiser in the Last Year:   . Arboriculturist in the Last Year:   Transportation Needs:   . Film/video editor (Medical):   Marland Kitchen Lack of Transportation (Non-Medical):   Physical Activity:   . Days of Exercise per Week:   . Minutes of Exercise per Session:   Stress:   . Feeling of Stress :   Social Connections:   . Frequency of Communication with Friends and Family:   . Frequency of Social Gatherings with Friends and Family:   . Attends Religious Services:   . Active Member of Clubs or Organizations:   . Attends Archivist Meetings:   Marland Kitchen Marital Status:      Family History: The patient's family history includes Bladder Cancer in her father and mother; Diabetes in her paternal grandmother; Heart attack in her mother; Heart disease in her father and mother; Pancreatic cancer in her paternal grandfather; Stomach cancer in her paternal grandfather; Stroke in her paternal grandmother. There is no history of Colon cancer, Esophageal cancer, or Rectal cancer. ROS:   Noted in current hx   EKGs/Labs/Other Studies Reviewed:    The following studies were reviewed today:   EKG:  EKG is  ordered today.  The ekg ordered today demonstrates NSR at 64.     Recent Labs: No results found for requested labs within last 8760 hours.  Recent Lipid Panel    Component Value Date/Time   CHOL 201 (H) 10/16/2017 0918   TRIG 130 10/16/2017 0918   HDL 59 10/16/2017 0918   CHOLHDL 3.4 10/16/2017 0918   LDLCALC 116 (H) 10/16/2017 0918    Physical Exam:    Physical Exam: Blood pressure 126/82, pulse 68,  height '5\' 6"'  (1.676 m), weight 164 lb 3.2 oz (74.5 kg), SpO2 98 %.  GEN:  Well nourished, well developed in no acute distress HEENT: Normal NECK: No JVD; No carotid bruits LYMPHATICS: No lymphadenopathy CARDIAC: RRR , no murmurs, rubs, gallops RESPIRATORY:  Clear to auscultation without rales, wheezing or rhonchi  ABDOMEN: Soft, non-tender, non-distended MUSCULOSKELETAL:  No edema; No deformity  SKIN: Warm and dry NEUROLOGIC:  Alert and oriented x 3  ECG :   NSR at 68.  No ST or T wave changes.   ASSESSMENT:    1. Precordial pain   2. Syncope, unspecified syncope type    PLAN:    In order of problems listed above:  1.  Chest pain :   No further episodes of CP .   Negative myoview several years ago.  Cont to follow  She will have primary md send is lipid levels   2.  Hyperlipidemia:  :  Managed by primary care   3.  Palpitations: No recent palpitations.   Medication Adjustments/Labs and Tests Ordered: Current medicines are reviewed at length with the patient today.  Concerns regarding medicines are outlined above.  Orders Placed This Encounter  Procedures  . EKG 12-Lead   No orders of the defined types were placed in this encounter.  Will see her in 1 year    Signed, Mertie Moores, MD  03/10/2020 5:19 PM    [place]

## 2020-03-10 ENCOUNTER — Ambulatory Visit (INDEPENDENT_AMBULATORY_CARE_PROVIDER_SITE_OTHER): Payer: Medicare PPO | Admitting: Physician Assistant

## 2020-03-10 ENCOUNTER — Ambulatory Visit (AMBULATORY_SURGERY_CENTER): Payer: Self-pay | Admitting: *Deleted

## 2020-03-10 ENCOUNTER — Encounter: Payer: Self-pay | Admitting: Cardiovascular Disease

## 2020-03-10 ENCOUNTER — Other Ambulatory Visit: Payer: Self-pay

## 2020-03-10 ENCOUNTER — Encounter: Payer: Self-pay | Admitting: Physician Assistant

## 2020-03-10 ENCOUNTER — Encounter: Payer: Self-pay | Admitting: Internal Medicine

## 2020-03-10 ENCOUNTER — Ambulatory Visit: Payer: Medicare PPO | Admitting: Cardiovascular Disease

## 2020-03-10 VITALS — BP 126/82 | HR 68 | Ht 66.0 in | Wt 164.2 lb

## 2020-03-10 VITALS — Ht 66.0 in | Wt 164.0 lb

## 2020-03-10 VITALS — Ht 66.0 in | Wt 163.0 lb

## 2020-03-10 DIAGNOSIS — R072 Precordial pain: Secondary | ICD-10-CM

## 2020-03-10 DIAGNOSIS — R55 Syncope and collapse: Secondary | ICD-10-CM

## 2020-03-10 DIAGNOSIS — M2021 Hallux rigidus, right foot: Secondary | ICD-10-CM

## 2020-03-10 DIAGNOSIS — R079 Chest pain, unspecified: Secondary | ICD-10-CM | POA: Diagnosis not present

## 2020-03-10 DIAGNOSIS — K219 Gastro-esophageal reflux disease without esophagitis: Secondary | ICD-10-CM

## 2020-03-10 DIAGNOSIS — Z86010 Personal history of colonic polyps: Secondary | ICD-10-CM

## 2020-03-10 DIAGNOSIS — Z01818 Encounter for other preprocedural examination: Secondary | ICD-10-CM

## 2020-03-10 DIAGNOSIS — R1310 Dysphagia, unspecified: Secondary | ICD-10-CM

## 2020-03-10 DIAGNOSIS — M79671 Pain in right foot: Secondary | ICD-10-CM

## 2020-03-10 MED ORDER — SUPREP BOWEL PREP KIT 17.5-3.13-1.6 GM/177ML PO SOLN: 1.0000 | Freq: Once | ORAL | 0 refills | Status: AC

## 2020-03-10 NOTE — Progress Notes (Signed)
Office Visit Note   Patient: [name]           Date of Birth: May 06, 1956           MRN: [id number] Visit Date: 03/10/2020              Requested by: [name], MD [address] [place] [address] PCP: [name], MD  Chief Complaint  Patient presents with  . Right Foot - Routine Post Op    12/17/19 right GT MTP joint fusion     . Left Achilles Tendon - Pain      HPI: Patient presents today status post first MTP arthrodesis on the right.  She reports she is doing much better and for the most part wears regular shoes.  Today she comes in with her postop shoes which she uses when her foot is sore.  Her only concern is that she has some Achilles pain on the left side.  She is wondering if this is because she has been compensating for the right side  Assessment & Plan: Visit Diagnoses: No diagnosis found.  Plan: Achilles stretching was demonstrated to her.  I have given her a very thin heel pad to take off some of the pressure when she is ambulating.  I think at this time she should be in her supportive tennis shoes rather than the postop shoes.  We will follow up in 6 weeks for final visit  Follow-Up Instructions: No follow-ups on file.   Ortho Exam  Patient is alert, oriented, no adenopathy, well-dressed, normal affect, normal respiratory effort. Focused examination of her right foot demonstrates well-healed surgical incision.  No swelling no cellulitis first MTP motion is rigid as would be expected.  On the left Achilles she does have some tenderness over the distal Achilles however the Achilles is completely intact.  Good plantar flexion strength.  She does have about a 15 degree dorsiflexion difference with having less dorsiflexion on the left  Imaging: No results found. No images are attached to the encounter.  Labs: Lab Results  Component Value Date   ESRSEDRATE 34 (H) 09/27/2011   ESRSEDRATE 34 (H) 10/06/2009   ESRSEDRATE 28 (H) 09/08/2009      Lab Results  Component Value Date   ALBUMIN 4.4 10/16/2017   ALBUMIN 4.0 04/22/2015   ALBUMIN 4.0 12/24/2014    Lab Results  Component Value Date   MG 2.0 10/04/2012   MG 2.0 09/14/2010   MG 2.3 10/06/2009   Lab Results  Component Value Date   VD25OH 30.31 04/16/2014   VD25OH 21 (L) 10/04/2012   VD25OH 35 09/27/2011    No results found for: PREALBUMIN CBC EXTENDED Latest Ref Rng & Units 04/19/2016 10/20/2015 04/22/2015  WBC 3.9 - 10.3 10e3/uL 6.9 6.8 7.1  RBC 3.70 - 5.45 10e6/uL 4.96 5.06 4.73  HGB 11.6 - 15.9 g/dL 15.1 15.3 14.7  HCT 34 - 46 % 45.2 46.5 43.8  PLT 145 - 400 10e3/uL 216 216 202  NEUTROABS 1.5 - 6.5 10e3/uL 4.4 3.4 4.0  LYMPHSABS 0.9 - 3.3 10e3/uL 1.8 2.6 2.3     Body mass index is 26.47 kg/m.  Orders:  No orders of the defined types were placed in this encounter.  No orders of the defined types were placed in this encounter.    Procedures: No procedures performed  Clinical Data: No additional findings.  ROS:  All other systems negative, except as noted in the HPI. Review of  Systems  Objective: Vital Signs: Ht 5\' 6"  (1.676 m)   Wt 164 lb (74.4 kg)   BMI 26.47 kg/m   Specialty Comments:  No specialty comments available.  PMFS History: Patient Active Problem List   Diagnosis Date Noted  . Hallux rigidus, right foot   . Chronic venous insufficiency 12/06/2017  . Raynauds syndrome 12/06/2017  . Hyperlipidemia 08/06/2017  . 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 Facet arthropathy 02/22/2016  . Migraine 09/28/2015  . Clubbed fingers 03/24/2015  . Multiple pulmonary nodules 03/23/2015  . Iron deficiency anemia 09/24/2014  . Non-celiac gluten sensitivity 04/16/2014  . Abdominal pain, unspecified site 02/12/2014  . IBS (irritable bowel syndrome) 09/05/2013  . Hair loss 04/03/2013  . Anxiety and depression 09/27/2011  . Syncope 03/09/2011  . ANEMIA, IRON DEFICIENCY 09/14/2010  . CELIAC SPRUE 11/06/2009  . OTHER CONSTIPATION  10/06/2009  . ABDOMINAL PAIN, LEFT LOWER QUADRANT 08/18/2008  . Depression 10/22/2007  . GERD 10/22/2007  . CONSTIPATION 10/22/2007  . IBS 10/22/2007  . HEPATIC CYST 10/22/2007  . INTERSTITIAL CYSTITIS 10/22/2007   Past Medical History:  Diagnosis Date  . Adenomatous colon polyp   . Allergy    SEASONAL  . Anemia    hx of  . Anxiety   . Arthritis    BILATERAL HANDS,TOES ON RT. FOOT  . Bladder disease   . Cataract   . Chronic interstitial cystitis   . Depressive disorder, not elsewhere classified   . Esophageal reflux   . Fundic gland polyps of stomach, benign   . GERD (gastroesophageal reflux disease)   . Hemorrhoids   . Hepatic cyst   . Hyperlipidemia   . Irritable bowel syndrome   . Migraine   . Numbness and tingling   . Pulmonary nodules   . Rosacea   . Syncope   . Unspecified constipation   . Unsteady gait    Lips and chin    Family History  Problem Relation Age of Onset  . Heart attack Mother   . Bladder Cancer Mother   . Heart disease Mother   . Bladder Cancer Father   . Heart disease Father   . Pancreatic cancer Paternal Grandfather   . Stomach cancer Paternal Grandfather   . Diabetes Paternal Grandmother   . Stroke Paternal Grandmother   . Colon cancer Neg Hx   . Esophageal cancer Neg Hx   . Rectal cancer Neg Hx     Past Surgical History:  Procedure Laterality Date  . ARTHRODESIS METATARSALPHALANGEAL JOINT (MTPJ) Right 12/17/2019   Procedure: ARTHRODESIS METATARSALPHALANGEAL JOINT (MTPJ) RIGHT GREAT TOE;  Surgeon: Newt Minion, MD;  Location: [facility];  Service: Orthopedics;  Laterality: Right;  block in preop  . COLONOSCOPY  10/08/2010   Normal--multiple   . CYSTECTOMY    . DILATION AND CURETTAGE OF UTERUS    . ESOPHAGEAL MANOMETRY N/A 10/22/2012   Procedure: ESOPHAGEAL MANOMETRY (EM);  Surgeon: Sable Feil, MD;  Location: [facility];  Service: Endoscopy;  Laterality: N/A;  . ESOPHAGOGASTRODUODENOSCOPY  10/08/2010  .  LAPAROSCOPY    . milk gland removal    . OOPHORECTOMY    . UPPER GASTROINTESTINAL ENDOSCOPY    . VAGINAL HYSTERECTOMY     Social History   Occupational History  . 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 Smoking status: Former Smoker    Packs/day: 0.50    Years: 16.00    Pack years: 8.00  Types: Cigarettes    Quit date: 08/09/1987    Years since quitting: 32.6  . Smokeless tobacco: Never Used  Vaping Use  . Vaping Use: Never used  Substance and Sexual Activity  . Alcohol use: Yes    Alcohol/week: 0.0 standard drinks    Comment: Occasionally  . Drug use: No  . Sexual activity: Not on file

## 2020-03-10 NOTE — Progress Notes (Signed)
covid test 03-19-20 at 1230 pm  Pt is aware that care partner will wait in the car during procedure; if they feel like they will be too hot or cold to wait in the car; they may wait in the 4 th floor lobby. Patient is aware to bring only one care partner. We want them to wear a mask (we do not have any that we can provide them), practice social distancing, and we will check their temperatures when they get here.  I did remind the patient that their care partner needs to stay in the parking lot the entire time and have a cell phone available, we will call them when the pt is ready for discharge. Patient will wear mask into building.   No trouble with anesthesia, difficulty with intubation or hx/fam hx of malignant hyperthermia per pt   No egg or soy allergy  No home oxygen use   No medications for weight loss taken  Pt denies constipation issues  Suprep coupon given to pt.

## 2020-03-10 NOTE — Patient Instructions (Signed)
Medication Instructions:  Your physician recommends that you continue on your current medications as directed. Please refer to the Current Medication list given to you today.  *If you need a refill on your cardiac medications before your next appointment, please call your pharmacy*   Follow-Up: At [facility], you and your health needs are our priority.  As part of our continuing mission to provide you with exceptional heart care, we have created designated Provider Care Teams.  These Care Teams include your primary Cardiologist (physician) and Advanced Practice Providers (APPs -  Physician Assistants and Nurse Practitioners) who all work together to provide you with the care you need, when you need it.  Your next appointment:   1 year(s)  The format for your next appointment:   In Person  Provider:   You may see Philip Nahser, MD or one of the following Advanced Practice Providers on your designated Care Team:   Scott Weaver, PA-C Vin Bhagat, PA-C   

## 2020-03-19 ENCOUNTER — Other Ambulatory Visit: Payer: Self-pay | Admitting: Internal Medicine

## 2020-03-19 ENCOUNTER — Ambulatory Visit (INDEPENDENT_AMBULATORY_CARE_PROVIDER_SITE_OTHER): Payer: Medicare PPO

## 2020-03-19 DIAGNOSIS — Z1159 Encounter for screening for other viral diseases: Secondary | ICD-10-CM

## 2020-03-19 LAB — SARS CORONAVIRUS 2 (TAT 6-24 HRS): SARS Coronavirus 2: NEGATIVE

## 2020-03-24 ENCOUNTER — Ambulatory Visit (AMBULATORY_SURGERY_CENTER): Payer: Medicare PPO | Admitting: Internal Medicine

## 2020-03-24 ENCOUNTER — Other Ambulatory Visit: Payer: Self-pay

## 2020-03-24 ENCOUNTER — Encounter: Payer: Self-pay | Admitting: Internal Medicine

## 2020-03-24 VITALS — BP 162/87 | HR 56 | Temp 97.3°F | Resp 14 | Ht 66.0 in | Wt 163.0 lb

## 2020-03-24 DIAGNOSIS — D122 Benign neoplasm of ascending colon: Secondary | ICD-10-CM

## 2020-03-24 DIAGNOSIS — Z86010 Personal history of colonic polyps: Secondary | ICD-10-CM

## 2020-03-24 DIAGNOSIS — D120 Benign neoplasm of cecum: Secondary | ICD-10-CM

## 2020-03-24 DIAGNOSIS — D124 Benign neoplasm of descending colon: Secondary | ICD-10-CM

## 2020-03-24 DIAGNOSIS — K219 Gastro-esophageal reflux disease without esophagitis: Secondary | ICD-10-CM

## 2020-03-24 MED ORDER — SODIUM CHLORIDE 0.9 % IV SOLN: 500.0000 mL | INTRAVENOUS | Status: AC

## 2020-03-24 NOTE — Progress Notes (Signed)
Vs CW I have reviewed the patient's medical history in detail and updated the computerized patient record.   

## 2020-03-24 NOTE — Op Note (Signed)
[facility] Patient Name: [name] Procedure Date: 03/24/2020 9:23 AM MRN: [id number] Endoscopist: Jerene Bears , MD Age: 64 Referring MD:  Date of Birth: April 29, 1956 Gender: Female Account #: [id number] Procedure:                Upper GI endoscopy Indications:              Gastro-esophageal reflux disease Medicines:                Monitored Anesthesia Care Procedure:                Pre-Anesthesia Assessment:                           - Prior to the procedure, a History and Physical                            was performed, and patient medications and                            allergies were reviewed. The patient's tolerance of                            previous anesthesia was also reviewed. The risks                            and benefits of the procedure and the sedation                            options and risks were discussed with the patient.                            All questions were answered, and informed consent                            was obtained. Prior Anticoagulants: The patient has                            taken no previous anticoagulant or antiplatelet                            agents. ASA Grade Assessment: II - A patient with                            mild systemic disease. After reviewing the risks                            and benefits, the patient was deemed in                            satisfactory condition to undergo the procedure.                           After obtaining informed consent, the endoscope was  passed under direct vision. Throughout the                            procedure, the patient's blood pressure, pulse, and                            oxygen saturations were monitored continuously. The                            Endoscope was introduced through the mouth, and                            advanced to the second part of duodenum. The upper                            GI endoscopy was  accomplished without difficulty.                            The patient tolerated the procedure well. Scope In: Scope Out: Findings:                 The esophagus was normal.                           The stomach was normal.                           The examined duodenum was normal. Complications:            No immediate complications. Estimated Blood Loss:     Estimated blood loss: none. Impression:               - Normal esophagus.                           - Normal stomach.                           - Normal examined duodenum.                           - No specimens collected. Recommendation:           - Patient has a contact number available for                            emergencies. The signs and symptoms of potential                            delayed complications were discussed with the                            patient. Return to normal activities tomorrow.                            Written discharge instructions were provided to the  patient.                           - Resume previous diet.                           - Continue present medications. Jerene Bears, MD 03/24/2020 10:02:47 AM This report has been signed electronically.

## 2020-03-24 NOTE — Progress Notes (Signed)
Called to room to assist during endoscopic procedure.  Patient ID and intended procedure confirmed with present staff. Received instructions for my participation in the procedure from the performing physician.  

## 2020-03-24 NOTE — Op Note (Signed)
[place] Patient Name: [name] Procedure Date: 03/24/2020 9:22 AM MRN: [id number] Endoscopist: Jerene Bears , MD Age: 64 Referring MD:  Date of Birth: 1955-12-12 Gender: Female Account #: [id number] Procedure:                Colonoscopy Indications:              High risk colon cancer surveillance: Personal                            history of sessile serrated colon polyp (less than                            10 mm in size) with no dysplasia, Last colonoscopy                            5 years ago Medicines:                Monitored Anesthesia Care Procedure:                Pre-Anesthesia Assessment:                           - Prior to the procedure, a History and Physical                            was performed, and patient medications and                            allergies were reviewed. The patient's tolerance of                            previous anesthesia was also reviewed. The risks                            and benefits of the procedure and the sedation                            options and risks were discussed with the patient.                            All questions were answered, and informed consent                            was obtained. Prior Anticoagulants: The patient has                            taken no previous anticoagulant or antiplatelet                            agents. ASA Grade Assessment: II - A patient with                            mild systemic disease. After reviewing the risks  and benefits, the patient was deemed in                            satisfactory condition to undergo the procedure.                           After obtaining informed consent, the colonoscope                            was passed under direct vision. Throughout the                            procedure, the patient's blood pressure, pulse, and                            oxygen saturations were monitored continuously. The                             Colonoscope was introduced through the anus and                            advanced to the cecum, identified by appendiceal                            orifice and ileocecal valve. The colonoscopy was                            performed without difficulty. The patient tolerated                            the procedure well. The quality of the bowel                            preparation was good. The ileocecal valve,                            appendiceal orifice, and rectum were photographed. Scope In: 9:36:11 AM Scope Out: 9:57:43 AM Scope Withdrawal Time: 0 hours 17 minutes 56 seconds  Total Procedure Duration: 0 hours 21 minutes 32 seconds  Findings:                 The digital rectal exam was normal.                           A 3 mm polyp was found in the cecum. The polyp was                            sessile. The polyp was removed with a cold snare.                            Resection and retrieval were complete.                           A 6 mm polyp was found in  the ascending colon. The                            polyp was sessile. The polyp was removed with a                            cold snare. Resection and retrieval were complete.                           A 5 mm polyp was found in the descending colon. The                            polyp was sessile. The polyp was removed with a                            cold snare. Resection and retrieval were complete.                           There was a small lipoma, at the hepatic flexure.                           A diffuse area of mild melanosis was found in the                            entire colon.                           The retroflexed view of the distal rectum and anal                            verge was normal and showed no anal or rectal                            abnormalities. Complications:            No immediate complications. Estimated Blood Loss:     Estimated blood loss was  minimal. Impression:               - One 3 mm polyp in the cecum, removed with a cold                            snare. Resected and retrieved.                           - One 6 mm polyp in the ascending colon, removed                            with a cold snare. Resected and retrieved.                           - One 5 mm polyp in the descending colon, removed                            with  a cold snare. Resected and retrieved.                           - Small lipoma at the hepatic flexure.                           - Melanosis in the colon.                           - The distal rectum and anal verge are normal on                            retroflexion view. Recommendation:           - Patient has a contact number available for                            emergencies. The signs and symptoms of potential                            delayed complications were discussed with the                            patient. Return to normal activities tomorrow.                            Written discharge instructions were provided to the                            patient.                           - Resume previous diet.                           - Continue present medications.                           - Await pathology results.                           - Repeat colonoscopy is recommended for                            surveillance. The colonoscopy date will be                            determined after pathology results from today's                            exam become available for review. Jerene Bears, MD 03/24/2020 10:05:36 AM This report has been signed electronically.

## 2020-03-24 NOTE — Progress Notes (Signed)
.  lidocaine buffer  robinol antisialagogue

## 2020-03-24 NOTE — Patient Instructions (Signed)
YOU HAD AN ENDOSCOPIC PROCEDURE TODAY AT [facility]:   Refer to the procedure report that was given to you for any specific questions about what was found during the examination.  If the procedure report does not answer your questions, please call your gastroenterologist to clarify.  If you requested that your care partner not be given the details of your procedure findings, then the procedure report has been included in a sealed envelope for you to review at your convenience later.  YOU SHOULD EXPECT: Some feelings of bloating in the abdomen. Passage of more gas than usual.  Walking can help get rid of the air that was put into your GI tract during the procedure and reduce the bloating. If you had a lower endoscopy (such as a colonoscopy or flexible sigmoidoscopy) you may notice spotting of blood in your stool or on the toilet paper. If you underwent a bowel prep for your procedure, you may not have a normal bowel movement for a few days.  Please Note:  You might notice some irritation and congestion in your nose or some drainage.  This is from the oxygen used during your procedure.  There is no need for concern and it should clear up in a day or so.  **Handout given on polyps**  SYMPTOMS TO REPORT IMMEDIATELY:   Following lower endoscopy (colonoscopy or flexible sigmoidoscopy):  Excessive amounts of blood in the stool  Significant tenderness or worsening of abdominal pains  Swelling of the abdomen that is new, acute  Fever of 100F or higher   Following upper endoscopy (EGD)  Vomiting of blood or coffee ground material  New chest pain or pain under the shoulder blades  Painful or persistently difficult swallowing  New shortness of breath  Fever of 100F or higher  Black, tarry-looking stools  For urgent or emergent issues, a gastroenterologist can be reached at any hour by calling [phone]. Do not use MyChart messaging for urgent concerns.    DIET:  We do  recommend a small meal at first, but then you may proceed to your regular diet.  Drink plenty of fluids but you should avoid alcoholic beverages for 24 hours.  ACTIVITY:  You should plan to take it easy for the rest of today and you should NOT DRIVE or use heavy machinery until tomorrow (because of the sedation medicines used during the test).    FOLLOW UP: Our staff will call the number listed on your records 48-72 hours following your procedure to check on you and address any questions or concerns that you may have regarding the information given to you following your procedure. If we do not reach you, we will leave a message.  We will attempt to reach you two times.  During this call, we will ask if you have developed any symptoms of COVID 19. If you develop any symptoms (ie: fever, flu-like symptoms, shortness of breath, cough etc.) before then, please call [phone].  If you test positive for Covid 19 in the 2 weeks post procedure, please call and report this information to [place].    If any biopsies were taken you will be contacted by phone or by letter within the next 1-3 weeks.  Please call us at [phone] if you have not heard about the biopsies in 3 weeks.    SIGNATURES/CONFIDENTIALITY: You and/or your care partner have signed paperwork which will be entered into your electronic medical record.  These signatures attest to the fact that  that the information above on your After Visit Summary has been reviewed and is understood.  Full responsibility of the confidentiality of this discharge information lies with you and/or your care-partner.

## 2020-03-24 NOTE — Progress Notes (Signed)
A and O x3. Report to RN. Tolerated MAC anesthesia well.Teeth unchanged after procedure. 

## 2020-03-26 ENCOUNTER — Encounter: Payer: Self-pay | Admitting: Internal Medicine

## 2020-03-26 ENCOUNTER — Telehealth: Payer: Self-pay

## 2020-03-26 NOTE — Telephone Encounter (Signed)
  Follow up Call-  Call back number 03/24/2020  Post procedure Call Back phone  # [phone]  Permission to leave phone message Yes  Some recent data might be hidden     Patient questions:  Do you have a fever, pain , or abdominal swelling? No. Pain Score  0 *  Have you tolerated food without any problems? Yes.    Have you been able to return to your normal activities? Yes.    Do you have any questions about your discharge instructions: Diet   No. Medications  No. Follow up visit  No.  Do you have questions or concerns about your Care? No.  Actions: * If pain score is 4 or above: No action needed, pain <4.   1. Have you developed a fever since your procedure? No   2.   Have you had an respiratory symptoms (SOB or cough) since your procedure? No   3.   Have you tested positive for COVID 19 since your procedure? No   4.   Have you had any family members/close contacts diagnosed with the COVID 19 since your procedure?  No    If yes to any of these questions please route to Joylene John, RN and Joella Prince, RN

## 2020-04-21 ENCOUNTER — Ambulatory Visit (INDEPENDENT_AMBULATORY_CARE_PROVIDER_SITE_OTHER): Payer: Medicare PPO | Admitting: Physician Assistant

## 2020-04-21 ENCOUNTER — Encounter: Payer: Self-pay | Admitting: Physician Assistant

## 2020-04-21 DIAGNOSIS — M2021 Hallux rigidus, right foot: Secondary | ICD-10-CM

## 2020-04-21 NOTE — Progress Notes (Signed)
Office Visit Note   Patient: [name]           Date of Birth: [birth year]           MRN: [id number] Visit Date: 04/21/2020              Requested by: [name], MD [address] [place] [address] PCP: [name], MD  Chief Complaint  Patient presents with  . Right Foot - Follow-up    12/17/19 right GT MTP fusion       HPI: Patient is here for follow-up for 2 reasons 1 her right first MTP arthrodesis.  With regards to this she is doing quite well.  She has occasional neuropathic symptoms but this is not bothering to her second she continues to follow-up on her left Achilles pain.  She feels it is getting better but still has difficulty when she first gets out of bed going up and down stairs and does not like to flex her ankle.  Assessment & Plan: Visit Diagnoses: No diagnosis found.  Plan: We talked about her possibly doing an eccentric strengthening program but she would like to just continue with stretching for now.  She will follow-up as needed  Follow-Up Instructions: No follow-ups on file.   Ortho Exam  Patient is alert, oriented, no adenopathy, well-dressed, normal affect, normal respiratory effort. Focused examination demonstrates well-healed surgical incision.  No swelling she has flexion to the IP joint but first MTP joint is rigid.  No tenderness no cellulitis.  Left ankle she has dorsiflexion to about neutral but this improves with stretching.  I cannot palpate any abnormalities within the Achilles tendon.  Distal CMS is intact.  Imaging: No results found. No images are attached to the encounter.  Labs: Lab Results  Component Value Date   ESRSEDRATE 34 (H) 09/27/2011   ESRSEDRATE 34 (H) 10/06/2009   ESRSEDRATE 28 (H) 09/08/2009     Lab Results  Component Value Date   ALBUMIN 4.4 10/16/2017   ALBUMIN 4.0 04/22/2015   ALBUMIN 4.0 12/24/2014    Lab Results  Component Value Date   MG 2.0 10/04/2012   MG 2.0 09/14/2010   MG 2.3  10/06/2009   Lab Results  Component Value Date   VD25OH 30.31 04/16/2014   VD25OH 21 (L) 10/04/2012   VD25OH 35 09/27/2011    No results found for: PREALBUMIN CBC EXTENDED Latest Ref Rng & Units 04/19/2016 10/20/2015 04/22/2015  WBC 3.9 - 10.3 10e3/uL 6.9 6.8 7.1  RBC 3.70 - 5.45 10e6/uL 4.96 5.06 4.73  HGB 11.6 - 15.9 g/dL 15.1 15.3 14.7  HCT 34 - 46 % 45.2 46.5 43.8  PLT 145 - 400 10e3/uL 216 216 202  NEUTROABS 1.5 - 6.5 10e3/uL 4.4 3.4 4.0  LYMPHSABS 0.9 - 3.3 10e3/uL 1.8 2.6 2.3     There is no height or weight on file to calculate BMI.  Orders:  No orders of the defined types were placed in this encounter.  No orders of the defined types were placed in this encounter.    Procedures: No procedures performed  Clinical Data: No additional findings.  ROS:  All other systems negative, except as noted in the HPI. Review of Systems  Objective: Vital Signs: There were no vitals taken for this visit.  Specialty Comments:  No specialty comments available.  PMFS History: Patient Active Problem List   Diagnosis Date Noted  . Chest pain of uncertain etiology 67/59/1638  . Hallux  rigidus, right foot   . Chronic venous insufficiency 12/06/2017  . Raynauds syndrome 12/06/2017  . Hyperlipidemia 08/06/2017  . 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 Facet arthropathy 02/22/2016  . Migraine 09/28/2015  . Clubbed fingers 03/24/2015  . Multiple pulmonary nodules 03/23/2015  . Iron deficiency anemia 09/24/2014  . Non-celiac gluten sensitivity 04/16/2014  . Abdominal pain, unspecified site 02/12/2014  . IBS (irritable bowel syndrome) 09/05/2013  . Hair loss 04/03/2013  . Anxiety and depression 09/27/2011  . Syncope 03/09/2011  . ANEMIA, IRON DEFICIENCY 09/14/2010  . CELIAC SPRUE 11/06/2009  . OTHER CONSTIPATION 10/06/2009  . ABDOMINAL PAIN, LEFT LOWER QUADRANT 08/18/2008  . Depression 10/22/2007  . GERD 10/22/2007  . CONSTIPATION 10/22/2007  . IBS 10/22/2007  .  HEPATIC CYST 10/22/2007  . INTERSTITIAL CYSTITIS 10/22/2007   Past Medical History:  Diagnosis Date  . Adenomatous colon polyp   . Allergy    SEASONAL  . Anemia    hx of  . Anxiety   . Arthritis    BILATERAL HANDS,TOES ON RT. FOOT  . Bladder disease   . Cataract   . Chronic interstitial cystitis   . Depressive disorder, not elsewhere classified   . Esophageal reflux   . Fundic gland polyps of stomach, benign   . GERD (gastroesophageal reflux disease)   . Hemorrhoids   . Hepatic cyst   . Hyperlipidemia   . Irritable bowel syndrome   . Migraine   . Numbness and tingling   . Pulmonary nodules   . Rosacea   . Syncope   . Unspecified constipation   . Unsteady gait    Lips and chin    Family History  Problem Relation Age of Onset  . Heart attack Mother   . Bladder Cancer Mother   . Heart disease Mother   . Bladder Cancer Father   . Heart disease Father   . Pancreatic cancer Paternal Grandfather   . Stomach cancer Paternal Grandfather   . Diabetes Paternal Grandmother   . Stroke Paternal Grandmother   . Colon cancer Neg Hx   . Esophageal cancer Neg Hx   . Rectal cancer Neg Hx     Past Surgical History:  Procedure Laterality Date  . ARTHRODESIS METATARSALPHALANGEAL JOINT (MTPJ) Right 12/17/2019   Procedure: ARTHRODESIS METATARSALPHALANGEAL JOINT (MTPJ) RIGHT GREAT TOE;  Surgeon: Newt Minion, MD;  Location: [place];  Service: Orthopedics;  Laterality: Right;  block in preop  . COLONOSCOPY  10/08/2010   Normal--multiple   . CYSTECTOMY    . DILATION AND CURETTAGE OF UTERUS    . ESOPHAGEAL MANOMETRY N/A 10/22/2012   Procedure: ESOPHAGEAL MANOMETRY (EM);  Surgeon: Sable Feil, MD;  Location: [facility];  Service: Endoscopy;  Laterality: N/A;  . ESOPHAGOGASTRODUODENOSCOPY  10/08/2010  . LAPAROSCOPY    . milk gland removal    . OOPHORECTOMY    . UPPER GASTROINTESTINAL ENDOSCOPY    . VAGINAL HYSTERECTOMY     Social History   Occupational  History  . 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 Smoking status: Former Smoker    Packs/day: 0.50    Years: 16.00    Pack years: 8.00    Types: Cigarettes    Quit date: 08/09/1987    Years since quitting: 32.7  . Smokeless tobacco: Never Used  Vaping Use  . Vaping Use: Never used  Substance and Sexual Activity  . Alcohol use: Yes    Alcohol/week:  0.0 standard drinks    Comment: Occasionally  . Drug use: No  . Sexual activity: Not on file

## 2020-06-08 ENCOUNTER — Telehealth: Payer: Self-pay | Admitting: Internal Medicine

## 2020-06-08 NOTE — Telephone Encounter (Signed)
Pt is requesting a refill on the pt's pantoprazole and bentyl 90 day supply

## 2020-06-08 NOTE — Telephone Encounter (Signed)
Yes, okay to refill dicyclomine

## 2020-06-08 NOTE — Telephone Encounter (Signed)
Dr Hilarie Fredrickson- Patient requesting a refill on both pantoprazole and dicyclomine. I can give a refill on pantoprazole. However, she has not received dicyclomine since 2017 and that was given by Nicoletta Ba, PA-C. Okay to fill some under you?

## 2020-06-09 MED ORDER — PANTOPRAZOLE SODIUM 40 MG PO TBEC: 40.0000 mg | DELAYED_RELEASE_TABLET | Freq: Two times a day (BID) | ORAL | 1 refills | Status: DC

## 2020-06-09 MED ORDER — DICYCLOMINE HCL 10 MG PO CAPS: ORAL_CAPSULE | ORAL | 3 refills | Status: AC

## 2020-06-09 NOTE — Telephone Encounter (Signed)
Rx for both pantoprazole and dicylomine sent for patient.

## 2020-12-12 ENCOUNTER — Other Ambulatory Visit: Payer: Self-pay | Admitting: Internal Medicine

## 2021-07-05 ENCOUNTER — Other Ambulatory Visit: Payer: Self-pay | Admitting: Internal Medicine

## 2021-12-09 ENCOUNTER — Encounter: Payer: Self-pay | Admitting: Cardiovascular Disease

## 2021-12-09 NOTE — Progress Notes (Signed)
?Cardiology Office Note:   ? ?Date:  12/10/2021  ? ?ID:  [name], DOB [birth year], MRN [id number] ? ?PCP:  Tempie Hoist, FNP  ?Cardiologist:  Mertie Moores, MD   ? ?Referring MD: [name], MD  ? ?Problem List ?1.  Hyperlipidemia ?2.  Left arm pain  ?3.  Palpitations ? ? ?Chief Complaint  ?Patient presents with  ? Chest Pain  ? Hyperlipidemia  ? ? ?  ? ?[name] is a 66 y.o. female with a hx of  Palpitations.  ?I saw [name] many years ago for palpitations ? ?She is seen back today for eval of some left arm pain .   ?Left arm pain radiated up to her neck and left jaw.   She took an ASA and the pain resolved after an hour.  ?Described as a left arm burning. ?Started when she was in bed.     Stinging pain in her left side of neck and jaw.  ?Associated with heart fluttering.  Was slightly short of breath and was lightheaded.  ? ?Does not get much exercise.   Sits at a desk at work .   ? ?Has hx of hyperlipidemia  ( last LDL was 188) .   Tried crestor - made her feel like she had the flu.  She has not tried any other statins since that time. ?Mother had a MI in her 72s.  ? ?Dec 25, 2017: ?[name]  is seen today for follow-up of her palpitations and episode of chest discomfort. ? ?Stress Myoview study on July 13, 2017 was normal.  She has no ischemia and has normal left ventricular systolic function. ?She had been having some leg discomfort. Dr. Gertie Fey ordered vascular studies   ankle brachial indexes were normal. ?Still eating more salt than she should . ?BP is a bit elevated.  ?No palpitation ?Lipids were a bit elevated.  - total chol 201,  LDL is 116. ?we ? ?March 10, 2020: ?[name]  is seen today for follow-up visit.  She has a history of palpitations and also chest discomfort.  myoview study  in December, 2018 was normal.  She was last seen over 2 years ago. ?No further episodes of CP , ?Has a migraine HR fo rhte past several days  ? ?Had foot surgery in May . ( Dr. Sharol Given)  Still not able to get out  much  ?BP looks good today  ?Still eating too much salt according to her  ? ? ?Dec 10, 2021: ?[name] is seen for follow up of her palpitations ?Hx of CP in the past ?Normal myoview in the past  ?Having issues with dry eyes.  Following cataract surgery ( made it worse )  ? ?BP is well controlled.  ?Tries to exercise regularly  ?Goes to the gym 2-3 times a week .  ? ?No recent palpitations  ? ?Chol = 309 ?Trigs 230 ?HDL - 58 ?LDL - 205 ?She just restarted her atorvastatin  ( was eating grapefruit so she stopped her atorvastatin )  ?+ family hx of CAD and HLD  ? ?Past Medical History:  ?Diagnosis Date  ? Adenomatous colon polyp   ? Allergy   ? SEASONAL  ? Anemia   ? hx of  ? Anxiety   ? Arthritis   ? BILATERAL HANDS,TOES ON RT. FOOT  ? Bladder disease   ? Cataract   ? Chronic interstitial cystitis   ? Depressive disorder, not elsewhere classified   ? Esophageal  reflux   ? Fundic gland polyps of stomach, benign   ? GERD (gastroesophageal reflux disease)   ? Hemorrhoids   ? Hepatic cyst   ? Hyperlipidemia   ? Irritable bowel syndrome   ? Migraine   ? Numbness and tingling   ? Pulmonary nodules   ? Rosacea   ? Syncope   ? Unspecified constipation   ? Unsteady gait   ? Lips and chin  ? ? ?Past Surgical History:  ?Procedure Laterality Date  ? ARTHRODESIS METATARSALPHALANGEAL JOINT (MTPJ) Right 12/17/2019  ? Procedure: ARTHRODESIS METATARSALPHALANGEAL JOINT (MTPJ) RIGHT GREAT TOE;  Surgeon: Newt Minion, MD;  Location: [place];  Service: Orthopedics;  Laterality: Right;  block in preop  ? COLONOSCOPY  10/08/2010  ? Normal--multiple   ? CYSTECTOMY    ? DILATION AND CURETTAGE OF UTERUS    ? ESOPHAGEAL MANOMETRY N/A 10/22/2012  ? Procedure: ESOPHAGEAL MANOMETRY (EM);  Surgeon: Sable Feil, MD;  Location: [facility];  Service: Endoscopy;  Laterality: N/A;  ? ESOPHAGOGASTRODUODENOSCOPY  10/08/2010  ? LAPAROSCOPY    ? milk gland removal    ? OOPHORECTOMY    ? UPPER GASTROINTESTINAL ENDOSCOPY    ? VAGINAL  HYSTERECTOMY    ? ? ?Current Medications: ?Current Meds  ?Medication Sig  ? AMBULATORY NON FORMULARY MEDICATION 1 tablet daily. Tarris Delbene's Colon Health  ? atorvastatin (LIPITOR) 10 MG tablet Take 1 tablet by mouth daily.  ? CALCIUM-VITAMIN D PO Take 1 tablet by mouth daily. Calcium '250mg'$  Vitamin d3 100 units magnesium '50mg'$   ? clonazePAM (KLONOPIN) 0.5 MG tablet Take 1 tablet (0.5 mg total) by mouth 2 (two) times daily as needed.  ? cyclobenzaprine (FLEXERIL) 10 MG tablet Take 1 tablet (10 mg total) by mouth at bedtime as needed for muscle spasms.  ? dicyclomine (BENTYL) 10 MG capsule Take 1 tab every 6 hours as needed for cramping, abdominal spasms.  ? doxycycline (VIBRAMYCIN) 50 MG capsule Take 1 capsule by mouth daily.  ? escitalopram (LEXAPRO) 20 MG tablet Take 20 mg by mouth daily.    ? meloxicam (MOBIC) 15 MG tablet Take 1 tablet by mouth as needed. Takes 7.5 mg  ? Multiple Vitamins-Minerals (MULTIVITAMIN WOMENS 50+ ADV PO) Take 1 tablet by mouth daily.  ? pantoprazole (PROTONIX) 40 MG tablet TAKE 1 TABLET BY MOUTH TWICE DAILY BEFORE A MEAL  ? SUMAtriptan (IMITREX) 100 MG tablet Take 1 tablet (100 mg total) by mouth every 2 (two) hours as needed for migraine. May repeat in 2 hours if headache persists or recurs.  ? Vitamin D, Cholecalciferol, 1000 units CAPS Take 5,000 Units by mouth daily.  ? ?Current Facility-Administered Medications for the 12/10/21 encounter (Office Visit) with Orit Sanville, Wonda Cheng, MD  ?Medication  ? 0.9 %  sodium chloride infusion  ?  ? ?Allergies:   Prednisone, Aspirin, and Topamax [topiramate]  ? ?Social History  ? ?Socioeconomic History  ? Marital status: Married  ?  Spouse name: Not on file  ? Number of children: 3  ? Years of education: 55  ? Highest education level: Not on file  ?Occupational History  ? 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  Employer: [employer]  ?Tobacco Use  ? Smoking status: Former  ?  Packs/day: 0.50  ?  Years: 16.00  ?  Pack years: 8.00  ?  Types:  Cigarettes  ?  Quit date: 08/09/1987  ?  Years since quitting: 34.3  ? Smokeless tobacco: Never  ?Vaping Use  ?  Vaping Use: Never used  ?Substance and Sexual Activity  ? Alcohol use: Yes  ?  Alcohol/week: 0.0 standard drinks  ?  Comment: Occasionally  ? Drug use: No  ? Sexual activity: Not on file  ?Other Topics Concern  ? Not on file  ?Social History Narrative  ? Lives at home with husband.  ? Right-handed.  ?  1 cup caffeine per day.  ? ?Social Determinants of Health  ? ?Financial Resource Strain: Not on file  ?Food Insecurity: Not on file  ?Transportation Needs: Not on file  ?Physical Activity: Not on file  ?Stress: Not on file  ?Social Connections: Not on file  ?  ? ?Family History: ?The patient's family history includes Bladder Cancer in her father and mother; Diabetes in her paternal grandmother; Heart attack in her mother; Heart disease in her father and mother; Pancreatic cancer in her paternal grandfather; Stomach cancer in her paternal grandfather; Stroke in her paternal grandmother. There is no history of Colon cancer, Esophageal cancer, or Rectal cancer. ?ROS:   ?Noted in current hx  ? ?EKGs/Labs/Other Studies Reviewed:   ? ?The following studies were reviewed today: ? ? ?EKG:    Dec 10, 2021:   NSR at 58.  No arrhtymias,  no ST abn  ? ?Recent Labs: ?No results found for requested labs within last 8760 hours.  ?Recent Lipid Panel ?   ?Component Value Date/Time  ? CHOL 201 (H) 10/16/2017 6720  ? TRIG 130 10/16/2017 0918  ? HDL 59 10/16/2017 0918  ? CHOLHDL 3.4 10/16/2017 0918  ? [place] 116 (H) 10/16/2017 9470  ? ? ?Physical Exam:   ? ?Physical Exam: ?Blood pressure 130/78, pulse 60, height '5\' 6"'$  (1.676 m), weight 161 lb (73 kg), SpO2 97 %. ? ?GEN:  Well nourished, well developed in no acute distress ?HEENT: Normal ?NECK: No JVD; No carotid bruits ?LYMPHATICS: No lymphadenopathy ?CARDIAC: RRR , no murmurs, rubs, gallops ?RESPIRATORY:  Clear to auscultation without rales, wheezing or rhonchi  ?ABDOMEN: Soft,  non-tender, non-distended ?MUSCULOSKELETAL:  No edema; No deformity  ?SKIN: Warm and dry ?NEUROLOGIC:  Alert and oriented x 3 ? ?ECG :    ? ?ASSESSMENT:   ? ?1. Mixed hyperlipidemia   ?2. Hypertension, unspecifie

## 2021-12-10 ENCOUNTER — Ambulatory Visit: Payer: Medicare PPO | Admitting: Cardiovascular Disease

## 2021-12-10 VITALS — BP 130/78 | HR 60 | Ht 66.0 in | Wt 161.0 lb

## 2021-12-10 DIAGNOSIS — E782 Mixed hyperlipidemia: Secondary | ICD-10-CM | POA: Diagnosis not present

## 2021-12-10 DIAGNOSIS — I10 Essential (primary) hypertension: Secondary | ICD-10-CM

## 2021-12-10 NOTE — Patient Instructions (Signed)
Medication Instructions:  ?Your physician recommends that you continue on your current medications as directed. Please refer to the Current Medication list given to you today. ? ?*If you need a refill on your cardiac medications before your next appointment, please call your pharmacy* ? ? ?Lab Work: ?Lipids, ALT in 3 months (may cancel if your PCP does these-send copies to [place]) ? ?If you have labs (blood work) drawn today and your tests are completely normal, you will receive your results only by: ?MyChart Message (if you have MyChart) OR ?A paper copy in the mail ?If you have any lab test that is abnormal or we need to change your treatment, we will call you to review the results. ? ? ?Testing/Procedures: ?Coronary Calcium score CT ? ? ?Follow-Up: ?At [facility], you and your health needs are our priority.  As part of our continuing mission to provide you with exceptional heart care, we have created designated Provider Care Teams.  These Care Teams include your primary Cardiologist (physician) and Advanced Practice Providers (APPs -  Physician Assistants and Nurse Practitioners) who all work together to provide you with the care you need, when you need it. ? ?Your next appointment:   ?1 year(s) ? ?The format for your next appointment:   ?In Person ? ?Provider:   ?Mertie Moores, MD  or Robbie Lis, PA-C, Christen Bame, NP, or Richardson Dopp, PA-C      ? ?  ? ?Important Information About Sugar ? ? ? ? ?  ?

## 2022-02-07 ENCOUNTER — Inpatient Hospital Stay: Admission: RE | Admit: 2022-02-07 | Payer: Medicare PPO | Source: Ambulatory Visit

## 2022-11-21 ENCOUNTER — Telehealth: Payer: Self-pay | Admitting: Internal Medicine

## 2022-11-21 ENCOUNTER — Other Ambulatory Visit: Payer: Self-pay

## 2022-11-21 MED ORDER — LUBIPROSTONE 24 MCG PO CAPS: 24.0000 ug | ORAL_CAPSULE | Freq: Two times a day (BID) | ORAL | 3 refills | Status: DC

## 2022-11-21 NOTE — Telephone Encounter (Signed)
Prescription sent to pharmacy and pt aware. 

## 2022-11-21 NOTE — Telephone Encounter (Signed)
Pt was taking amitiza bid.

## 2022-11-21 NOTE — Telephone Encounter (Signed)
Inbound call from patient requesting a call back from a nurse and also a medication for constipation.Please advise

## 2022-11-21 NOTE — Telephone Encounter (Signed)
Okay to retry Amitiza 24 mcg twice daily

## 2022-11-21 NOTE — Telephone Encounter (Signed)
Pt states she was taking amitiza but stopped and now she thinks she wants to try it again. She has been having to take a laxative and stool softeners to have a BM. Pt reports she cannot take miralax that it does not work for her. Please advise.

## 2022-12-29 ENCOUNTER — Ambulatory Visit: Payer: Medicare PPO | Admitting: Sports Medicine

## 2022-12-29 VITALS — BP 132/80 | Ht 65.0 in | Wt 155.0 lb

## 2022-12-29 DIAGNOSIS — M545 Low back pain, unspecified: Secondary | ICD-10-CM | POA: Diagnosis not present

## 2022-12-29 DIAGNOSIS — M25552 Pain in left hip: Secondary | ICD-10-CM | POA: Diagnosis not present

## 2022-12-29 DIAGNOSIS — M25551 Pain in right hip: Secondary | ICD-10-CM

## 2022-12-29 MED ORDER — KETOROLAC TROMETHAMINE 60 MG/2ML IM SOLN
60.0000 mg | Freq: Once | INTRAMUSCULAR | Status: AC
  Administered 2022-12-29: 60 mg via INTRAMUSCULAR

## 2022-12-29 MED ORDER — METHYLPREDNISOLONE ACETATE 40 MG/ML IJ SUSP
80.0000 mg | Freq: Once | INTRAMUSCULAR | Status: AC
  Administered 2022-12-29: 80 mg via INTRAMUSCULAR

## 2022-12-29 NOTE — Progress Notes (Signed)
Subjective:     Patient ID: [name], female   DOB: 1956-05-30, 67 y.o.   MRN: [id number]  HPI [name] is a 67 yo F with PMH significant for lumbar radiculopathy and bilateral greater trochanteric bursitis who is presenting to clinic with a 3 month history of progressively worsening low back and bilateral hip pain. She describes her back pain as a burning sensation that radiates to both hips, and says her pain is at a 7 on the pain scale. Movements such as bending forward exacerbate her symptoms while Meloxicam and Tylenol alleviate her pain, although she states that she prefers not to take medications. Her most recent L-spine MRI was in 2012 and revealed mild disc bulges in lower lumbar spine and mild facet and ligamentous prominence at L4-5.   The patient says that her hip pain is similar in nature and quality to the greater trochanteric bursitis that she experienced at previous visits to this office. She says the pain is worse in the left hip compared to the right, and wakes her up at night because she is a side-sleeper. The patient denies numbness or tingling in the legs.     Objective:   Physical Exam  General appearance - well appearing and in no acute distress.  Lumbar Spine - Sensation intact along entire spine and back. Tender to palpation along lumbar paraspinal muscles. Full extension. Flexion to 75 degrees, limited by pain. Negative straight raise leg test bilaterally.   Left Hip - Full flexion and extension bilaterally. 5/5 strength with flexion and extension. Tender to palpation directly over the greater trochanteric bursa. Patient endorses pain in groin with internal rotation.   Right Hip - Full flexion and extension bilaterally. 5/5 strength with flexion and extension. Tender to palpation directly over the greater trochanteric bursa, although less tender than left hip. Patient endorses pain in groin with internal rotation.     Assessment:     The most likely cause  of this patient's clinical presentation is likely multifactorial. The most likely leading cause of her pain is persistent lumbar radiculopathy. The patient previously  was seen in clinic for this diagnosis and symptoms improved after receiving an IM injection. History and physical exam findings are consistent with this diagnosis and we will inject her with 80 mg Depo-Medrol IM and 60 mg Toradol IM into the gluteus maximus for therapeutic relief. This diagnosis does not fully explain her symptoms, as she endorses pain in bilateral hips when laying on either side. Greater trochanteric bursitis of bilateral hips is likely another contributing factor to her pain. Physical exam findings of tenderness to palpation over greater trochanteric bursa bilaterally in addition to her past history of trochanteric bursitis is consistent with the diagnosis. Pain in the groin region with internal rotation of the hip is consistent with hip osteoarthritis, which is another potential component that is playing a role in her symptoms.     Plan:     Lower Back Pain:  - As mentioned above, will treat with injection of 80 mg Depo-Medrol IM and 60 mg Toradol IM into the gluteus maximus for therapeutic relief. - Provided patient with at-home physical therapy exercises for lower back stretching and strengthening, focusing on extension exercises as she endorses pain with flexion. - Continue taking Meloxicam 15 mg daily and Flexeril 10 mg daily as needed for pain - consider imaging of lumbar spine if pain persists  Bilateral Hip Pain:  - Injection mentioned above for lower back pain may  also alleviate some of her hip pain - Continue taking Meloxicam and Flexiril as needed for pain - if pain persists, consider X-rays to evaluate for osteoarthritis    [name], MS4  Patient seen and evaluated with the medical student.  I agree with the above plan of care.  Patient will follow-up for ongoing or recalcitrant issues.

## 2023-09-20 ENCOUNTER — Telehealth: Payer: Self-pay | Admitting: Cardiovascular Disease

## 2023-09-20 NOTE — Telephone Encounter (Signed)
   Name: [name]  DOB: [birth year]  MRN: [id number]  Primary Cardiologist: Kristeen Miss, MD  Chart reviewed as part of pre-operative protocol coverage. Because of Saranne F Zia's past medical history and time since last visit, she will require a follow-up in-office visit in order to better assess preoperative cardiovascular risk.  Pre-op covering staff: - Please schedule appointment and call patient to inform them. If patient already had an upcoming appointment within acceptable timeframe, please add "pre-op clearance" to the appointment notes so provider is aware. - Please contact requesting surgeon's office via preferred method (i.e, phone, fax) to inform them of need for appointment prior to surgery.     Napoleon Form, Leodis Rains, NP  09/20/2023, 10:46 AM

## 2023-09-20 NOTE — Telephone Encounter (Signed)
   Pre-operative Risk Assessment    Patient Name: [name]  DOB: [birth year] MRN: [id number]   Date of last office visit: 12/10/21 Date of next office visit: Not scheduled   Request for Surgical Clearance    Procedure:   Right thumb suspension arthoplasty  Date of Surgery:  Clearance 09/27/23                                Surgeon:  Dr. Mack Hook Surgeon's Group or Practice Name: [facility]  Phone number:  [phone]  Fax number:  [phone]   Type of Clearance Requested:   - Medical    Type of Anesthesia:  MAC   Additional requests/questions:   Caller Raynelle Fanning) stated patient will need medication clearance and anesthesia will be MAC and block.  Signed, Annetta Maw   09/20/2023, 10:36 AM

## 2023-09-20 NOTE — Telephone Encounter (Signed)
Lvm to call back and schedule in office visit

## 2023-09-21 NOTE — Telephone Encounter (Signed)
Pt has appt 09/22/23 with Azalee Course, PAC for preop clearance.   I will update all parries involved.

## 2023-09-22 ENCOUNTER — Ambulatory Visit: Payer: Medicare PPO | Attending: Physician Assistant | Admitting: Physician Assistant

## 2023-09-22 ENCOUNTER — Encounter: Payer: Self-pay | Admitting: Physician Assistant

## 2023-09-22 VITALS — BP 130/86 | HR 56 | Ht 65.0 in | Wt 155.6 lb

## 2023-09-22 DIAGNOSIS — Z01818 Encounter for other preprocedural examination: Secondary | ICD-10-CM | POA: Diagnosis not present

## 2023-09-22 NOTE — Telephone Encounter (Signed)
   Patient Name: [name]  DOB: 1955-09-16 MRN: [id number]  Primary Cardiologist: Kristeen Miss, MD  Chart reviewed as part of pre-operative protocol coverage. Given past medical history and time since last visit, based on ACC/AHA guidelines, [name] is at acceptable risk for the planned procedure without further cardiovascular testing.   The patient was advised that if she develops new symptoms prior to surgery to contact our office to arrange for a follow-up visit, and she verbalized understanding.  I will route this recommendation to the requesting party via Epic fax function and remove from pre-op pool.  Please call with questions.  [place], [place] 09/22/2023, 11:28 AM

## 2023-09-22 NOTE — Patient Instructions (Signed)
Medication Instructions:  NO CHANGES *If you need a refill on your cardiac medications before your next appointment, please call your pharmacy*   Lab Work: NO LABS If you have labs (blood work) drawn today and your tests are completely normal, you will receive your results only by: MyChart Message (if you have MyChart) OR A paper copy in the mail If you have any lab test that is abnormal or we need to change your treatment, we will call you to review the results.   Testing/Procedures: NO TESTING   Follow-Up: At [facility], you and your health needs are our priority.  As part of our continuing mission to provide you with exceptional heart care, we have created designated Provider Care Teams.  These Care Teams include your primary Cardiologist (physician) and Advanced Practice Providers (APPs -  Physician Assistants and Nurse Practitioners) who all work together to provide you with the care you need, when you need it.  Your next appointment:   6 month(s)  Provider:   Kristeen Miss, MD   Other Instructions Azalee Course, PA has cleared you for your procedure

## 2023-09-22 NOTE — Progress Notes (Signed)
  Cardiology Office Note:  .   Date:  09/22/2023  ID:  [name], DOB 1955-09-11, MRN [id number] PCP: Delorse Lek, FNP  [facility] Providers Cardiologist:  Kristeen Miss, MD    History of Present Illness: .   [name] is a 68 y.o. female with PMH of pallpitation and HLD. Echo 03/14/2011 showed EF 55-60%, grade 2 DD, systolic anterior motion of mitral valve. Myoview in 07/2017 was low risk. Patient was last seen by Dr. Elease Hashimoto in May 2023 at which time, she was doing well. A calcium scoring test was ordered at the time, however never done.   Patient presents today for preoperative prior to R thumb arthroplasty by Dr. Mack Hook. She denies any recent CP, SOB, LE edema, orthopnea or PND. She can clearly accomplish more than 4 METS of activity. She is at acceptable risk to proceed with thumb surgery. She can follow up in 6 month.   ROS:   She denies chest pain, palpitations, dyspnea, pnd, orthopnea, n, v, dizziness, syncope, edema, weight gain, or early satiety. All other systems reviewed and are otherwise negative except as noted above.    Studies Reviewed: .        Cardiac Studies & Procedures   ______________________________________________________________________________________________   STRESS TESTS  MYOCARDIAL PERFUSION IMAGING 07/13/2017  Narrative  Nuclear stress EF: 66%.  Blood pressure demonstrated a hypertensive response to exercise.  Upsloping ST segment depression ST segment depression of 1 mm was noted during stress in the V6 and V5 leads.  The study is normal.  This is a low risk study.  Low risk stress nuclear study with normal perfusion and normal left ventricular regional and global systolic function.            ______________________________________________________________________________________________      Risk Assessment/Calculations:            Physical Exam:   VS:  BP (!) 144/92 (BP Location: Left Arm, Patient  Position: Sitting)   Pulse (!) 56   Ht 5\' 5"  (1.651 m)   Wt 155 lb 9.6 oz (70.6 kg)   SpO2 94%   BMI 25.89 kg/m    Wt Readings from Last 3 Encounters:  09/22/23 155 lb 9.6 oz (70.6 kg)  12/29/22 155 lb (70.3 kg)  12/10/21 161 lb (73 kg)    GEN: Well nourished, well developed in no acute distress NECK: No JVD; No carotid bruits CARDIAC: RRR, no murmurs, rubs, gallops RESPIRATORY:  Clear to auscultation without rales, wheezing or rhonchi  ABDOMEN: Soft, non-tender, non-distended EXTREMITIES:  No edema; No deformity   ASSESSMENT AND PLAN: .     Preoperative Cardiac Clearance Patient with a history of palpitations and hyperlipidemia, but no history of coronary artery disease or heart attack. Normal stress test in 2018. Patient is active and independent, able to perform strenuous activities without issue. EKG normal. Patient is a low risk patient undergoing a low risk procedure (right thumb arthroplasty). -Cardiac clearance provided for right thumb arthroplasty on 09/27/2023. -Send clearance letter to Dr. Carollee Massed office.         Dispo: Follow-up in 6 months  Signed, Azalee Course, [place]

## 2024-07-12 NOTE — Progress Notes (Signed)
 Chief Complaint: LUQ pain, nausea, constipation Primary GI MD: Dr. Albertus  HPI: Discussed the use of AI scribe software for clinical note transcription with the patient, who gave verbal consent to proceed.  History of Present Illness   [name] is a 68 year old female who presents with left upper quadrant abdominal pain and nausea.  Last seen 02/2019 by Vina Dasen  She has been experiencing persistent left upper quadrant abdominal pain and nausea, sometimes radiating to her back. The pain has been ongoing for years but has worsened significantly this year, with unpredictable flare-ups.  She has a history of constipation, and experiences abdominal pain and nausea. Her bowel movements are infrequent and difficult, often requiring straining, with stools that are sometimes hard and pellet-like. She has tried magnesium supplements, Miralax , and fiber, but finds them either ineffective or slow to work. She uses magnesium help once or twice a week to aid bowel movements.  Her diet is limited due to pain associated with eating, primarily consisting of instant mashed potatoes and cheese sandwiches. She has been on Protonix  40 mg once daily for GERD, increasing to twice daily during flare-ups, though it only helps with GERD and not with her abdominal pain or nausea.  She recalls a CT scan in 2015 that showed significant constipation, and she has had a normal endoscopy and colonoscopy in the past. She mentions a history of a thumb joint replacement in February, after which her symptoms worsened.  She has experienced significant life stressors, including her husband's back injury and her daughter-in-law's paralysis, which she believes may have exacerbated her symptoms. She reports bloating and increased gas over the past few months, which she attributes to her constipation.  She has tried various medications for constipation, including amitiza  and Linzess , but discontinued them due to side  effects such as headaches, nausea, and flu-like symptoms. She uses dicyclomine  as needed for pain, which she finds effective.  She is a big coffee drinker but has reduced her intake due to worsening symptoms. She occasionally uses energy drinks to stimulate bowel movements. No diarrhea.   PREVIOUS GI WORKUP   EGD 03/2020 - Normal esophagus.  - Normal stomach.  - Normal examined duodenum.  - No specimens collected.  Colonoscopy 03/2020 - One 3 mm polyp in the cecum, removed with a cold snare. Resected and retrieved.  - One 6 mm polyp in the ascending colon, removed with a cold snare. Resected and retrieved.  - One 5 mm polyp in the descending colon, removed with a cold snare. Resected and retrieved. - Small lipoma at the hepatic flexure.  - Melanosis in the colon.  - The distal rectum and anal verge are normal on retroflexion view. -Biopsies tubular adenomas - Repeat 5 years (03/2025)  Past Medical History:  Diagnosis Date   Adenomatous colon polyp    Allergy    SEASONAL   Anemia    hx of   Anxiety    Arthritis    BILATERAL HANDS,TOES ON RT. FOOT   Bladder disease    Cataract    Chronic interstitial cystitis    Depressive disorder, not elsewhere classified    Esophageal reflux    Fundic gland polyps of stomach, benign    GERD (gastroesophageal reflux disease)    Hemorrhoids    Hepatic cyst    Hyperlipidemia    Irritable bowel syndrome    Migraine    Numbness and tingling    Pulmonary nodules    Rosacea  Syncope    Unspecified constipation    Unsteady gait    Lips and chin    Past Surgical History:  Procedure Laterality Date   ARTHRODESIS METATARSALPHALANGEAL JOINT (MTPJ) Right 12/17/2019   Procedure: ARTHRODESIS METATARSALPHALANGEAL JOINT (MTPJ) RIGHT GREAT TOE;  Surgeon: Harden Jerona GAILS, MD;  [facility]:  [facility];  Service: Orthopedics;  Laterality: Right;  block in preop   COLONOSCOPY  10/08/2010   Normal--multiple    CYSTECTOMY     DILATION AND  CURETTAGE OF UTERUS     ESOPHAGEAL MANOMETRY N/A 10/22/2012   Procedure: ESOPHAGEAL MANOMETRY (EM);  Surgeon: Alm JONELLE Gander, MD;  Location: [facility];  Service: Endoscopy;  Laterality: N/A;   ESOPHAGOGASTRODUODENOSCOPY  10/08/2010   LAPAROSCOPY     milk gland removal     OOPHORECTOMY     UPPER GASTROINTESTINAL ENDOSCOPY     VAGINAL HYSTERECTOMY      Current Outpatient Medications  Medication Sig Dispense Refill   AMBULATORY NON FORMULARY MEDICATION 1 tablet daily. [facility]     CALCIUM -VITAMIN D  PO Take 1 tablet by mouth daily. Calcium  250mg  Vitamin d3 100 units magnesium 50mg      clonazePAM  (KLONOPIN ) 0.5 MG tablet Take 1 tablet (0.5 mg total) by mouth 2 (two) times daily as needed. 180 tablet 2   cyclobenzaprine  (FLEXERIL ) 10 MG tablet Take 1 tablet (10 mg total) by mouth at bedtime as needed for muscle spasms. 20 tablet 0   dicyclomine  (BENTYL ) 10 MG capsule Take 1 tab every 6 hours as needed for cramping, abdominal spasms. 40 capsule 3   escitalopram (LEXAPRO) 20 MG tablet Take 20 mg by mouth daily.       estradiol (ESTRACE) 0.1 MG/GM vaginal cream Place 1 Applicatorful vaginally 2 (two) times a week.     meloxicam  (MOBIC ) 15 MG tablet Take 1 tablet by mouth as needed. Takes 7.5 mg     meloxicam  (MOBIC ) 7.5 MG tablet Take 1 tablet by mouth 2 (two) times daily with a meal.     metroNIDAZOLE (METROCREAM) 0.75 % cream Apply 1 Application topically 2 (two) times daily.     minocycline (DYNACIN) 50 MG tablet Take 50 mg by mouth 2 (two) times daily.     nortriptyline  (PAMELOR ) 10 MG capsule Take 2 capsules (20 mg total) by mouth at bedtime. 180 capsule 3   pantoprazole  (PROTONIX ) 40 MG tablet TAKE 1 TABLET BY MOUTH TWICE DAILY BEFORE A MEAL 180 tablet 0   SUMAtriptan  (IMITREX ) 100 MG tablet Take 1 tablet (100 mg total) by mouth every 2 (two) hours as needed for migraine. May repeat in 2 hours if headache persists or recurs. 30 tablet 3   Tenapanor HCl (IBSRELA ) 50 MG TABS  Take 50 mg by mouth 2 (two) times daily before a meal. 24 tablet 0   Vitamin D , Cholecalciferol, 1000 units CAPS Take 5,000 Units by mouth daily.     atorvastatin  (LIPITOR) 10 MG tablet Take 1 tablet by mouth daily. (Patient not taking: Reported on 07/15/2024)     doxycycline (VIBRAMYCIN) 50 MG capsule Take 1 capsule by mouth daily. (Patient not taking: Reported on 07/15/2024)     lubiprostone  (AMITIZA ) 24 MCG capsule Take 1 capsule (24 mcg total) by mouth 2 (two) times daily with a meal. (Patient not taking: Reported on 07/15/2024) 60 capsule 3   Multiple Vitamins-Minerals (MULTIVITAMIN WOMENS 50+ ADV PO) Take 1 tablet by mouth daily. (Patient not taking: Reported on 07/15/2024)     Current Facility-Administered Medications  Medication Dose Route Frequency Provider Last Rate Last Admin   0.9 %  sodium chloride  infusion  500 mL Intravenous Continuous Pyrtle, Gordy HERO, MD        Allergies as of 07/15/2024 - Review Complete 07/15/2024  Allergen Reaction Noted   Prednisone Other (See Comments) 06/27/2016   Aspirin Nausea Only 09/08/2009   Topamax  [topiramate ] Rash 06/20/2016    Family History  Problem Relation Age of Onset   Heart attack Mother    Bladder Cancer Mother    Heart disease Mother    Bladder Cancer Father    Heart disease Father    Pancreatic cancer Paternal Grandfather    Stomach cancer Paternal Grandfather    Diabetes Paternal Grandmother    Stroke Paternal Grandmother    Colon cancer Neg Hx    Esophageal cancer Neg Hx    Rectal cancer Neg Hx     Social History   Socioeconomic History   Marital status: Married    Spouse name: Not on file   Number of children: 3   Years of education: 14   Highest education level: Not on file  Occupational History   Occupation: solicitor of social services    Employer: [employer]  Tobacco Use   Smoking status: Former    Current packs/day: 0.00    Average packs/day: 0.5 packs/day for 16.0 years (8.0 ttl pk-yrs)    Types:  Cigarettes    Start date: 08/09/1971    Quit date: 08/09/1987    Years since quitting: 36.9   Smokeless tobacco: Never  Vaping Use   Vaping status: Never Used  Substance and Sexual Activity   Alcohol use: Yes    Alcohol/week: 0.0 standard drinks of alcohol    Comment: Occasionally   Drug use: No   Sexual activity: Not on file  Other Topics Concern   Not on file  Social History Narrative   Lives at home with husband.   Right-handed.    1 cup caffeine per day.   Social Drivers of Corporate Investment Banker Strain: Not on file  Food Insecurity: Not on file  Transportation Needs: Not on file  Physical Activity: Not on file  Stress: Not on file  Social Connections: Not on file  Intimate Partner Violence: Not on file    Review of Systems:    Constitutional: No weight loss, fever, chills, weakness or fatigue HEENT: Eyes: No change in vision               Ears, Nose, Throat:  No change in hearing or congestion Skin: No rash or itching Cardiovascular: No chest pain, chest pressure or palpitations   Respiratory: No SOB or cough Gastrointestinal: See HPI and otherwise negative Genitourinary: No dysuria or change in urinary frequency Neurological: No headache, dizziness or syncope Musculoskeletal: No new muscle or joint pain Hematologic: No bleeding or bruising Psychiatric: No history of depression or anxiety    Physical Exam:  Vital signs: BP 122/70   Pulse (!) 57   Ht 5' 5 (1.651 m)   Wt 155 lb (70.3 kg)   BMI 25.79 kg/m   Constitutional: NAD, alert and cooperative Head:  Normocephalic and atraumatic. Eyes:   PEERL, EOMI. No icterus. Conjunctiva pink. Respiratory: Respirations even and unlabored. Lungs clear to auscultation bilaterally.   No wheezes, crackles, or rhonchi.  Cardiovascular:  Regular rate and rhythm. No peripheral edema, cyanosis or pallor.  Gastrointestinal:  Soft, nondistended, nontender. No rebound or guarding. Normal bowel sounds. No  appreciable  masses or hepatomegaly. Rectal:  Declines Msk:  Symmetrical without gross deformities. Without edema, no deformity or joint abnormality.  Neurologic:  Alert and  oriented x4;  grossly normal neurologically.  Skin:   Dry and intact without significant lesions or rashes. Psychiatric: Oriented to person, place and time. Demonstrates good judgement and reason without abnormal affect or behaviors.  Physical Exam    RELEVANT LABS AND IMAGING: CBC    Component Value Date/Time   WBC 6.9 04/19/2016 0947   WBC 6.4 08/25/2014 1052   RBC 4.96 04/19/2016 0947   RBC 4.62 08/25/2014 1052   HGB 15.1 04/19/2016 0947   HCT 45.2 04/19/2016 0947   PLT 216 04/19/2016 0947   MCV 91.2 04/19/2016 0947   MCH 30.5 04/19/2016 0947   MCHC 33.5 04/19/2016 0947   MCHC 31.2 08/25/2014 1052   RDW 13.0 04/19/2016 0947   LYMPHSABS 1.8 04/19/2016 0947   MONOABS 0.5 04/19/2016 0947   EOSABS 0.1 04/19/2016 0947   BASOSABS 0.1 04/19/2016 0947    CMP     Component Value Date/Time   NA 138 04/22/2015 0809   K 4.5 04/22/2015 0809   CL 100 04/16/2014 1037   CO2 28 04/22/2015 0809   GLUCOSE 99 04/22/2015 0809   BUN 10.8 04/22/2015 0809   CREATININE 0.8 04/22/2015 0809   CALCIUM  10.7 (H) 04/22/2015 0809   PROT 6.7 10/16/2017 0918   PROT 6.9 04/22/2015 0809   ALBUMIN 4.4 10/16/2017 0918   ALBUMIN 4.0 04/22/2015 0809   AST 25 10/16/2017 0918   AST 17 04/22/2015 0809   ALT 30 10/16/2017 0918   ALT 17 04/22/2015 0809   ALKPHOS 92 10/16/2017 0918   ALKPHOS 100 04/22/2015 0809   BILITOT 0.3 10/16/2017 0918   BILITOT 0.42 04/22/2015 0809   GFRNONAA 92.12 10/06/2009 1007   GFRAA  10/29/2008 0614    >60        The eGFR has been calculated using the MDRD equation. This calculation has not been validated in all clinical situations. eGFR's persistently <60 mL/min signify possible Chronic Kidney Disease.     Assessment/Plan:   GERD Normal EGD 03/2020.  On Protonix  40 mg once daily.  Adequate  control. - Continue Protonix  40 mg once daily - Educated patient on lifestyle modifications monthly patient education handouts  LUQ pain, nausea, chronic constipation CT abdomen pelvis with contrast 2015 with the above showed significant stool burden.  Notes LLQ pain and nausea during worsening constipation.  Frequent hard stools that are pebble-like.  Not on a bowel regimen at this time.  Failed linzess  (flu symptoms), amitiza  too expensive.  Has not been consistent with MiraLAX .  Up-to-date on colonoscopy. - Trial MiraLAX  1 capful daily adjust as based on response and added Benefiber 1 to 2 tablespoons daily - If no improvement trial of Ibsrela  (samples provided) - Continue dicyclomine  as needed - Follow-up 6 to 8 weeks - If continued symptoms consider repeat CT scan  History of colon polyps 3 tubular adenomas on colonoscopy 03/2020 with a 5-year recall - Repeat 03/2025  PVD   [name] [name] [facility] 07/15/2024, 12:02 PM  Cc: Blair, Diane W, FNP

## 2024-07-15 ENCOUNTER — Encounter: Payer: Self-pay | Admitting: Gastroenterology

## 2024-07-15 ENCOUNTER — Ambulatory Visit: Admitting: Gastroenterology

## 2024-07-15 VITALS — BP 122/70 | HR 57 | Ht 65.0 in | Wt 155.0 lb

## 2024-07-15 DIAGNOSIS — R1012 Left upper quadrant pain: Secondary | ICD-10-CM

## 2024-07-15 DIAGNOSIS — Z860100 Personal history of colon polyps, unspecified: Secondary | ICD-10-CM

## 2024-07-15 DIAGNOSIS — K581 Irritable bowel syndrome with constipation: Secondary | ICD-10-CM

## 2024-07-15 DIAGNOSIS — K219 Gastro-esophageal reflux disease without esophagitis: Secondary | ICD-10-CM

## 2024-07-15 DIAGNOSIS — R110 Nausea: Secondary | ICD-10-CM

## 2024-07-15 MED ORDER — IBSRELA 50 MG PO TABS: 50.0000 mg | ORAL_TABLET | Freq: Two times a day (BID) | ORAL | 0 refills | Status: AC

## 2024-07-15 NOTE — Patient Instructions (Addendum)
 Start taking Miralax  1 capful (17 grams) 1x / day for 1 week.   If this is not effective, increase to 1 dose 2x / day for 1 week.   If this is still not effective, increase to two capfuls (34 grams) 2x / day.   Can adjust dose as needed based on response. Can take 1/2 cap daily, skip days, or increase per day.    A high fiber diet with plenty of fluids (up to 8 glasses of water daily) is suggested to relieve these symptoms.  Benefiber, 1 tablespoon once or twice daily can be used to keep bowels regular if needed.   Abdominal bloating and discomfort may be due to intestinal sensitivity or symptoms of irritable bowel syndrome. To relieve symptoms, avoid:  Broccoli  Baked beans  Cabbage  Carbonated drinks  Cauliflower  Chewing gum  Hard candy Abdominal distention resulting from weak abdominal muscles:  Is better in the morning  Gets worse as the day progresses  Is relieved by lying down Flatulence is gas created through bacterial action in the bowel and passed rectally. Keep in mind that:  10-18 passages per day are normal  Primary gases are harmless and odorless  Noticeable smells are trace gases related to food intake Foods to AVOID that are likely to form gas include:  Milk, dairy products, and medications that contain lactose--If your body doesn't produce the enzyme (lactase) to break it down.  Certain vegetables--baked beans, cauliflower, broccoli, cabbage  Certain starches--wheat, oats, corn, potatoes. Rice is a good substitute. Identify offending foods. Reduce or eliminate these gas-forming foods from your diet. Can look at the FODMAP diet.   We have given you samples of the following medication to take: Ibsrella 50 mg  _______________________________________________________  If your blood pressure at your visit was 140/90 or greater, please contact your primary care physician to follow up on this.  _______________________________________________________  If you are age 13  or older, your body mass index should be between 23-30. Your Body mass index is 25.79 kg/m. If this is out of the aforementioned range listed, please consider follow up with your Primary Care Provider.  If you are age 37 or younger, your body mass index should be between 19-25. Your Body mass index is 25.79 kg/m. If this is out of the aformentioned range listed, please consider follow up with your Primary Care Provider.   ________________________________________________________  The [facility] providers would like to encourage you to use MYCHART to communicate with providers for non-urgent requests or questions.  Due to long hold times on the telephone, sending your provider a message by [facility] may be a faster and more efficient way to get a response.  Please allow 48 business hours for a response.  Please remember that this is for non-urgent requests.  _______________________________________________________  [facility] is using a team-based approach to care.  Your team is made up of your doctor and two to three APPS. Our APPS (Nurse Practitioners and Physician Assistants) work with your physician to ensure care continuity for you. They are fully qualified to address your health concerns and develop a treatment plan. They communicate directly with your gastroenterologist to care for you. Seeing the Advanced Practice Practitioners on your physician's team can help you by facilitating care more promptly, often allowing for earlier appointments, access to diagnostic testing, procedures, and other specialty referrals.

## 2024-08-26 ENCOUNTER — Ambulatory Visit: Admitting: Gastroenterology

## 2024-08-26 ENCOUNTER — Encounter: Payer: Self-pay | Admitting: Gastroenterology

## 2024-08-26 VITALS — BP 116/78 | HR 55 | Ht 65.0 in | Wt 155.2 lb

## 2024-08-26 DIAGNOSIS — Z860101 Personal history of adenomatous and serrated colon polyps: Secondary | ICD-10-CM

## 2024-08-26 DIAGNOSIS — Z860100 Personal history of colon polyps, unspecified: Secondary | ICD-10-CM

## 2024-08-26 DIAGNOSIS — K219 Gastro-esophageal reflux disease without esophagitis: Secondary | ICD-10-CM

## 2024-08-26 DIAGNOSIS — R110 Nausea: Secondary | ICD-10-CM | POA: Diagnosis not present

## 2024-08-26 DIAGNOSIS — K5909 Other constipation: Secondary | ICD-10-CM

## 2024-08-26 DIAGNOSIS — R1012 Left upper quadrant pain: Secondary | ICD-10-CM | POA: Diagnosis not present

## 2024-08-26 DIAGNOSIS — K581 Irritable bowel syndrome with constipation: Secondary | ICD-10-CM

## 2024-08-26 NOTE — Progress Notes (Signed)
 "  Chief Complaint: Chronic constipation follow-up Primary GI MD: Dr. Albertus  HPI: Discussed the use of AI scribe software for clinical note transcription with the patient, who gave verbal consent to proceed.  History of Present Illness   [name] is a 69 year old female with chronic constipation who presents for follow-up of her bowel regimen.  Chronic constipation has been managed with Miralax  and previously with Ibsrela . Ibsrela  was highly effective but caused excessive effect at twice daily dosing and is not used regularly due to lack of insurance coverage. She used Ibsrela  intermittently, particularly after consuming foods that trigger constipation, such as cooked turnip greens.  Currently, Miralax  is taken at one capful per day, and the patient reports that it is working pretty well for her bowel movements. She has asked about increasing the dose to a cap and a half if her diet is suboptimal or after consuming constipating foods. Fiber supplements are not used, as Miralax  has been sufficient. Bowel movements are generally well controlled, with occasional episodes of constipation lasting up to five days after eating foods like cheese or cooked turnip greens. Dietary indiscretions, such as eating mostly bread due to a migraine, can result in a missed bowel movement for a day, but normal function resumes with improved diet and Miralax  use.  Bloating and gas are present but have improved significantly on the current regimen. She feels lighter and less bloated overall, with formed bowel movements. No significant adverse effects from Miralax  or Ibsrela  have been reported.   EGD 03/2020 - Normal esophagus.  - Normal stomach.  - Normal examined duodenum.  - No specimens collected.   Colonoscopy 03/2020 - One 3 mm polyp in the cecum, removed with a cold snare. Resected and retrieved.  - One 6 mm polyp in the ascending colon, removed with a cold snare. Resected and retrieved.  - One 5 mm  polyp in the descending colon, removed with a cold snare. Resected and retrieved. - Small lipoma at the hepatic flexure.  - Melanosis in the colon.  - The distal rectum and anal verge are normal on retroflexion view. -Biopsies tubular adenomas - Repeat 5 years (03/2025)    Past Medical History:  Diagnosis Date   Adenomatous colon polyp    Allergy    SEASONAL   Anemia    hx of   Anxiety    Arthritis    BILATERAL HANDS,TOES ON RT. FOOT   Bladder disease    Cataract    Chronic interstitial cystitis    Depressive disorder, not elsewhere classified    Esophageal reflux    Fundic gland polyps of stomach, benign    GERD (gastroesophageal reflux disease)    Hemorrhoids    Hepatic cyst    Hyperlipidemia    Irritable bowel syndrome    Migraine    Numbness and tingling    Pulmonary nodules    Rosacea    Syncope    Unspecified constipation    Unsteady gait    Lips and chin    Past Surgical History:  Procedure Laterality Date   ARTHRODESIS METATARSALPHALANGEAL JOINT (MTPJ) Right 12/17/2019   Procedure: ARTHRODESIS METATARSALPHALANGEAL JOINT (MTPJ) RIGHT GREAT TOE;  Surgeon: Harden Jerona GAILS, MD;  Location: [facility];  Service: Orthopedics;  Laterality: Right;  block in preop   COLONOSCOPY  10/08/2010   Normal--multiple    CYSTECTOMY     DILATION AND CURETTAGE OF UTERUS     ESOPHAGEAL MANOMETRY N/A 10/22/2012  Procedure: ESOPHAGEAL MANOMETRY (EM);  Surgeon: Alm JONELLE Gander, MD;  Location: [facility];  Service: Endoscopy;  Laterality: N/A;   ESOPHAGOGASTRODUODENOSCOPY  10/08/2010   LAPAROSCOPY     milk gland removal     OOPHORECTOMY     UPPER GASTROINTESTINAL ENDOSCOPY     VAGINAL HYSTERECTOMY      Current Outpatient Medications  Medication Sig Dispense Refill   CALCIUM -VITAMIN D  PO Take 1 tablet by mouth daily. Calcium  250mg  Vitamin d3 100 units magnesium 50mg      clonazePAM  (KLONOPIN ) 0.5 MG tablet Take 1 tablet (0.5 mg total) by mouth 2 (two) times daily as  needed. 180 tablet 2   cyclobenzaprine  (FLEXERIL ) 10 MG tablet Take 1 tablet (10 mg total) by mouth at bedtime as needed for muscle spasms. 20 tablet 0   dicyclomine  (BENTYL ) 10 MG capsule Take 1 tab every 6 hours as needed for cramping, abdominal spasms. 40 capsule 3   escitalopram (LEXAPRO) 20 MG tablet Take 20 mg by mouth daily.       estradiol (ESTRACE) 0.1 MG/GM vaginal cream Place 1 Applicatorful vaginally 2 (two) times a week.     meloxicam  (MOBIC ) 7.5 MG tablet Take 1 tablet by mouth 2 (two) times daily with a meal.     metroNIDAZOLE (METROCREAM) 0.75 % cream Apply 1 Application topically 2 (two) times daily.     minocycline (DYNACIN) 50 MG tablet Take 50 mg by mouth 2 (two) times daily.     Multiple Vitamins-Minerals (MULTIVITAMIN WOMENS 50+ ADV PO) Take 1 tablet by mouth daily.     pantoprazole  (PROTONIX ) 40 MG tablet TAKE 1 TABLET BY MOUTH TWICE DAILY BEFORE A MEAL 180 tablet 0   Probiotic Product (PROBIOTIC ADVANCED PO) Take by mouth.     SUMAtriptan  (IMITREX ) 100 MG tablet Take 1 tablet (100 mg total) by mouth every 2 (two) hours as needed for migraine. May repeat in 2 hours if headache persists or recurs. 30 tablet 3   Tenapanor HCl (IBSRELA ) 50 MG TABS Take 50 mg by mouth 2 (two) times daily before a meal. 24 tablet 0   Vitamin D , Cholecalciferol, 1000 units CAPS Take 5,000 Units by mouth daily.     nortriptyline  (PAMELOR ) 10 MG capsule Take 2 capsules (20 mg total) by mouth at bedtime. (Patient not taking: Reported on 08/26/2024) 180 capsule 3   Current Facility-Administered Medications  Medication Dose Route Frequency Provider Last Rate Last Admin   0.9 %  sodium chloride  infusion  500 mL Intravenous Continuous Pyrtle, Gordy HERO, MD        Allergies as of 08/26/2024 - Review Complete 08/26/2024  Allergen Reaction Noted   Prednisone Other (See Comments) 06/27/2016   Aspirin Nausea Only and Other (See Comments) 09/08/2009   Topiramate  Rash, Dermatitis, and Other (See Comments)  06/20/2016    Family History  Problem Relation Age of Onset   Heart attack Mother    Bladder Cancer Mother    Heart disease Mother    Bladder Cancer Father    Heart disease Father    Pancreatic cancer Paternal Grandfather    Stomach cancer Paternal Grandfather    Diabetes Paternal Grandmother    Stroke Paternal Grandmother    Colon cancer Neg Hx    Esophageal cancer Neg Hx    Rectal cancer Neg Hx     Social History   Socioeconomic History   Marital status: Married    Spouse name: Not on file   Number of children: 3   Years of  education: 14   Highest education level: Not on file  Occupational History   Occupation: clerk of social services    Employer: [employer]  Tobacco Use   Smoking status: Former    Current packs/day: 0.00    Average packs/day: 0.5 packs/day for 16.0 years (8.0 ttl pk-yrs)    Types: Cigarettes    Start date: 08/09/1971    Quit date: 08/09/1987    Years since quitting: 37.0   Smokeless tobacco: Never  Vaping Use   Vaping status: Never Used  Substance and Sexual Activity   Alcohol use: Yes    Alcohol/week: 0.0 standard drinks of alcohol    Comment: Occasionally   Drug use: No   Sexual activity: Not on file  Other Topics Concern   Not on file  Social History Narrative   Lives at home with husband.   Right-handed.    1 cup caffeine per day.   Social Drivers of Health   Tobacco Use: Medium Risk (08/26/2024)   Patient History    Smoking Tobacco Use: Former    Smokeless Tobacco Use: Never    Passive Exposure: Not on Actuary Strain: Not on file  Food Insecurity: Not on file  Transportation Needs: Not on file  Physical Activity: Not on file  Stress: Not on file  Social Connections: Not on file  Intimate Partner Violence: Not on file  Depression (PHQ2-9): Not on file  Alcohol Screen: Not on file  Housing: Not on file  Utilities: Not on file  Health Literacy: Not on file    Review of Systems:     Constitutional: No weight loss, fever, chills, weakness or fatigue HEENT: Eyes: No change in vision               Ears, Nose, Throat:  No change in hearing or congestion Skin: No rash or itching Cardiovascular: No chest pain, chest pressure or palpitations   Respiratory: No SOB or cough Gastrointestinal: See HPI and otherwise negative Genitourinary: No dysuria or change in urinary frequency Neurological: No headache, dizziness or syncope Musculoskeletal: No new muscle or joint pain Hematologic: No bleeding or bruising Psychiatric: No history of depression or anxiety    Physical Exam:  Vital signs: BP 116/78   Pulse (!) 55   Ht 5' 5 (1.651 m)   Wt 155 lb 3.2 oz (70.4 kg)   BMI 25.83 kg/m   Constitutional: NAD, alert and cooperative Head:  Normocephalic and atraumatic. Eyes:   PEERL, EOMI. No icterus. Conjunctiva pink. Respiratory: Respirations even and unlabored. Lungs clear to auscultation bilaterally.   No wheezes, crackles, or rhonchi.  Cardiovascular:  Regular rate and rhythm. No peripheral edema, cyanosis or pallor.  Gastrointestinal:  Soft, nondistended, nontender. No rebound or guarding. Normal bowel sounds. No appreciable masses or hepatomegaly. Rectal:  Declines Msk:  Symmetrical without gross deformities. Without edema, no deformity or joint abnormality.  Neurologic:  Alert and  oriented x4;  grossly normal neurologically.  Skin:   Dry and intact without significant lesions or rashes. Psychiatric: Oriented to person, place and time. Demonstrates good judgement and reason without abnormal affect or behaviors.  Physical Exam    RELEVANT LABS AND IMAGING: CBC    Component Value Date/Time   WBC 6.9 04/19/2016 0947   WBC 6.4 08/25/2014 1052   RBC 4.96 04/19/2016 0947   RBC 4.62 08/25/2014 1052   HGB 15.1 04/19/2016 0947   HCT 45.2 04/19/2016 0947   PLT 216 04/19/2016 0947  MCV 91.2 04/19/2016 0947   MCH 30.5 04/19/2016 0947   MCHC 33.5 04/19/2016 0947    MCHC 31.2 08/25/2014 1052   RDW 13.0 04/19/2016 0947   LYMPHSABS 1.8 04/19/2016 0947   MONOABS 0.5 04/19/2016 0947   EOSABS 0.1 04/19/2016 0947   BASOSABS 0.1 04/19/2016 0947    CMP     Component Value Date/Time   NA 138 04/22/2015 0809   K 4.5 04/22/2015 0809   CL 100 04/16/2014 1037   CO2 28 04/22/2015 0809   GLUCOSE 99 04/22/2015 0809   BUN 10.8 04/22/2015 0809   CREATININE 0.8 04/22/2015 0809   CALCIUM  10.7 (H) 04/22/2015 0809   PROT 6.7 10/16/2017 0918   PROT 6.9 04/22/2015 0809   ALBUMIN 4.4 10/16/2017 0918   ALBUMIN 4.0 04/22/2015 0809   AST 25 10/16/2017 0918   AST 17 04/22/2015 0809   ALT 30 10/16/2017 0918   ALT 17 04/22/2015 0809   ALKPHOS 92 10/16/2017 0918   ALKPHOS 100 04/22/2015 0809   BILITOT 0.3 10/16/2017 0918   BILITOT 0.42 04/22/2015 0809   GFRNONAA 92.12 10/06/2009 1007   GFRAA  10/29/2008 0614    >60        The eGFR has been calculated using the MDRD equation. This calculation has not been validated in all clinical situations. eGFR's persistently <60 mL/min signify possible Chronic Kidney Disease.     Assessment/Plan:   GERD Normal EGD 03/2020.  On Protonix  40 mg once daily.  Adequate control. - Continue Protonix  40 mg once daily - Educated patient on lifestyle modifications monthly patient education handouts   LUQ pain, nausea, chronic constipation Failed linzess  (flu symptoms), amitiza  too expensive.  Up-to-date on colonoscopy.  Significant improvement on Ibsrela  but was not covered by insurance.  Now with adequate control on MiraLAX  and resolution of symptoms at this time - Continue MiraLAX  and titrate dose based on response - Follow-up with me in a few months to schedule colonoscopy - If symptoms recur prior to follow-up please let me know - Continue increase water, increase fiber, increase exercise   History of colon polyps 3 tubular adenomas on colonoscopy 03/2020 with a 5-year recall - Repeat 03/2025   PVD  Tayler Heiden Mollie RIGGERS [facility] 08/26/2024, 12:14 PM  Cc: Almeda Loa ORN, FNP "

## 2024-08-26 NOTE — Patient Instructions (Addendum)
 Start taking Miralax  1 capful (17 grams) 1x / day for 1 week.   If this is not effective, increase to 1 dose 2x / day for 1 week.   If this is still not effective, increase to two capfuls (34 grams) 2x / day.   Can adjust dose as needed based on response. Can take 1/2 cap daily, skip days, or increase per day.    Follow up in June, You will need to call back for this appointment

## 2024-08-30 ENCOUNTER — Ambulatory Visit: Admitting: Cardiology

## 2024-11-29 ENCOUNTER — Ambulatory Visit: Admitting: Cardiology
